# Patient Record
Sex: Female | Born: 1945 | Race: Black or African American | Hispanic: No | Smoking: Never smoker
Health system: Southern US, Community
[De-identification: ages and names within clinical notes are randomized; demographics above are authoritative.]

## PROBLEM LIST (undated history)

## (undated) DIAGNOSIS — N183 Chronic kidney disease, stage 3 unspecified: Secondary | ICD-10-CM

## (undated) DIAGNOSIS — F32A Depression, unspecified: Secondary | ICD-10-CM

## (undated) DIAGNOSIS — M797 Fibromyalgia: Secondary | ICD-10-CM

## (undated) DIAGNOSIS — E559 Vitamin D deficiency, unspecified: Secondary | ICD-10-CM

## (undated) DIAGNOSIS — R0789 Other chest pain: Secondary | ICD-10-CM

## (undated) DIAGNOSIS — S82891A Other fracture of right lower leg, initial encounter for closed fracture: Secondary | ICD-10-CM

## (undated) DIAGNOSIS — E119 Type 2 diabetes mellitus without complications: Secondary | ICD-10-CM

## (undated) DIAGNOSIS — E785 Hyperlipidemia, unspecified: Secondary | ICD-10-CM

## (undated) DIAGNOSIS — F329 Major depressive disorder, single episode, unspecified: Secondary | ICD-10-CM

## (undated) DIAGNOSIS — I10 Essential (primary) hypertension: Secondary | ICD-10-CM

## (undated) DIAGNOSIS — J45909 Unspecified asthma, uncomplicated: Secondary | ICD-10-CM

## (undated) HISTORY — PX: ABDOMINAL HYSTERECTOMY: SHX81

## (undated) HISTORY — DX: Other chest pain: R07.89

## (undated) HISTORY — DX: Vitamin D deficiency, unspecified: E55.9

## (undated) HISTORY — DX: Hyperlipidemia, unspecified: E78.5

## (undated) HISTORY — DX: Fibromyalgia: M79.7

## (undated) HISTORY — DX: Major depressive disorder, single episode, unspecified: F32.9

## (undated) HISTORY — DX: Chronic kidney disease, stage 3 unspecified: N18.30

## (undated) HISTORY — DX: Depression, unspecified: F32.A

## (undated) HISTORY — DX: Chronic kidney disease, stage 3 (moderate): N18.3

---

## 2003-11-10 ENCOUNTER — Other Ambulatory Visit: Payer: Self-pay

## 2005-08-24 ENCOUNTER — Emergency Department: Payer: Self-pay | Admitting: Emergency Medicine

## 2005-08-25 ENCOUNTER — Other Ambulatory Visit: Payer: Self-pay

## 2005-09-12 ENCOUNTER — Other Ambulatory Visit: Payer: Self-pay

## 2005-09-12 ENCOUNTER — Emergency Department: Payer: Self-pay | Admitting: Emergency Medicine

## 2005-09-19 ENCOUNTER — Ambulatory Visit: Payer: Self-pay | Admitting: Family Medicine

## 2006-02-06 ENCOUNTER — Emergency Department: Payer: Self-pay | Admitting: Emergency Medicine

## 2006-06-03 ENCOUNTER — Ambulatory Visit: Payer: Self-pay | Admitting: Gynecology

## 2006-12-31 ENCOUNTER — Emergency Department: Payer: Self-pay | Admitting: Emergency Medicine

## 2007-04-13 ENCOUNTER — Emergency Department: Payer: Self-pay | Admitting: Unknown Physician Specialty

## 2007-06-10 ENCOUNTER — Other Ambulatory Visit: Payer: Self-pay

## 2007-06-10 ENCOUNTER — Emergency Department: Payer: Self-pay | Admitting: Emergency Medicine

## 2007-07-27 ENCOUNTER — Observation Stay: Payer: Self-pay | Admitting: Internal Medicine

## 2007-07-27 ENCOUNTER — Other Ambulatory Visit: Payer: Self-pay

## 2007-12-04 ENCOUNTER — Ambulatory Visit: Payer: Self-pay | Admitting: Family Medicine

## 2007-12-18 ENCOUNTER — Emergency Department: Payer: Self-pay | Admitting: Emergency Medicine

## 2008-01-01 ENCOUNTER — Emergency Department: Payer: Self-pay | Admitting: Emergency Medicine

## 2008-09-17 ENCOUNTER — Ambulatory Visit: Payer: Self-pay | Admitting: Orthopedic Surgery

## 2008-09-21 ENCOUNTER — Ambulatory Visit: Payer: Self-pay | Admitting: Family Medicine

## 2008-10-04 ENCOUNTER — Ambulatory Visit: Payer: Self-pay | Admitting: Family Medicine

## 2008-10-29 ENCOUNTER — Emergency Department: Payer: Self-pay | Admitting: Emergency Medicine

## 2009-01-07 ENCOUNTER — Emergency Department: Payer: Self-pay | Admitting: Emergency Medicine

## 2009-03-22 ENCOUNTER — Ambulatory Visit: Payer: Self-pay | Admitting: Orthopedic Surgery

## 2009-04-11 ENCOUNTER — Encounter: Payer: Self-pay | Admitting: Orthopedic Surgery

## 2009-05-03 ENCOUNTER — Encounter: Payer: Self-pay | Admitting: Orthopedic Surgery

## 2009-06-03 ENCOUNTER — Encounter: Payer: Self-pay | Admitting: Orthopedic Surgery

## 2009-07-07 ENCOUNTER — Ambulatory Visit: Payer: Self-pay | Admitting: Family Medicine

## 2009-08-05 ENCOUNTER — Ambulatory Visit: Payer: Self-pay | Admitting: Orthopedic Surgery

## 2009-09-13 ENCOUNTER — Ambulatory Visit: Payer: Self-pay | Admitting: Family Medicine

## 2009-09-16 ENCOUNTER — Emergency Department: Payer: Self-pay | Admitting: Emergency Medicine

## 2009-12-20 ENCOUNTER — Ambulatory Visit: Payer: Self-pay | Admitting: Internal Medicine

## 2009-12-20 ENCOUNTER — Inpatient Hospital Stay: Payer: Self-pay | Admitting: Internal Medicine

## 2009-12-29 ENCOUNTER — Ambulatory Visit: Payer: Self-pay | Admitting: Orthopedic Surgery

## 2010-02-23 ENCOUNTER — Emergency Department: Payer: Self-pay | Admitting: Emergency Medicine

## 2010-05-22 ENCOUNTER — Ambulatory Visit: Payer: Self-pay | Admitting: Orthopedic Surgery

## 2010-05-31 ENCOUNTER — Ambulatory Visit: Payer: Self-pay | Admitting: Family Medicine

## 2010-09-21 ENCOUNTER — Emergency Department: Payer: Self-pay | Admitting: Emergency Medicine

## 2010-09-28 ENCOUNTER — Emergency Department: Payer: Self-pay | Admitting: Emergency Medicine

## 2010-12-28 ENCOUNTER — Ambulatory Visit: Payer: Self-pay | Admitting: Family Medicine

## 2011-05-23 ENCOUNTER — Ambulatory Visit: Payer: Self-pay | Admitting: Anesthesiology

## 2011-06-19 ENCOUNTER — Ambulatory Visit: Payer: Self-pay | Admitting: Anesthesiology

## 2011-07-24 ENCOUNTER — Ambulatory Visit: Payer: Self-pay | Admitting: Anesthesiology

## 2011-08-08 ENCOUNTER — Ambulatory Visit: Payer: Self-pay | Admitting: Anesthesiology

## 2011-09-03 ENCOUNTER — Ambulatory Visit: Payer: Self-pay | Admitting: Anesthesiology

## 2011-10-29 ENCOUNTER — Ambulatory Visit: Payer: Self-pay | Admitting: Anesthesiology

## 2012-01-01 ENCOUNTER — Ambulatory Visit: Payer: Self-pay | Admitting: Anesthesiology

## 2012-03-05 HISTORY — PX: HAND SURGERY: SHX662

## 2012-03-12 ENCOUNTER — Ambulatory Visit: Payer: Self-pay | Admitting: Anesthesiology

## 2012-05-30 ENCOUNTER — Emergency Department: Payer: Self-pay | Admitting: Unknown Physician Specialty

## 2012-05-30 LAB — COMPREHENSIVE METABOLIC PANEL
Albumin: 3.7 g/dL (ref 3.4–5.0)
Alkaline Phosphatase: 108 U/L (ref 50–136)
BUN: 26 mg/dL — ABNORMAL HIGH (ref 7–18)
Bilirubin,Total: 0.5 mg/dL (ref 0.2–1.0)
Co2: 26 mmol/L (ref 21–32)
Creatinine: 1.58 mg/dL — ABNORMAL HIGH (ref 0.60–1.30)
EGFR (African American): 39 — ABNORMAL LOW
Glucose: 276 mg/dL — ABNORMAL HIGH (ref 65–99)
Osmolality: 285 (ref 275–301)
Potassium: 4.2 mmol/L (ref 3.5–5.1)
Sodium: 135 mmol/L — ABNORMAL LOW (ref 136–145)

## 2012-05-30 LAB — CBC
MCH: 28.9 pg (ref 26.0–34.0)
MCV: 87 fL (ref 80–100)
Platelet: 295 10*3/uL (ref 150–440)
RDW: 14 % (ref 11.5–14.5)

## 2012-05-30 LAB — LIPASE, BLOOD: Lipase: 124 U/L (ref 73–393)

## 2012-07-01 ENCOUNTER — Emergency Department: Payer: Self-pay | Admitting: Emergency Medicine

## 2012-07-25 ENCOUNTER — Emergency Department: Payer: Self-pay | Admitting: Emergency Medicine

## 2012-11-08 ENCOUNTER — Emergency Department: Payer: Self-pay | Admitting: Internal Medicine

## 2013-01-22 ENCOUNTER — Ambulatory Visit: Payer: Self-pay | Admitting: Family Medicine

## 2013-02-11 DIAGNOSIS — E559 Vitamin D deficiency, unspecified: Secondary | ICD-10-CM | POA: Insufficient documentation

## 2013-03-05 HISTORY — PX: OTHER SURGICAL HISTORY: SHX169

## 2013-03-07 ENCOUNTER — Emergency Department: Payer: Self-pay | Admitting: Emergency Medicine

## 2013-03-07 LAB — CBC WITH DIFFERENTIAL/PLATELET
Basophil #: 0.1 10*3/uL (ref 0.0–0.1)
Basophil %: 0.8 %
EOS ABS: 0.3 10*3/uL (ref 0.0–0.7)
Eosinophil %: 3.1 %
HCT: 40.4 % (ref 35.0–47.0)
HGB: 13.6 g/dL (ref 12.0–16.0)
Lymphocyte #: 3.9 10*3/uL — ABNORMAL HIGH (ref 1.0–3.6)
Lymphocyte %: 38.8 %
MCH: 28.8 pg (ref 26.0–34.0)
MCHC: 33.6 g/dL (ref 32.0–36.0)
MCV: 86 fL (ref 80–100)
Monocyte #: 1 x10 3/mm — ABNORMAL HIGH (ref 0.2–0.9)
Monocyte %: 9.7 %
Neutrophil #: 4.7 10*3/uL (ref 1.4–6.5)
Neutrophil %: 47.6 %
Platelet: 334 10*3/uL (ref 150–440)
RBC: 4.72 10*6/uL (ref 3.80–5.20)
RDW: 14.1 % (ref 11.5–14.5)
WBC: 9.9 10*3/uL (ref 3.6–11.0)

## 2013-03-07 LAB — BASIC METABOLIC PANEL
Anion Gap: 6 — ABNORMAL LOW (ref 7–16)
BUN: 26 mg/dL — ABNORMAL HIGH (ref 7–18)
CALCIUM: 9.2 mg/dL (ref 8.5–10.1)
CHLORIDE: 107 mmol/L (ref 98–107)
CO2: 26 mmol/L (ref 21–32)
Creatinine: 1.64 mg/dL — ABNORMAL HIGH (ref 0.60–1.30)
EGFR (African American): 37 — ABNORMAL LOW
GFR CALC NON AF AMER: 32 — AB
GLUCOSE: 168 mg/dL — AB (ref 65–99)
Osmolality: 286 (ref 275–301)
Potassium: 3.9 mmol/L (ref 3.5–5.1)
SODIUM: 139 mmol/L (ref 136–145)

## 2013-03-07 LAB — TROPONIN I: Troponin-I: <0.02 ng/mL

## 2013-03-07 LAB — RAPID INFLUENZA A&B ANTIGENS

## 2013-03-07 LAB — CK: CK, TOTAL: 198 U/L (ref 21–215)

## 2013-03-28 ENCOUNTER — Emergency Department: Payer: Self-pay | Admitting: Emergency Medicine

## 2013-06-16 ENCOUNTER — Ambulatory Visit: Payer: Self-pay | Admitting: Family Medicine

## 2013-06-17 ENCOUNTER — Ambulatory Visit: Payer: Self-pay | Admitting: Family Medicine

## 2013-07-07 ENCOUNTER — Inpatient Hospital Stay: Payer: Self-pay | Admitting: Internal Medicine

## 2013-07-07 LAB — CBC
HCT: 38 % (ref 35.0–47.0)
HGB: 12.8 g/dL (ref 12.0–16.0)
MCH: 29.3 pg (ref 26.0–34.0)
MCHC: 33.8 g/dL (ref 32.0–36.0)
MCV: 87 fL (ref 80–100)
Platelet: 309 10*3/uL (ref 150–440)
RBC: 4.39 10*6/uL (ref 3.80–5.20)
RDW: 14.5 % (ref 11.5–14.5)
WBC: 24.8 10*3/uL — AB (ref 3.6–11.0)

## 2013-07-07 LAB — URINALYSIS, COMPLETE
Bilirubin,UR: NEGATIVE
Glucose,UR: NEGATIVE mg/dL (ref 0–75)
Ketone: NEGATIVE
Nitrite: NEGATIVE
Ph: 5 (ref 4.5–8.0)
Protein: 30
SPECIFIC GRAVITY: 1.018 (ref 1.003–1.030)
WBC UR: 5 /HPF (ref 0–5)

## 2013-07-07 LAB — COMPREHENSIVE METABOLIC PANEL
ALBUMIN: 3.5 g/dL (ref 3.4–5.0)
ANION GAP: 6 — AB (ref 7–16)
AST: 23 U/L (ref 15–37)
Alkaline Phosphatase: 55 U/L
BILIRUBIN TOTAL: 0.6 mg/dL (ref 0.2–1.0)
BUN: 20 mg/dL — AB (ref 7–18)
Calcium, Total: 9.3 mg/dL (ref 8.5–10.1)
Chloride: 102 mmol/L (ref 98–107)
Co2: 28 mmol/L (ref 21–32)
Creatinine: 1.73 mg/dL — ABNORMAL HIGH (ref 0.60–1.30)
EGFR (African American): 34 — ABNORMAL LOW
EGFR (Non-African Amer.): 30 — ABNORMAL LOW
Glucose: 87 mg/dL (ref 65–99)
Osmolality: 274 (ref 275–301)
POTASSIUM: 3.2 mmol/L — AB (ref 3.5–5.1)
SGPT (ALT): 16 U/L (ref 12–78)
SODIUM: 136 mmol/L (ref 136–145)
TOTAL PROTEIN: 7.9 g/dL (ref 6.4–8.2)

## 2013-07-07 LAB — DRUG SCREEN, URINE
Amphetamines, Ur Screen: NEGATIVE (ref ?–1000)
BENZODIAZEPINE, UR SCRN: NEGATIVE (ref ?–200)
Barbiturates, Ur Screen: NEGATIVE (ref ?–200)
Cannabinoid 50 Ng, Ur ~~LOC~~: NEGATIVE (ref ?–50)
Cocaine Metabolite,Ur ~~LOC~~: POSITIVE (ref ?–300)
MDMA (Ecstasy)Ur Screen: NEGATIVE (ref ?–500)
Methadone, Ur Screen: NEGATIVE (ref ?–300)
Opiate, Ur Screen: NEGATIVE (ref ?–300)
Phencyclidine (PCP) Ur S: NEGATIVE (ref ?–25)
Tricyclic, Ur Screen: NEGATIVE (ref ?–1000)

## 2013-07-07 LAB — ETHANOL: Ethanol %: <0.003 % (ref 0.000–0.080)

## 2013-07-08 LAB — CBC WITH DIFFERENTIAL/PLATELET
Basophil #: 0 10*3/uL (ref 0.0–0.1)
Basophil %: 0.2 %
EOS ABS: 0 10*3/uL (ref 0.0–0.7)
EOS PCT: 0.2 %
HCT: 34.7 % — AB (ref 35.0–47.0)
HGB: 11.6 g/dL — ABNORMAL LOW (ref 12.0–16.0)
LYMPHS ABS: 2.7 10*3/uL (ref 1.0–3.6)
Lymphocyte %: 13 %
MCH: 29.4 pg (ref 26.0–34.0)
MCHC: 33.5 g/dL (ref 32.0–36.0)
MCV: 88 fL (ref 80–100)
Monocyte #: 1.6 x10 3/mm — ABNORMAL HIGH (ref 0.2–0.9)
Monocyte %: 7.5 %
Neutrophil #: 16.4 10*3/uL — ABNORMAL HIGH (ref 1.4–6.5)
Neutrophil %: 79.1 %
Platelet: 274 10*3/uL (ref 150–440)
RBC: 3.94 10*6/uL (ref 3.80–5.20)
RDW: 14.5 % (ref 11.5–14.5)
WBC: 20.8 10*3/uL — AB (ref 3.6–11.0)

## 2013-07-08 LAB — BASIC METABOLIC PANEL
Anion Gap: 9 (ref 7–16)
BUN: 25 mg/dL — ABNORMAL HIGH (ref 7–18)
CHLORIDE: 102 mmol/L (ref 98–107)
CO2: 26 mmol/L (ref 21–32)
Calcium, Total: 8.7 mg/dL (ref 8.5–10.1)
Creatinine: 2.89 mg/dL — ABNORMAL HIGH (ref 0.60–1.30)
EGFR (Non-African Amer.): 16 — ABNORMAL LOW
GFR CALC AF AMER: 18 — AB
GLUCOSE: 64 mg/dL — AB (ref 65–99)
Osmolality: 276 (ref 275–301)
Potassium: 2.9 mmol/L — ABNORMAL LOW (ref 3.5–5.1)
SODIUM: 137 mmol/L (ref 136–145)

## 2013-07-08 LAB — CSF CELL CT + PROT + GLU PANEL
CSF TUBE #: 3
Eosinophil: 0 %
Glucose, CSF: 38 mg/dL — ABNORMAL LOW (ref 40–75)
Lymphocytes: 65 %
MONOCYTES/MACROPHAGES: 35 %
Neutrophils: 0 %
Other Cells: 0 %
Protein, CSF: 33 mg/dL (ref 15–45)
RBC (CSF): 14 /mm3
WBC (CSF): 5 /mm3

## 2013-07-09 LAB — CBC WITH DIFFERENTIAL/PLATELET
BASOS PCT: 0.1 %
Basophil #: 0 10*3/uL (ref 0.0–0.1)
Eosinophil #: 0 10*3/uL (ref 0.0–0.7)
Eosinophil %: 0.1 %
HCT: 35.5 % (ref 35.0–47.0)
HGB: 12 g/dL (ref 12.0–16.0)
LYMPHS ABS: 2.2 10*3/uL (ref 1.0–3.6)
LYMPHS PCT: 10.4 %
MCH: 29.4 pg (ref 26.0–34.0)
MCHC: 33.7 g/dL (ref 32.0–36.0)
MCV: 87 fL (ref 80–100)
MONO ABS: 1.6 x10 3/mm — AB (ref 0.2–0.9)
MONOS PCT: 7.8 %
Neutrophil #: 17 10*3/uL — ABNORMAL HIGH (ref 1.4–6.5)
Neutrophil %: 81.6 %
Platelet: 287 10*3/uL (ref 150–440)
RBC: 4.06 10*6/uL (ref 3.80–5.20)
RDW: 14.4 % (ref 11.5–14.5)
WBC: 20.9 10*3/uL — AB (ref 3.6–11.0)

## 2013-07-09 LAB — BASIC METABOLIC PANEL
ANION GAP: 6 — AB (ref 7–16)
BUN: 27 mg/dL — ABNORMAL HIGH (ref 7–18)
CALCIUM: 8.4 mg/dL — AB (ref 8.5–10.1)
CO2: 27 mmol/L (ref 21–32)
Chloride: 106 mmol/L (ref 98–107)
Glucose: 42 mg/dL — ABNORMAL LOW (ref 65–99)
OSMOLALITY: 280 (ref 275–301)
Potassium: 3.3 mmol/L — ABNORMAL LOW (ref 3.5–5.1)
SODIUM: 139 mmol/L (ref 136–145)

## 2013-07-09 LAB — CREATININE, SERUM
Creatinine: 1.91 mg/dL — ABNORMAL HIGH (ref 0.60–1.30)
EGFR (African American): 30 — ABNORMAL LOW
EGFR (Non-African Amer.): 26 — ABNORMAL LOW

## 2013-07-10 LAB — CREATININE, SERUM
Creatinine: 1.6 mg/dL — ABNORMAL HIGH (ref 0.60–1.30)
EGFR (African American): 38 — ABNORMAL LOW
GFR CALC NON AF AMER: 33 — AB

## 2013-07-10 LAB — CBC WITH DIFFERENTIAL/PLATELET
Basophil #: 0.1 10*3/uL (ref 0.0–0.1)
Basophil %: 1.1 %
EOS ABS: 0.1 10*3/uL (ref 0.0–0.7)
Eosinophil %: 0.8 %
HCT: 32.1 % — ABNORMAL LOW (ref 35.0–47.0)
HGB: 10.5 g/dL — AB (ref 12.0–16.0)
LYMPHS ABS: 2.4 10*3/uL (ref 1.0–3.6)
LYMPHS PCT: 17.7 %
MCH: 29 pg (ref 26.0–34.0)
MCHC: 32.8 g/dL (ref 32.0–36.0)
MCV: 88 fL (ref 80–100)
Monocyte #: 1.4 x10 3/mm — ABNORMAL HIGH (ref 0.2–0.9)
Monocyte %: 10 %
Neutrophil #: 9.7 10*3/uL — ABNORMAL HIGH (ref 1.4–6.5)
Neutrophil %: 70.4 %
Platelet: 303 10*3/uL (ref 150–440)
RBC: 3.63 10*6/uL — ABNORMAL LOW (ref 3.80–5.20)
RDW: 14.3 % (ref 11.5–14.5)
WBC: 13.7 10*3/uL — ABNORMAL HIGH (ref 3.6–11.0)

## 2013-07-12 LAB — CULTURE, BLOOD (SINGLE)

## 2013-07-12 LAB — CSF CULTURE

## 2013-07-24 DIAGNOSIS — F1410 Cocaine abuse, uncomplicated: Secondary | ICD-10-CM | POA: Insufficient documentation

## 2013-07-24 HISTORY — DX: Cocaine abuse, uncomplicated: F14.10

## 2013-09-24 DIAGNOSIS — E782 Mixed hyperlipidemia: Secondary | ICD-10-CM | POA: Insufficient documentation

## 2013-09-24 DIAGNOSIS — G8929 Other chronic pain: Secondary | ICD-10-CM | POA: Insufficient documentation

## 2013-09-24 DIAGNOSIS — K219 Gastro-esophageal reflux disease without esophagitis: Secondary | ICD-10-CM | POA: Insufficient documentation

## 2013-10-21 ENCOUNTER — Emergency Department: Payer: Self-pay | Admitting: Emergency Medicine

## 2013-12-10 ENCOUNTER — Emergency Department: Payer: Self-pay | Admitting: Emergency Medicine

## 2014-02-19 ENCOUNTER — Ambulatory Visit: Payer: Self-pay | Admitting: Nurse Practitioner

## 2014-06-21 DIAGNOSIS — G3184 Mild cognitive impairment, so stated: Secondary | ICD-10-CM

## 2014-06-21 HISTORY — DX: Mild cognitive impairment of uncertain or unknown etiology: G31.84

## 2014-06-26 NOTE — H&P (Signed)
PATIENT NAME:  Theresa Booker, LESEMAN MR#:  [id number] DATE OF BIRTH:  [birth year]  DATE OF ADMISSION:  07/07/2013  PRIMARY CARE PHYSICIAN:  Dr. Wynona Meals   REFERRING PHYSICIAN: Dr. Francene Castle  CHIEF COMPLAINT: Seizures.   HISTORY OF PRESENT ILLNESS: The patient is a 69 year old African American female with history of hypertension, hyperlipidemia, diabetes mellitus, cocaine use, seizures, remembers opening the door for her neighbor.  Neighbor found her having generalized tonic-clonic seizures. Concerning this, EMS was called and was brought to the Emergency Department. Work-up in the Emergency Department, the patient was found to have fever of 101.2, tachycardic. The patient is found to have white blood cell count of 25,000. Chest x-ray and urinalysis unremarkable.  CT head without contrast did not show any obvious abnormality. Could not obtain any history from the patient as the patient is quite somnolent.   PAST MEDICAL HISTORY:  1. Diabetes mellitus type 2.  2. Gastroesophageal reflux disease.  3. Hypertension.  4. Hyperlipidemia.  5. Chronic back pain.  6. Asthma.  7. Chronic kidney disease.   PAST SURGICAL HISTORY:  1. Right knee surgery.  2. Rotator cuff surgery.  3. Hysterectomy.   ALLERGIES: No known drug allergies.   HOME MEDICATIONS:  1. ProAir 2 puffs every 4 hours. . 3. Omeprazole 20 mg once a day.  4. Losartan 25 mg a day once a day.  5. Lotensin 10 mg a day.  6. Lantus 43 units once a day.  7. Januvia 50 mg daily.  8. Glipizide 10 mg 2 times a day.  9. Aspirin 81 mg daily.   SOCIAL HISTORY: The patient states lives by herself. could not obtain social history, family history, review of systems as patient has been falling asleep frequently.   PHYSICAL EXAMINATION:  GENERAL: This is a well-built, well-nourished, age-appropriate female lying down in the bed, somnolent.  VITAL SIGNS: Temperature 101.2, pulse 82, blood pressure 122/81, respiratory rate of 18, oxygen  saturation is 96% on room air.   HEENT: Head normocephalic, atraumatic. No scleral icterus.  Conjunctivae normal. Pupils equal, round, and react to light. I could not examine the extraocular movements. Could not examine oropharynx. Was not cooperative to examine the oropharynx.  NECK: Supple. No lymphadenopathy. No JVD. No carotid bruit. No thyromegaly.  CHEST: Has no focal tenderness.  Lungs bilaterally clear to auscultation.    HEART: S1 and S2 regular, tachycardia.  ABDOMEN: Bowel sounds plus soft, nontender, nondistended. Could not appreciate any hepatosplenomegaly.  EXTREMITIES: No pedal edema. Pulses 2+.  NEUROLOGIC: The patient is somnolent,, arousable. Answers simple questions appropriately. No apparent cranial nerve abnormalities. Could not examine the motor and sensory as the patient is somnolent.  SKIN: No rash or lesions.  MUSCULOSKELETAL: Could not examine the patient is somnolent.   LABORATORY AND RADIOLOGICAL DATA:  CT head without contrast: No acute intracranial abnormality.  Chest x-ray, one view portable: No acute process.  UA negative for nitrites and leukocyte esterase. CMP is complete. BUN 20, creatinine of 1.73.  The rest of all the values are within normal limits.  CBC: WBC of 25,000, hemoglobin 12.8, platelet count of 319.000.   ASSESSMENT AND PLAN:  The patient is a 69 year old female with a known history of cocaine abuse, seizure disorder, comes to the Emergency Department after having an episode of seizures.  1. [place] Seizures.  No previous history of seizures. Most likely cocaine might have decreased the threshold for the seizures.  Get EEG.  Keep the patient on Keppra. Keep  the patient on seizure precautions.  2. Diabetes mellitus, insulin-dependent. Continue the home medications, on glipizide, Januvia, and Lantus.   3. Hypertension: Continue with losartan.  4. Cocaine abuse. Will need further counseling with the patient.  5. Patient on deep vein thrombosis  prophylaxis with Lovenox.   TIME SPENT: 50 minutes.    ____________________________ Monica Becton, MD pv:dd D: 07/07/2013 21:39:18 ET T: 07/07/2013 22:49:39 ET JOB#: [id number]  cc: Monica Becton, MD, <Dictator> Monica Becton MD ELECTRONICALLY SIGNED 07/08/2013 4:09

## 2014-06-26 NOTE — Discharge Summary (Signed)
PATIENT NAME:  Theresa Booker, Theresa Booker MR#:  [id number] DATE OF BIRTH:  07/14/1945  DATE OF ADMISSION:  07/07/2013  DATE OF DISCHARGE:  07/10/2013  PRIMARY CARE PHYSICIAN: Dr. Tomasita Morrow  DISCHARGE DIAGNOSES: 1.  Syncope due to fever.  2.  Viral fever.  3.  Hypertension.  4.  Diabetes mellitus, type 2.  5.  Hyperlipidemia.  6.  Acute-on-chronic renal failure with chronic kidney disease, stage3.   7.  Hypokalemia.   DISCHARGE MEDICATIONS:  1.  Januvia 50 mg p.o. daily.  2.  Glipizide 10 mg p.o. b.i.d.  3.  Losartan 25 mg p.o. daily.  4.  lotensin 10 mg p.o. daily.  5.  Loratadine 10 mg p.o. daily.  6.  Aspirin 81 mg p.o. daily. 7.  Lantus 100 units/mL, 43 units at bedtime.  8.  ProAir 2 puffs four times daily.   DIET: Mechanical soft diet with thin liquids.    CONSULTATIONS: None.   HOSPITAL COURSE: Syncope. A 69 year old female patient who lives alone, brought in because of possible seizure.  > generalized seizure as documented in the history and physical, but upon further questioning with the patient's neighbors, they found her in a passed out kind of state. The patient was brought in by EMS. The patient was found to have fever of 101.2, and she was tachycardic, with white count of 25,000. The patient's chest x-ray and CT head were unremarkable. The patient was lethargic on admission. The patient was admitted to hospitalist service for possible new-onset seizure. The patient was given Keppra and seizure precautions were followed, but the patient did have syncope instead of seizures. Based on further questioning with the patient, she said that she could not eat for 3 or 4 days because she was feeling sick and nauseous, then suddenly passed out. Her urine toxicology was positive for cocaine. She told me that she never used cocaine before, so I believe it is a combination of taking the cocaine and not feeling well with fever, she might have passed out. She did not have further seizures here,  and she has no history of seizures. The patient did have a lumbar puncture because of the fever on initial presentation, but the lumbar puncture did not show any growth, and the patient was kept initially n.p.o. because of her mental confusion and lethargy. We gave her fluids and IV antibiotics. The patient also was using cocaine, so we thought maybe her altered mental status is secondary to cocaine as well. Speech saw the patient, initially kept n.p.o. Later on, her mental status improved nicely, was able to talk and communicate. The patient was started on a dysphagia diet. Blood cultures have been negative, and the patient's white count also improved to 13.8. I explained that she should not use drugs any more and follow up with Dr. Tomasita Morrow as an outpatient. The patient's potassium also was replaced, and the patient had insulin pack. The patient went home in stable condition.   LABORATORY DATA DURING HOSPITAL STAY: EKG showed sinus tachycardia, 105 beats per minute on admission. Blood cultures negative. Urine toxicology positive for cocaine. White count on admission 24.8, hemoglobin 12.8, hematocrit 38, platelets 309. Electrolytes: Sodium 136, potassium 3.0, chloride is 102, bicarb 28, BUN 20, creatinine 1.73, glucose 87. The patient's CSF cultures were negative.   DISCHARGE VITAL SIGNS: Temperature 98.8, heart rate 72, blood pressure 130/90. Hr 74, sat 98% on room air.    DISCHARGE PHYSICAL EXAMINATION:  GENERAL: Alert, awake, oriented.  HEAD: Normocephalic, atraumatic.  EYES: Pupils equal, reacting to light. Extraocular movements are intact.  ENT: No tympanic membrane congestion. No turbinate hypertrophy.no oropharyngeal erythema.Marland Kitchen NECK: Supple. No JVD. No carotid bruit.  NEUROLOGIC: Exam was within normal limits.   The patient did receive vancomycin and Zosyn while she was here, but we did not find a reason for elevated white count. X-rays negative, cultures were negative, so I stopped the  antibiotics. She did not go home with any antibiotics.   TIME SPENT: More than 30 minutes.    ____________________________ Epifanio Lesches, MD sk:mr D: 07/12/2013 08:31:00 ET T: 07/12/2013 20:40:32 ET JOB#: [id number]  cc: Epifanio Lesches, MD, <Dictator> Epifanio Lesches MD ELECTRONICALLY SIGNED 07/28/2013 22:20

## 2014-06-27 NOTE — H&P (Signed)
PATIENT NAME:  Theresa Booker MR#:  [id number] DATE OF BIRTH:  05-02-1945  DATE OF ADMISSION:  05/23/2011  CHIEF COMPLAINT: Diffuse body pain.   PROCEDURE: None.   HISTORY OF PRESENT ILLNESS: Theresa Booker is a pleasant 69 year old black female with a long-standing history of diffuse body pain present for over two years referred by [facility] for evaluation and treatment. She has most recently seen Dr. Delight Stare who has evaluated her for rheumatoid arthritis and degenerative arthritis and she had a negative rheumatoid arthritis and autoimmune work-up. She has persistent diffuse pain involving the shoulders, front of the shoulders, back of the shoulders, wrists, elbows, knees, and lower legs. She states that the pain is present throughout the day, has gradually gotten worse and is not influenced by time of day. It is worse with sitting, standing, squatting, twisting, and better with sleeping and pain medication. She has been on Nucynta in the past via [facility] that gave her relief but caused nausea and vomiting. She has also been on Cymbalta and she discontinued this recently. This did not help. She has been on a trial of other medications without significant improvement and failed these conservative therapies. She also has associated nighttime cramps, fatigue, inability to concentrate, and problems with her bladder. She also has occasional and intermittent weakness affecting the lower extremities when she is trying to be up and mobile. She describes the pain as cramping, deep, disabling, and distressing.   PAST MEDICAL HISTORY:  1. History of abnormal heart rhythm, high blood pressure, and chest pain.  2. History of bronchitis and shortness of breath.  3. History of reflux. 4. History of kidney disease. 5. History of diabetes.  6. Renal insufficiency. 7. Atypical chest pain. 8. Depression.   PAST SURGICAL HISTORY:  1. Vaginal hysterectomy. 2. Left shoulder surgery. 3. Right knee scope.    SOCIAL HISTORY: She is divorced with one child, never smoked. She does not use alcohol. She denies any addiction history or medication misuse.   FAMILY HISTORY: Significant for chronic pain, cancer, diabetes, and high blood pressure.   PHYSICAL EXAMINATION: Physical examination reveals a black female who appears in no acute distress. She is alert and oriented x3, cooperative and compliant. Heart has regular rate and rhythm without murmur. Lungs are clear to auscultation without wheezes or rales. Pupils are equally round and reactive to light. Extraocular muscles are intact. Lower extremity strength appears to be stable. She is able to ambulate, but does have a slightly antalgic gait. Her muscle tone and bulk is good. She has tenderness over the anterior clavicular region as well as in the posterior trapezius, front of the thighs, and tenderness around the dorsum of the wrists bilaterally and lower extremities. She has tenderness in the low back region.   ASSESSMENT:  1. Diffuse body pain with intractable pain and fibromyalgia.  2. Diffuse degenerative arthritis.  3. Depression.   PLAN: I have had a long discussion with Ms. Rabine regarding her care and she has failed conservative therapy. She has been through [place] with some side effects and has failed more conservative therapy, in addition to SSRIs. I have discussed this with her [facility] physician and feel that a low dose of Duragesic would be appropriate. We will start her on 25 mcg patch. I have gone over the risks, benefits, and benefits of the medication with her in full detail. We will have her return to the Clinic in one month for reevaluation and we will  see her for approximately 3 to 4 months to try to get her on a stable regimen and refer her back to the [facility]. ____________________________ Alvina Filbert. Andree Elk, MD jga:slb D: 05/23/2011 15:39:17 ET     T: 05/23/2011 16:32:44 ET        JOB#: [id number] cc: Alvina Filbert.  Andree Elk, MD, <Dictator> Marguerita Merles, MD Alvina Filbert Abdulraheem Pineo MD ELECTRONICALLY SIGNED 05/29/2011 10:04

## 2014-08-12 ENCOUNTER — Other Ambulatory Visit: Payer: Self-pay | Admitting: Nurse Practitioner

## 2014-08-12 ENCOUNTER — Ambulatory Visit
Admission: RE | Admit: 2014-08-12 | Discharge: 2014-08-12 | Disposition: A | Discharge status: Home / Self Care | Payer: Medicare Other | Source: Ambulatory Visit | Attending: Nurse Practitioner | Admitting: Nurse Practitioner

## 2014-08-12 DIAGNOSIS — M1711 Unilateral primary osteoarthritis, right knee: Secondary | ICD-10-CM | POA: Insufficient documentation

## 2014-08-12 DIAGNOSIS — M25561 Pain in right knee: Secondary | ICD-10-CM

## 2014-09-12 ENCOUNTER — Encounter: Payer: Self-pay | Admitting: Emergency Medicine

## 2014-09-12 ENCOUNTER — Emergency Department: Payer: Medicare Other

## 2014-09-12 ENCOUNTER — Emergency Department
Admission: EM | Admit: 2014-09-12 | Discharge: 2014-09-12 | Disposition: A | Discharge status: Home / Self Care | Payer: Medicare Other | Attending: Emergency Medicine | Admitting: Emergency Medicine

## 2014-09-12 DIAGNOSIS — I10 Essential (primary) hypertension: Secondary | ICD-10-CM | POA: Diagnosis not present

## 2014-09-12 DIAGNOSIS — M1712 Unilateral primary osteoarthritis, left knee: Secondary | ICD-10-CM | POA: Insufficient documentation

## 2014-09-12 DIAGNOSIS — E119 Type 2 diabetes mellitus without complications: Secondary | ICD-10-CM | POA: Insufficient documentation

## 2014-09-12 DIAGNOSIS — M25561 Pain in right knee: Secondary | ICD-10-CM | POA: Diagnosis present

## 2014-09-12 HISTORY — DX: Essential (primary) hypertension: I10

## 2014-09-12 HISTORY — DX: Type 2 diabetes mellitus without complications: E11.9

## 2014-09-12 MED ORDER — MELOXICAM 7.5 MG PO TABS: 7.5000 mg | ORAL_TABLET | Freq: Every day | ORAL | Status: DC

## 2014-09-12 MED ORDER — TRAMADOL HCL 50 MG PO TABS: 50.0000 mg | ORAL_TABLET | Freq: Four times a day (QID) | ORAL | Status: DC | PRN

## 2014-09-12 NOTE — ED Provider Notes (Signed)
[facility] Emergency Department Provider Note  ____________________________________________  Time seen: Approximately 3:48 PM  I have reviewed the triage vital signs and the nursing notes.   HISTORY  Chief Complaint Knee Pain    HPI Theresa Booker is a 69 y.o. female patient complain of pain and right medial knee swelling for 2 days. Patient stated there is no history of trauma. Patient states she ambulates with the use of a cane secondary to her chronic right knee problems. Patient says that all through is already discussed knee replacement for the right knee. She is rating his new onset of left knee pain is 8/10. Patient denies any dyspnea or chest pain.   Past Medical History  Diagnosis Date  . Diabetes mellitus without complication   . Hypertension     There are no active problems to display for this patient.   History reviewed. No pertinent past surgical history.  Current Outpatient Rx  Name  Route  Sig  Dispense  Refill  . meloxicam (MOBIC) 7.5 MG tablet   Oral   Take 1 tablet (7.5 mg total) by mouth daily.   10 tablet   2   . traMADol (ULTRAM) 50 MG tablet   Oral   Take 1 tablet (50 mg total) by mouth every 6 (six) hours as needed.   20 tablet   0     Allergies Review of patient's allergies indicates no known allergies.  No family history on file.  Social History History  Substance Use Topics  . Smoking status: Never Smoker   . Smokeless tobacco: Not on file  . Alcohol Use: Not on file    Review of Systems Constitutional: No fever/chills Eyes: No visual changes. ENT: No sore throat. Cardiovascular: Denies chest pain. Respiratory: Denies shortness of breath. Gastrointestinal: No abdominal pain.  No nausea, no vomiting.  No diarrhea.  No constipation. Genitourinary: Negative for dysuria. Musculoskeletal: Negative for back pain. Left medial knee pain. Skin: Negative for rash. Neurological: Negative for headaches, focal  weakness or numbness. Endocrine: Hypertension and diabetes. 10-point ROS otherwise negative.  ____________________________________________   PHYSICAL EXAM:  VITAL SIGNS: ED Triage Vitals  Enc Vitals Group     BP 09/12/14 1520 124/87 mmHg     Pulse Rate 09/12/14 1520 74     Resp 09/12/14 1520 20     Temp 09/12/14 1520 97.9 F (36.6 C)     Temp Source 09/12/14 1520 Oral     SpO2 09/12/14 1520 95 %     Weight 09/12/14 1520 160 lb (72.576 kg)     Height 09/12/14 1520 5' (1.524 m)     Head Cir --      Peak Flow --      Pain Score 09/12/14 1520 8     Pain Loc --      Pain Edu? --      Excl. in [place]? --   Constitutional: Alert and oriented. Well appearing and in no acute distress. Eyes: Conjunctivae are normal. PERRL. EOMI. Head: Atraumatic. Nose: No congestion/rhinnorhea. Mouth/Throat: Mucous membranes are moist.  Oropharynx non-erythematous. Neck: No stridor.  No cervical spine tenderness to palpation. Hematological/Lymphatic/Immunilogical: No cervical lymphadenopathy. Cardiovascular: Normal rate, regular rhythm. Grossly normal heart sounds.  Good peripheral circulation. Respiratory: Normal respiratory effort.  No retractions. Lungs CTAB. Gastrointestinal: Soft and nontender. No distention. No abdominal bruits. No CVA tenderness. Musculoskeletal: No obvious deformity of the left knee. Medial swelling. Tender palpation at sensation point of the medial collateral ligament. Patient  also has some guarding at the femoral patella joint. Patient had passive full nuchal range of motion. Moderate crepitus at the anterior patella. Neurologic:  Normal speech and language. No gross focal neurologic deficits are appreciated. Speech is normal. No gait instability. Skin:  Skin is warm, dry and intact. No rash noted. Psychiatric: Mood and affect are normal. Speech and behavior are normal.  ____________________________________________   LABS (all labs ordered are listed, but only abnormal  results are displayed)  Labs Reviewed - No data to display ____________________________________________  EKG   ____________________________________________  RADIOLOGY  No acute findings some moderate joint space narrowing on the medial aspect of the femoral tibial joint space. I, Sable Feil, personally viewed and evaluated these images as part of my medical decision making.   ____________________________________________   PROCEDURES  Procedure(s) performed: None  Critical Care performed: No  ____________________________________________   INITIAL IMPRESSION / ASSESSMENT AND PLAN / ED COURSE  Pertinent labs & imaging results that were available during my care of the patient were reviewed by me and considered in my medical decision making (see chart for details).  Left medial knee pain. Advised patient to follow orthopedic doctor who is Dr.Menz. Advised patient continued to ambulate with support. Patient given prescription 7.5 mg of morbid take as directed. Patient also given a prescription for tramadol to take as needed for acute pain. ____________________________________________   FINAL CLINICAL IMPRESSION(S) / ED DIAGNOSES  Final diagnoses:  Primary osteoarthritis of left knee      Sable Feil, PA-C 09/12/14 [place], PA-C 09/12/14 1644  Hinda Kehr, MD 09/12/14 2347

## 2014-09-12 NOTE — Discharge Instructions (Signed)
Arthritis, Nonspecific °Arthritis is inflammation of a joint. This usually means pain, redness, warmth or swelling are present. One or more joints may be involved. There are a number of types of arthritis. Your caregiver may not be able to tell what type of arthritis you have right away. °CAUSES  °The most common cause of arthritis is the wear and tear on the joint (osteoarthritis). This causes damage to the cartilage, which can break down over time. The knees, hips, back and neck are most often affected by this type of arthritis. °Other types of arthritis and common causes of joint pain include: °· Sprains and other injuries near the joint. Sometimes minor sprains and injuries cause pain and swelling that develop hours later. °· Rheumatoid arthritis. This affects hands, feet and knees. It usually affects both sides of your body at the same time. It is often associated with chronic ailments, fever, weight loss and general weakness. °· Crystal arthritis. Gout and pseudo gout can cause occasional acute severe pain, redness and swelling in the foot, ankle, or knee. °· Infectious arthritis. Bacteria can get into a joint through a break in overlying skin. This can cause infection of the joint. Bacteria and viruses can also spread through the blood and affect your joints. °· Drug, infectious and allergy reactions. Sometimes joints can become mildly painful and slightly swollen with these types of illnesses. °SYMPTOMS  °· Pain is the main symptom. °· Your joint or joints can also be red, swollen and warm or hot to the touch. °· You may have a fever with certain types of arthritis, or even feel overall ill. °· The joint with arthritis will hurt with movement. Stiffness is present with some types of arthritis. °DIAGNOSIS  °Your caregiver will suspect arthritis based on your description of your symptoms and on your exam. Testing may be needed to find the type of arthritis: °· Blood and sometimes urine tests. °· X-ray tests  and sometimes CT or MRI scans. °· Removal of fluid from the joint (arthrocentesis) is done to check for bacteria, crystals or other causes. Your caregiver (or a specialist) will numb the area over the joint with a local anesthetic, and use a needle to remove joint fluid for examination. This procedure is only minimally uncomfortable. °· Even with these tests, your caregiver may not be able to tell what kind of arthritis you have. Consultation with a specialist (rheumatologist) may be helpful. °TREATMENT  °Your caregiver will discuss with you treatment specific to your type of arthritis. If the specific type cannot be determined, then the following general recommendations may apply. °Treatment of severe joint pain includes: °· Rest. °· Elevation. °· Anti-inflammatory medication (for example, ibuprofen) may be prescribed. Avoiding activities that cause increased pain. °· Only take over-the-counter or prescription medicines for pain and discomfort as recommended by your caregiver. °· Cold packs over an inflamed joint may be used for 10 to 15 minutes every hour. Hot packs sometimes feel better, but do not use overnight. Do not use hot packs if you are diabetic without your caregiver's permission. °· A cortisone shot into arthritic joints may help reduce pain and swelling. °· Any acute arthritis that gets worse over the next 1 to 2 days needs to be looked at to be sure there is no joint infection. °Long-term arthritis treatment involves modifying activities and lifestyle to reduce joint stress jarring. This can include weight loss. Also, exercise is needed to nourish the joint cartilage and remove waste. This helps keep the muscles   around the joint strong. °HOME CARE INSTRUCTIONS  °· Do not take aspirin to relieve pain if gout is suspected. This elevates uric acid levels. °· Only take over-the-counter or prescription medicines for pain, discomfort or fever as directed by your caregiver. °· Rest the joint as much as  possible. °· If your joint is swollen, keep it elevated. °· Use crutches if the painful joint is in your leg. °· Drinking plenty of fluids may help for certain types of arthritis. °· Follow your caregiver's dietary instructions. °· Try low-impact exercise such as: °¨ Swimming. °¨ Water aerobics. °¨ Biking. °¨ Walking. °· Morning stiffness is often relieved by a warm shower. °· Put your joints through regular range-of-motion. °SEEK MEDICAL CARE IF:  °· You do not feel better in 24 hours or are getting worse. °· You have side effects to medications, or are not getting better with treatment. °SEEK IMMEDIATE MEDICAL CARE IF:  °· You have a fever. °· You develop severe joint pain, swelling or redness. °· Many joints are involved and become painful and swollen. °· There is severe back pain and/or leg weakness. °· You have loss of bowel or bladder control. °Document Released: 03/29/2004 Document Revised: 05/14/2011 Document Reviewed: 04/14/2008 °Exit[employer]® Patient Information ©2015 [employer]. This information is not intended to replace advice given to you by your health care provider. Make sure you discuss any questions you have with your health care provider. ° °

## 2014-09-12 NOTE — ED Notes (Signed)
AAOx3.  Skin warm and dry.  NAD.  Ambulates with steady gait, uses cane.  D/C home

## 2014-09-12 NOTE — ED Notes (Signed)
C/o right knee pain and swelling.  Denies injury. Slight Swelling noted to right knee medially.

## 2014-09-12 NOTE — ED Notes (Signed)
Patient presents to the ED with right knee swelling, tenderness and pain x 2 days.  Patient denies any known injury to her knee.  Patient is in no obvious distress at this time.  Walking with a cane.  Patient denies any history of blood clots.

## 2014-10-18 DIAGNOSIS — R413 Other amnesia: Secondary | ICD-10-CM | POA: Insufficient documentation

## 2014-11-30 ENCOUNTER — Emergency Department
Admission: EM | Admit: 2014-11-30 | Discharge: 2014-11-30 | Disposition: A | Discharge status: Home / Self Care | Payer: Medicare Other | Attending: Emergency Medicine | Admitting: Emergency Medicine

## 2014-11-30 ENCOUNTER — Emergency Department: Payer: Medicare Other

## 2014-11-30 ENCOUNTER — Encounter: Payer: Self-pay | Admitting: Emergency Medicine

## 2014-11-30 DIAGNOSIS — S299XXA Unspecified injury of thorax, initial encounter: Secondary | ICD-10-CM | POA: Diagnosis not present

## 2014-11-30 DIAGNOSIS — Y998 Other external cause status: Secondary | ICD-10-CM | POA: Insufficient documentation

## 2014-11-30 DIAGNOSIS — S76011A Strain of muscle, fascia and tendon of right hip, initial encounter: Secondary | ICD-10-CM

## 2014-11-30 DIAGNOSIS — Y92410 Unspecified street and highway as the place of occurrence of the external cause: Secondary | ICD-10-CM | POA: Diagnosis not present

## 2014-11-30 DIAGNOSIS — S161XXA Strain of muscle, fascia and tendon at neck level, initial encounter: Secondary | ICD-10-CM | POA: Insufficient documentation

## 2014-11-30 DIAGNOSIS — S46911A Strain of unspecified muscle, fascia and tendon at shoulder and upper arm level, right arm, initial encounter: Secondary | ICD-10-CM | POA: Insufficient documentation

## 2014-11-30 DIAGNOSIS — S8001XA Contusion of right knee, initial encounter: Secondary | ICD-10-CM | POA: Diagnosis not present

## 2014-11-30 DIAGNOSIS — Z791 Long term (current) use of non-steroidal anti-inflammatories (NSAID): Secondary | ICD-10-CM | POA: Insufficient documentation

## 2014-11-30 DIAGNOSIS — Y9389 Activity, other specified: Secondary | ICD-10-CM | POA: Diagnosis not present

## 2014-11-30 DIAGNOSIS — I10 Essential (primary) hypertension: Secondary | ICD-10-CM | POA: Insufficient documentation

## 2014-11-30 DIAGNOSIS — S199XXA Unspecified injury of neck, initial encounter: Secondary | ICD-10-CM | POA: Diagnosis present

## 2014-11-30 DIAGNOSIS — E119 Type 2 diabetes mellitus without complications: Secondary | ICD-10-CM | POA: Diagnosis not present

## 2014-11-30 MED ORDER — ONDANSETRON 4 MG PO TBDP
4.0000 mg | ORAL_TABLET | Freq: Once | ORAL | Status: AC
  Administered 2014-11-30: 4 mg via ORAL
  Filled 2014-11-30: qty 1

## 2014-11-30 MED ORDER — CYCLOBENZAPRINE HCL 10 MG PO TABS: 10.0000 mg | ORAL_TABLET | Freq: Every day | ORAL | Status: AC

## 2014-11-30 MED ORDER — MORPHINE SULFATE (PF) 4 MG/ML IV SOLN
4.0000 mg | Freq: Once | INTRAVENOUS | Status: AC
  Administered 2014-11-30: 4 mg via INTRAMUSCULAR
  Filled 2014-11-30: qty 1

## 2014-11-30 MED ORDER — IBUPROFEN 800 MG PO TABS
800.0000 mg | ORAL_TABLET | Freq: Once | ORAL | Status: AC
  Administered 2014-11-30: 800 mg via ORAL
  Filled 2014-11-30: qty 1

## 2014-11-30 NOTE — ED Notes (Signed)
Pt taken to CT at this time.
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
## 2014-11-30 NOTE — Discharge Instructions (Signed)
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
## 2014-12-01 ENCOUNTER — Encounter: Payer: Self-pay | Admitting: Emergency Medicine

## 2014-12-01 DIAGNOSIS — E119 Type 2 diabetes mellitus without complications: Secondary | ICD-10-CM | POA: Diagnosis not present

## 2014-12-01 DIAGNOSIS — S79911A Unspecified injury of right hip, initial encounter: Secondary | ICD-10-CM | POA: Diagnosis not present

## 2014-12-01 DIAGNOSIS — Y9389 Activity, other specified: Secondary | ICD-10-CM | POA: Diagnosis not present

## 2014-12-01 DIAGNOSIS — Y92410 Unspecified street and highway as the place of occurrence of the external cause: Secondary | ICD-10-CM | POA: Diagnosis not present

## 2014-12-01 DIAGNOSIS — Y998 Other external cause status: Secondary | ICD-10-CM | POA: Insufficient documentation

## 2014-12-01 DIAGNOSIS — I10 Essential (primary) hypertension: Secondary | ICD-10-CM | POA: Diagnosis not present

## 2014-12-01 DIAGNOSIS — S3992XA Unspecified injury of lower back, initial encounter: Secondary | ICD-10-CM | POA: Diagnosis not present

## 2014-12-01 DIAGNOSIS — S4991XA Unspecified injury of right shoulder and upper arm, initial encounter: Secondary | ICD-10-CM | POA: Diagnosis not present

## 2014-12-01 DIAGNOSIS — S199XXA Unspecified injury of neck, initial encounter: Secondary | ICD-10-CM | POA: Diagnosis present

## 2014-12-01 DIAGNOSIS — S3991XA Unspecified injury of abdomen, initial encounter: Secondary | ICD-10-CM | POA: Diagnosis not present
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
## 2014-12-02 ENCOUNTER — Emergency Department: Payer: No Typology Code available for payment source

## 2014-12-02 ENCOUNTER — Emergency Department
Admission: EM | Admit: 2014-12-02 | Discharge: 2014-12-02 | Disposition: A | Discharge status: Home / Self Care | Payer: No Typology Code available for payment source | Attending: Emergency Medicine | Admitting: Emergency Medicine

## 2014-12-02 DIAGNOSIS — M7918 Myalgia, other site: Secondary | ICD-10-CM

## 2014-12-02 HISTORY — DX: Unspecified asthma, uncomplicated: J45.909

## 2014-12-02 LAB — COMPREHENSIVE METABOLIC PANEL
ALK PHOS: 53 U/L (ref 38–126)
ALT: 17 U/L (ref 14–54)
ANION GAP: 6 (ref 5–15)
AST: 27 U/L (ref 15–41)
Albumin: 3.7 g/dL (ref 3.5–5.0)
BUN: 32 mg/dL — ABNORMAL HIGH (ref 6–20)
CO2: 26 mmol/L (ref 22–32)
CREATININE: 1.63 mg/dL — AB (ref 0.44–1.00)
Calcium: 9.6 mg/dL (ref 8.9–10.3)
Chloride: 109 mmol/L (ref 101–111)
GFR calc Af Amer: 36 mL/min — ABNORMAL LOW (ref >60)
GFR, EST NON AFRICAN AMERICAN: 31 mL/min — AB (ref >60)
Glucose, Bld: 113 mg/dL — ABNORMAL HIGH (ref 65–99)
Potassium: 3.6 mmol/L (ref 3.5–5.1)
Sodium: 141 mmol/L (ref 135–145)
TOTAL PROTEIN: 7.6 g/dL (ref 6.5–8.1)
Total Bilirubin: 0.5 mg/dL (ref 0.3–1.2)

## 2014-12-02 LAB — CBC
HCT: 36.8 % (ref 35.0–47.0)
HEMOGLOBIN: 12.3 g/dL (ref 12.0–16.0)
MCH: 28.8 pg (ref 26.0–34.0)
MCHC: 33.3 g/dL (ref 32.0–36.0)
MCV: 86.6 fL (ref 80.0–100.0)
Platelets: 299 10*3/uL (ref 150–440)
RBC: 4.26 MIL/uL (ref 3.80–5.20)
RDW: 14.2 % (ref 11.5–14.5)
WBC: 9.4 10*3/uL (ref 3.6–11.0)

## 2014-12-02 MED ORDER — OXYCODONE-ACETAMINOPHEN 5-325 MG PO TABS: 1.0000 | ORAL_TABLET | ORAL | Status: DC | PRN

## 2014-12-02 MED ORDER — IOHEXOL 300 MG/ML  SOLN
75.0000 mL | Freq: Once | INTRAMUSCULAR | Status: AC | PRN
  Administered 2014-12-02: 75 mL via INTRAVENOUS

## 2014-12-02 MED ORDER — ONDANSETRON HCL 4 MG/2ML IJ SOLN
4.0000 mg | Freq: Once | INTRAMUSCULAR | Status: AC
  Administered 2014-12-02: 4 mg via INTRAVENOUS
  Filled 2014-12-02: qty 2

## 2014-12-02 MED ORDER — MORPHINE SULFATE (PF) 4 MG/ML IV SOLN
4.0000 mg | Freq: Once | INTRAVENOUS | Status: AC
Start: 2014-12-02 — End: 2014-12-02
  Administered 2014-12-02: 4 mg via INTRAVENOUS
  Filled 2014-12-02: qty 1

## 2014-12-02 NOTE — Discharge Instructions (Signed)

## 2014-12-02 NOTE — ED Notes (Signed)
MD at bedside for reeval
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
## 2015-01-04 ENCOUNTER — Ambulatory Visit: Payer: Medicare Other | Attending: Preventative Medicine

## 2015-01-04 VITALS — BP 137/73 | HR 57

## 2015-01-04 DIAGNOSIS — M1990 Unspecified osteoarthritis, unspecified site: Secondary | ICD-10-CM | POA: Insufficient documentation

## 2015-01-04 DIAGNOSIS — M25561 Pain in right knee: Secondary | ICD-10-CM | POA: Insufficient documentation

## 2015-01-04 DIAGNOSIS — M25551 Pain in right hip: Secondary | ICD-10-CM | POA: Insufficient documentation

## 2015-01-04 DIAGNOSIS — M545 Low back pain: Secondary | ICD-10-CM | POA: Diagnosis present

## 2015-01-04 DIAGNOSIS — M25511 Pain in right shoulder: Secondary | ICD-10-CM | POA: Diagnosis present

## 2015-01-04 DIAGNOSIS — G894 Chronic pain syndrome: Secondary | ICD-10-CM

## 2015-01-04 DIAGNOSIS — M5030 Other cervical disc degeneration, unspecified cervical region: Secondary | ICD-10-CM | POA: Insufficient documentation

## 2015-01-04 DIAGNOSIS — M7062 Trochanteric bursitis, left hip: Secondary | ICD-10-CM | POA: Diagnosis present

## 2015-01-04 DIAGNOSIS — M542 Cervicalgia: Secondary | ICD-10-CM | POA: Diagnosis present

## 2015-01-04 NOTE — Patient Instructions (Signed)
Please see plan of care
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
## 2015-01-05 ENCOUNTER — Emergency Department: Payer: Medicare Other

## 2015-01-05 ENCOUNTER — Encounter: Payer: Self-pay | Admitting: Emergency Medicine

## 2015-01-05 ENCOUNTER — Emergency Department
Admission: EM | Admit: 2015-01-05 | Discharge: 2015-01-05 | Disposition: A | Discharge status: Home / Self Care | Payer: Medicare Other | Attending: Emergency Medicine | Admitting: Emergency Medicine

## 2015-01-05 DIAGNOSIS — N183 Chronic kidney disease, stage 3 (moderate): Secondary | ICD-10-CM | POA: Diagnosis not present

## 2015-01-05 DIAGNOSIS — Z79899 Other long term (current) drug therapy: Secondary | ICD-10-CM | POA: Diagnosis not present

## 2015-01-05 DIAGNOSIS — Z794 Long term (current) use of insulin: Secondary | ICD-10-CM | POA: Diagnosis not present

## 2015-01-05 DIAGNOSIS — E119 Type 2 diabetes mellitus without complications: Secondary | ICD-10-CM | POA: Insufficient documentation

## 2015-01-05 DIAGNOSIS — Z7982 Long term (current) use of aspirin: Secondary | ICD-10-CM | POA: Insufficient documentation

## 2015-01-05 DIAGNOSIS — I129 Hypertensive chronic kidney disease with stage 1 through stage 4 chronic kidney disease, or unspecified chronic kidney disease: Secondary | ICD-10-CM | POA: Diagnosis not present

## 2015-01-05 DIAGNOSIS — F0781 Postconcussional syndrome: Secondary | ICD-10-CM | POA: Insufficient documentation

## 2015-01-05 DIAGNOSIS — R42 Dizziness and giddiness: Secondary | ICD-10-CM | POA: Diagnosis present

## 2015-01-05 DIAGNOSIS — G44309 Post-traumatic headache, unspecified, not intractable: Secondary | ICD-10-CM | POA: Diagnosis not present

## 2015-01-05 LAB — CBC
HEMATOCRIT: 37.3 % (ref 35.0–47.0)
Hemoglobin: 12.7 g/dL (ref 12.0–16.0)
MCH: 29.4 pg (ref 26.0–34.0)
MCHC: 34 g/dL (ref 32.0–36.0)
MCV: 86.5 fL (ref 80.0–100.0)
PLATELETS: 294 10*3/uL (ref 150–440)
RBC: 4.31 MIL/uL (ref 3.80–5.20)
RDW: 13.8 % (ref 11.5–14.5)
WBC: 7.6 10*3/uL (ref 3.6–11.0)

## 2015-01-05 LAB — BASIC METABOLIC PANEL
Anion gap: 9 (ref 5–15)
BUN: 22 mg/dL — ABNORMAL HIGH (ref 6–20)
CHLORIDE: 105 mmol/L (ref 101–111)
CO2: 27 mmol/L (ref 22–32)
CREATININE: 1.52 mg/dL — AB (ref 0.44–1.00)
Calcium: 9.4 mg/dL (ref 8.9–10.3)
GFR, EST AFRICAN AMERICAN: 39 mL/min — AB (ref >60)
GFR, EST NON AFRICAN AMERICAN: 33 mL/min — AB (ref >60)
Glucose, Bld: 169 mg/dL — ABNORMAL HIGH (ref 65–99)
POTASSIUM: 3.8 mmol/L (ref 3.5–5.1)
SODIUM: 141 mmol/L (ref 135–145)

## 2015-01-05 MED ORDER — MECLIZINE HCL 25 MG PO TABS: 25.0000 mg | ORAL_TABLET | Freq: Three times a day (TID) | ORAL | Status: DC | PRN

## 2015-01-05 MED ORDER — MECLIZINE HCL 25 MG PO TABS
25.0000 mg | ORAL_TABLET | Freq: Once | ORAL | Status: AC
Start: 2015-01-05 — End: 2015-01-05
  Administered 2015-01-05: 25 mg via ORAL
  Filled 2015-01-05: qty 1

## 2015-01-05 NOTE — Discharge Instructions (Signed)
Post-Concussion Syndrome  Post-concussion syndrome describes the symptoms that can occur after a head injury. These symptoms can last from weeks to months.  CAUSES   It is not clear why some head injuries cause post-concussion syndrome. It can occur whether your head injury was mild or severe and whether you were wearing head protection or not.   SIGNS AND SYMPTOMS  · Memory difficulties.  · Dizziness.  · Headaches.  · Double vision or blurry vision.  · Sensitivity to light.  · Hearing difficulties.  · Depression.  · Tiredness.  · Weakness.  · Difficulty with concentration.  · Difficulty sleeping or staying asleep.  · Vomiting.  · Poor balance or instability on your feet.  · Slow reaction time.  · Difficulty learning and remembering things you have heard.  DIAGNOSIS   There is no test to determine whether you have post-concussion syndrome. Your health care provider may order an imaging scan of your brain, such as a CT scan, to check for other problems that may be causing your symptoms (such as a severe injury inside your skull).  TREATMENT   Usually, these problems disappear over time without medical care. Your health care provider may prescribe medicine to help ease your symptoms. It is important to follow up with a neurologist to evaluate your recovery and address any lingering symptoms or issues.  HOME CARE INSTRUCTIONS   · Take medicines only as directed by your health care provider. Do not take aspirin. Aspirin can slow blood clotting.  · Sleep with your head slightly elevated to help with headaches.  · Avoid any situation where there is potential for another head injury. This includes football, hockey, soccer, basketball, martial arts, downhill snow sports, and horseback riding. Your condition will get worse every time you experience a concussion. You should avoid these activities until you are evaluated by the appropriate follow-up health care providers.  · Keep all follow-up visits as directed by your health  care provider. This is important.  SEEK MEDICAL CARE IF:  · You have increased problems paying attention or concentrating.  · You have increased difficulty remembering or learning new information.  · You need more time to complete tasks or assignments than before.  · You have increased irritability or decreased ability to cope with stress.  · You have more symptoms than before.  Seek medical care if you have any of the following symptoms for more than two weeks after your injury:  · Lasting (chronic) headaches.  · Dizziness or balance problems.  · Nausea.  · Vision problems.  · Increased sensitivity to noise or light.  · Depression or mood swings.  · Anxiety or irritability.  · Memory problems.  · Difficulty concentrating or paying attention.  · Sleep problems.  · Feeling tired all the time.  SEEK IMMEDIATE MEDICAL CARE IF:  · You have confusion or unusual drowsiness.  · Others find it difficult to wake you up.  · You have nausea or persistent, forceful vomiting.  · You feel like you are moving when you are not (vertigo). Your eyes may move rapidly back and forth.  · You have convulsions or faint.  · You have severe, persistent headaches that are not relieved by medicine.  · You cannot use your arms or legs normally.  · One of your pupils is larger than the other.  · You have clear or bloody discharge from your nose or ears.  · Your problems are getting worse, not better.  MAKE   SURE YOU:  · Understand these instructions.  · Will watch your condition.  · Will get help right away if you are not doing well or get worse.     This information is not intended to replace advice given to you by your health care provider. Make sure you discuss any questions you have with your health care provider.     Document Released: 08/11/2001 Document Revised: 03/12/2014 Document Reviewed: 05/27/2013  Elsevier Interactive Patient Education ©2016 [employer]
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
## 2015-01-10 DIAGNOSIS — G44219 Episodic tension-type headache, not intractable: Secondary | ICD-10-CM | POA: Insufficient documentation

## 2015-02-01 DIAGNOSIS — R51 Headache: Secondary | ICD-10-CM

## 2015-02-01 DIAGNOSIS — R42 Dizziness and giddiness: Secondary | ICD-10-CM | POA: Insufficient documentation

## 2015-02-01 DIAGNOSIS — R519 Headache, unspecified: Secondary | ICD-10-CM | POA: Insufficient documentation

## 2015-02-01 DIAGNOSIS — E119 Type 2 diabetes mellitus without complications: Secondary | ICD-10-CM | POA: Insufficient documentation

## 2015-02-01 DIAGNOSIS — R293 Abnormal posture: Secondary | ICD-10-CM | POA: Insufficient documentation

## 2015-03-18 ENCOUNTER — Other Ambulatory Visit: Payer: Self-pay | Admitting: Orthopedic Surgery

## 2015-03-18 DIAGNOSIS — M1711 Unilateral primary osteoarthritis, right knee: Secondary | ICD-10-CM

## 2015-03-18 DIAGNOSIS — M25561 Pain in right knee: Secondary | ICD-10-CM

## 2015-04-06 ENCOUNTER — Ambulatory Visit
Admission: RE | Admit: 2015-04-06 | Discharge: 2015-04-06 | Disposition: A | Discharge status: Home / Self Care | Payer: Medicare Other | Source: Ambulatory Visit | Attending: Orthopedic Surgery | Admitting: Orthopedic Surgery

## 2015-04-06 DIAGNOSIS — M1711 Unilateral primary osteoarthritis, right knee: Secondary | ICD-10-CM | POA: Diagnosis present

## 2015-04-06 DIAGNOSIS — M23303 Other meniscus derangements, unspecified medial meniscus, right knee: Secondary | ICD-10-CM | POA: Insufficient documentation

## 2015-04-06 DIAGNOSIS — M25561 Pain in right knee: Secondary | ICD-10-CM | POA: Diagnosis not present

## 2015-04-06 DIAGNOSIS — S83241A Other tear of medial meniscus, current injury, right knee, initial encounter: Secondary | ICD-10-CM | POA: Insufficient documentation

## 2015-04-13 ENCOUNTER — Encounter: Payer: Self-pay | Admitting: Pain Medicine

## 2015-04-13 ENCOUNTER — Other Ambulatory Visit: Payer: Self-pay | Admitting: Pain Medicine

## 2015-04-13 ENCOUNTER — Ambulatory Visit: Payer: Medicare Other | Attending: Pain Medicine | Admitting: Pain Medicine

## 2015-04-13 VITALS — BP 108/74 | HR 71 | Temp 98.2°F | Resp 16 | Ht 60.0 in | Wt 160.0 lb

## 2015-04-13 DIAGNOSIS — E1122 Type 2 diabetes mellitus with diabetic chronic kidney disease: Secondary | ICD-10-CM | POA: Diagnosis not present

## 2015-04-13 DIAGNOSIS — I10 Essential (primary) hypertension: Secondary | ICD-10-CM | POA: Insufficient documentation

## 2015-04-13 DIAGNOSIS — E559 Vitamin D deficiency, unspecified: Secondary | ICD-10-CM | POA: Insufficient documentation

## 2015-04-13 DIAGNOSIS — J45909 Unspecified asthma, uncomplicated: Secondary | ICD-10-CM | POA: Diagnosis not present

## 2015-04-13 DIAGNOSIS — I129 Hypertensive chronic kidney disease with stage 1 through stage 4 chronic kidney disease, or unspecified chronic kidney disease: Secondary | ICD-10-CM | POA: Insufficient documentation

## 2015-04-13 DIAGNOSIS — G8929 Other chronic pain: Secondary | ICD-10-CM | POA: Insufficient documentation

## 2015-04-13 DIAGNOSIS — M797 Fibromyalgia: Secondary | ICD-10-CM | POA: Insufficient documentation

## 2015-04-13 DIAGNOSIS — E7801 Familial hypercholesterolemia: Secondary | ICD-10-CM | POA: Diagnosis not present

## 2015-04-13 DIAGNOSIS — M533 Sacrococcygeal disorders, not elsewhere classified: Secondary | ICD-10-CM | POA: Insufficient documentation

## 2015-04-13 DIAGNOSIS — M545 Low back pain, unspecified: Secondary | ICD-10-CM

## 2015-04-13 DIAGNOSIS — K219 Gastro-esophageal reflux disease without esophagitis: Secondary | ICD-10-CM | POA: Insufficient documentation

## 2015-04-13 DIAGNOSIS — R296 Repeated falls: Secondary | ICD-10-CM

## 2015-04-13 DIAGNOSIS — M25551 Pain in right hip: Secondary | ICD-10-CM | POA: Insufficient documentation

## 2015-04-13 DIAGNOSIS — E785 Hyperlipidemia, unspecified: Secondary | ICD-10-CM | POA: Diagnosis not present

## 2015-04-13 DIAGNOSIS — N183 Chronic kidney disease, stage 3 unspecified: Secondary | ICD-10-CM | POA: Insufficient documentation

## 2015-04-13 DIAGNOSIS — F329 Major depressive disorder, single episode, unspecified: Secondary | ICD-10-CM | POA: Insufficient documentation

## 2015-04-13 DIAGNOSIS — M25569 Pain in unspecified knee: Secondary | ICD-10-CM | POA: Diagnosis present

## 2015-04-13 DIAGNOSIS — M79604 Pain in right leg: Secondary | ICD-10-CM

## 2015-04-13 DIAGNOSIS — Z9181 History of falling: Secondary | ICD-10-CM | POA: Insufficient documentation

## 2015-04-13 DIAGNOSIS — M25561 Pain in right knee: Secondary | ICD-10-CM

## 2015-04-13 DIAGNOSIS — F32A Depression, unspecified: Secondary | ICD-10-CM | POA: Insufficient documentation

## 2015-04-13 DIAGNOSIS — M5410 Radiculopathy, site unspecified: Secondary | ICD-10-CM

## 2015-04-13 LAB — COMPREHENSIVE METABOLIC PANEL
ALK PHOS: 58 U/L (ref 38–126)
ALT: 21 U/L (ref 14–54)
AST: 25 U/L (ref 15–41)
Albumin: 4.2 g/dL (ref 3.5–5.0)
Anion gap: 5 (ref 5–15)
BILIRUBIN TOTAL: 0.5 mg/dL (ref 0.3–1.2)
BUN: 28 mg/dL — AB (ref 6–20)
CALCIUM: 9.7 mg/dL (ref 8.9–10.3)
CO2: 30 mmol/L (ref 22–32)
Chloride: 104 mmol/L (ref 101–111)
Creatinine, Ser: 1.46 mg/dL — ABNORMAL HIGH (ref 0.44–1.00)
GFR calc Af Amer: 41 mL/min — ABNORMAL LOW (ref >60)
GFR, EST NON AFRICAN AMERICAN: 35 mL/min — AB (ref >60)
GLUCOSE: 65 mg/dL (ref 65–99)
POTASSIUM: 4.1 mmol/L (ref 3.5–5.1)
Sodium: 139 mmol/L (ref 135–145)
TOTAL PROTEIN: 8.4 g/dL — AB (ref 6.5–8.1)

## 2015-04-13 LAB — SEDIMENTATION RATE: Sed Rate: 1 mm/hr (ref 0–30)

## 2015-04-13 LAB — MAGNESIUM: Magnesium: 1.9 mg/dL (ref 1.7–2.4)

## 2015-04-13 LAB — C-REACTIVE PROTEIN
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
## 2015-04-13 NOTE — Progress Notes (Signed)
New patient here to discuss treatment of R knee pain.  Patient is currently using a cane to assist with ambulation.  Had an injection into R knee that has helped with the pain.  Patient does not currently taking any pain medicine other than Tylenol.

## 2015-04-14 NOTE — Progress Notes (Signed)
Quick Note:  BUN levels between 7 to 20 mg/dL (2.5 to 7.1 mmol/L) are considered normal. Elevated blood urea nitrogen can also be due to: urinary tract obstruction; congestive heart failure or recent heart attack; gastrointestinal bleeding; dehydration; shock; severe burns; certain medications, such as corticosteroids and some antibiotics; and/or a high protein diet.  Normal Creatinine levels are between 0.5 and 0.9 mg/dl for our lab. Any condition that impairs the function of the kidneys is likely to raise the creatinine level in the blood. The most common causes of longstanding (chronic) kidney disease in adults are high blood pressure and diabetes. Other causes of elevated blood creatinine levels include drugs, ingestion of a large amount of dietary meat, kidney infections, rhabdomyolysis (abnormal muscle breakdown), and urinary tract obstruction.  BUN-to-creatinine ratio >20:1 (BUN dispropertionally higher than the creatinine levels) suggests prerenal azotemia (dehydration or renal hypoperfusion), while <10:1 levels suggest renal damage.  GFR stands for glomerular filtration rate. This is a calculation used to determine how well the kidneys are functioning. Low GFR are associated with chronic kidney disease. Whenever present, this should be evaluated and treated by the patient's primary care physician.  ______

## 2015-04-14 NOTE — Progress Notes (Signed)
Quick Note:  Lab results reviewed and found to be within normal limits. ______ 

## 2015-04-21 LAB — TOXASSURE SELECT 13 (MW), URINE: PDF: 0

## 2015-04-29 DIAGNOSIS — R2681 Unsteadiness on feet: Secondary | ICD-10-CM | POA: Insufficient documentation

## 2015-04-29 DIAGNOSIS — R200 Anesthesia of skin: Secondary | ICD-10-CM | POA: Insufficient documentation

## 2015-05-01 ENCOUNTER — Encounter: Payer: Self-pay | Admitting: Pain Medicine

## 2015-05-01 NOTE — Progress Notes (Signed)
Quick Note:  NOTE: This forensic urine drug screen (UDS) test was conducted using a state-of-the-art ultra high performance liquid chromatography and mass spectrometry system (UPLC/MS-MS), the most sophisticated and accurate method available. UPLC/MS-MS is 1,000 times more precise and accurate than standard gas chromatography and mass spectrometry (GC/MS). This system can analyze 26 drug categories and 180 drug compounds.  This test detected the presence of Benzoylecgonine, a metabolite of cocaine; its presence indicates use of this illegal substance.  The findings of this UDT are unacceptable and incompatible with appropriate, responsible, and safe use of controlled substances.  Because of these results, we no longer will offer pharmacological therapies involving the use of controlled substances.   This represents non-compliance with the safe use of controlled substances. We will no longer offer this therapy as a treatment option for this patient. ______

## 2015-05-03 ENCOUNTER — Ambulatory Visit
Admission: RE | Admit: 2015-05-03 | Discharge: 2015-05-03 | Disposition: A | Discharge status: Home / Self Care | Payer: Medicare Other | Source: Ambulatory Visit | Attending: Pain Medicine | Admitting: Pain Medicine

## 2015-05-03 DIAGNOSIS — M47816 Spondylosis without myelopathy or radiculopathy, lumbar region: Secondary | ICD-10-CM | POA: Insufficient documentation

## 2015-05-03 DIAGNOSIS — F1490 Cocaine use, unspecified, uncomplicated: Secondary | ICD-10-CM | POA: Insufficient documentation

## 2015-05-03 DIAGNOSIS — R937 Abnormal findings on diagnostic imaging of other parts of musculoskeletal system: Secondary | ICD-10-CM | POA: Insufficient documentation

## 2015-05-03 DIAGNOSIS — N289 Disorder of kidney and ureter, unspecified: Secondary | ICD-10-CM | POA: Insufficient documentation

## 2015-05-03 DIAGNOSIS — M5416 Radiculopathy, lumbar region: Secondary | ICD-10-CM | POA: Diagnosis not present

## 2015-05-03 DIAGNOSIS — M5410 Radiculopathy, site unspecified: Secondary | ICD-10-CM

## 2015-05-03 DIAGNOSIS — M5136 Other intervertebral disc degeneration, lumbar region: Secondary | ICD-10-CM | POA: Insufficient documentation

## 2015-05-03 DIAGNOSIS — G8929 Other chronic pain: Secondary | ICD-10-CM | POA: Diagnosis present

## 2015-07-13 ENCOUNTER — Other Ambulatory Visit: Payer: Self-pay | Admitting: Preventative Medicine

## 2015-07-13 DIAGNOSIS — Z1231 Encounter for screening mammogram for malignant neoplasm of breast: Secondary | ICD-10-CM

## 2015-09-28 DIAGNOSIS — R1310 Dysphagia, unspecified: Secondary | ICD-10-CM | POA: Insufficient documentation

## 2015-09-29 ENCOUNTER — Other Ambulatory Visit: Payer: Self-pay | Admitting: Neurology

## 2015-09-29 DIAGNOSIS — R1310 Dysphagia, unspecified: Secondary | ICD-10-CM

## 2016-01-17 ENCOUNTER — Other Ambulatory Visit: Payer: Self-pay | Admitting: Family Medicine

## 2016-01-17 DIAGNOSIS — Z13820 Encounter for screening for osteoporosis: Secondary | ICD-10-CM

## 2016-02-09 ENCOUNTER — Ambulatory Visit
Admission: RE | Admit: 2016-02-09 | Discharge: 2016-02-09 | Disposition: A | Discharge status: Home / Self Care | Payer: Commercial Managed Care - HMO | Source: Ambulatory Visit | Attending: Preventative Medicine | Admitting: Preventative Medicine

## 2016-02-09 ENCOUNTER — Ambulatory Visit
Admission: RE | Admit: 2016-02-09 | Discharge: 2016-02-09 | Disposition: A | Discharge status: Home / Self Care | Payer: Commercial Managed Care - HMO | Source: Ambulatory Visit | Attending: Family Medicine | Admitting: Family Medicine

## 2016-02-09 DIAGNOSIS — Z13820 Encounter for screening for osteoporosis: Secondary | ICD-10-CM | POA: Insufficient documentation

## 2016-02-09 DIAGNOSIS — Z1231 Encounter for screening mammogram for malignant neoplasm of breast: Secondary | ICD-10-CM | POA: Insufficient documentation

## 2016-02-09 DIAGNOSIS — Z780 Asymptomatic menopausal state: Secondary | ICD-10-CM | POA: Insufficient documentation

## 2016-04-27 ENCOUNTER — Other Ambulatory Visit: Payer: Self-pay | Admitting: Gastroenterology

## 2016-04-27 DIAGNOSIS — R1310 Dysphagia, unspecified: Secondary | ICD-10-CM

## 2016-04-27 DIAGNOSIS — K219 Gastro-esophageal reflux disease without esophagitis: Secondary | ICD-10-CM

## 2016-05-04 ENCOUNTER — Ambulatory Visit
Admission: RE | Admit: 2016-05-04 | Discharge: 2016-05-04 | Disposition: A | Discharge status: Home / Self Care | Payer: Medicare Other | Source: Ambulatory Visit | Attending: Gastroenterology | Admitting: Gastroenterology

## 2016-05-04 DIAGNOSIS — K219 Gastro-esophageal reflux disease without esophagitis: Secondary | ICD-10-CM | POA: Diagnosis present

## 2016-05-04 DIAGNOSIS — R1310 Dysphagia, unspecified: Secondary | ICD-10-CM

## 2016-05-21 ENCOUNTER — Encounter: Payer: Self-pay | Admitting: *Deleted

## 2016-05-22 ENCOUNTER — Ambulatory Visit: Payer: Medicare Other | Admitting: Anesthesiology

## 2016-05-22 ENCOUNTER — Ambulatory Visit
Admission: RE | Admit: 2016-05-22 | Discharge: 2016-05-22 | Disposition: A | Discharge status: Home / Self Care | Payer: Medicare Other | Source: Ambulatory Visit | Attending: Gastroenterology | Admitting: Gastroenterology

## 2016-05-22 ENCOUNTER — Encounter
Admission: RE | Disposition: A | Discharge status: Home / Self Care | Payer: Self-pay | Source: Ambulatory Visit | Attending: Gastroenterology

## 2016-05-22 DIAGNOSIS — Z79899 Other long term (current) drug therapy: Secondary | ICD-10-CM | POA: Diagnosis not present

## 2016-05-22 DIAGNOSIS — F329 Major depressive disorder, single episode, unspecified: Secondary | ICD-10-CM | POA: Diagnosis not present

## 2016-05-22 DIAGNOSIS — K449 Diaphragmatic hernia without obstruction or gangrene: Secondary | ICD-10-CM | POA: Diagnosis not present

## 2016-05-22 DIAGNOSIS — R1310 Dysphagia, unspecified: Secondary | ICD-10-CM | POA: Diagnosis present

## 2016-05-22 DIAGNOSIS — I129 Hypertensive chronic kidney disease with stage 1 through stage 4 chronic kidney disease, or unspecified chronic kidney disease: Secondary | ICD-10-CM | POA: Diagnosis not present

## 2016-05-22 DIAGNOSIS — N183 Chronic kidney disease, stage 3 (moderate): Secondary | ICD-10-CM | POA: Insufficient documentation

## 2016-05-22 DIAGNOSIS — Z794 Long term (current) use of insulin: Secondary | ICD-10-CM | POA: Insufficient documentation

## 2016-05-22 DIAGNOSIS — E559 Vitamin D deficiency, unspecified: Secondary | ICD-10-CM | POA: Diagnosis not present

## 2016-05-22 DIAGNOSIS — E1122 Type 2 diabetes mellitus with diabetic chronic kidney disease: Secondary | ICD-10-CM | POA: Diagnosis not present

## 2016-05-22 DIAGNOSIS — R197 Diarrhea, unspecified: Secondary | ICD-10-CM | POA: Diagnosis not present

## 2016-05-22 DIAGNOSIS — E785 Hyperlipidemia, unspecified: Secondary | ICD-10-CM | POA: Insufficient documentation

## 2016-05-22 DIAGNOSIS — Z7982 Long term (current) use of aspirin: Secondary | ICD-10-CM | POA: Diagnosis not present

## 2016-05-22 DIAGNOSIS — Z538 Procedure and treatment not carried out for other reasons: Secondary | ICD-10-CM | POA: Diagnosis not present

## 2016-05-22 DIAGNOSIS — K921 Melena: Secondary | ICD-10-CM | POA: Insufficient documentation

## 2016-05-22 DIAGNOSIS — R1030 Lower abdominal pain, unspecified: Secondary | ICD-10-CM | POA: Diagnosis not present

## 2016-05-22 DIAGNOSIS — K219 Gastro-esophageal reflux disease without esophagitis: Secondary | ICD-10-CM | POA: Insufficient documentation

## 2016-05-22 DIAGNOSIS — J45909 Unspecified asthma, uncomplicated: Secondary | ICD-10-CM | POA: Diagnosis not present

## 2016-05-22 DIAGNOSIS — K222 Esophageal obstruction: Secondary | ICD-10-CM | POA: Diagnosis not present

## 2016-05-22 HISTORY — PX: ESOPHAGOGASTRODUODENOSCOPY (EGD) WITH PROPOFOL: SHX5813

## 2016-05-22 HISTORY — PX: COLONOSCOPY WITH PROPOFOL: SHX5780

## 2016-05-22 LAB — GLUCOSE, CAPILLARY: Glucose-Capillary: 109 mg/dL — ABNORMAL HIGH (ref 65–99)

## 2016-05-22 SURGERY — ESOPHAGOGASTRODUODENOSCOPY (EGD) WITH PROPOFOL
Anesthesia: General

## 2016-05-22 MED ORDER — PROPOFOL 500 MG/50ML IV EMUL
INTRAVENOUS | Status: AC
  Filled 2016-05-22: qty 50

## 2016-05-22 MED ORDER — PROPOFOL 500 MG/50ML IV EMUL
INTRAVENOUS | Status: DC | PRN
  Administered 2016-05-22: 150 ug/kg/min via INTRAVENOUS

## 2016-05-22 MED ORDER — SODIUM CHLORIDE 0.9 % IV SOLN
INTRAVENOUS | Status: DC
  Administered 2016-05-22: 08:00:00 via INTRAVENOUS
  Administered 2016-05-22: 1000 mL via INTRAVENOUS

## 2016-05-22 MED ORDER — SODIUM CHLORIDE 0.9 % IV SOLN: INTRAVENOUS | Status: DC

## 2016-05-22 MED ORDER — PHENYLEPHRINE HCL 10 MG/ML IJ SOLN
INTRAMUSCULAR | Status: AC
  Filled 2016-05-22: qty 1

## 2016-05-22 MED ORDER — PROPOFOL 10 MG/ML IV BOLUS
INTRAVENOUS | Status: DC | PRN
  Administered 2016-05-22: 20 mg via INTRAVENOUS
  Administered 2016-05-22: 70 mg via INTRAVENOUS

## 2016-05-22 MED ORDER — LIDOCAINE HCL (PF) 2 % IJ SOLN
INTRAMUSCULAR | Status: AC
  Filled 2016-05-22: qty 2

## 2016-05-22 NOTE — Anesthesia Preprocedure Evaluation (Signed)
Anesthesia Evaluation  Patient identified by MRN, date of birth, ID band Patient awake    Reviewed: Allergy & Precautions, NPO status , Patient's Chart, lab work & pertinent test results  History of Anesthesia Complications Negative for: history of anesthetic complications  Airway Mallampati: III       Dental  (+) Upper Dentures, Lower Dentures   Pulmonary asthma ,           Cardiovascular hypertension, Pt. on medications      Neuro/Psych Depression negative neurological ROS     GI/Hepatic Neg liver ROS, GERD  Medicated,  Endo/Other  diabetes, Type 2, Oral Hypoglycemic Agents, Insulin Dependent  Renal/GU Renal InsufficiencyRenal disease     Musculoskeletal  (+) Fibromyalgia -  Abdominal   Peds  Hematology  (+) anemia ,   Anesthesia Other Findings   Reproductive/Obstetrics                            Anesthesia Physical Anesthesia Plan  ASA: III  Anesthesia Plan: General   Post-op Pain Management:    Induction: Intravenous  Airway Management Planned: Nasal Cannula  Additional Equipment:   Intra-op Plan:   Post-operative Plan:   Informed Consent: I have reviewed the patients History and Physical, chart, labs and discussed the procedure including the risks, benefits and alternatives for the proposed anesthesia with the patient or authorized representative who has indicated his/her understanding and acceptance.     Plan Discussed with:   Anesthesia Plan Comments:         Anesthesia Quick Evaluation

## 2016-05-22 NOTE — Anesthesia Post-op Follow-up Note (Cosign Needed)
Anesthesia QCDR form completed.        

## 2016-05-22 NOTE — H&P (Signed)
Outpatient short stay form Pre-procedure 05/22/2016 7:32 AM Theresa Booker  Primary Physician: Theresa Booker  Reason for visit:  EGD and colonoscopy  History of present illness:  Patient is a 71 year old female presenting today as above. She has been having difficulty with swallowing on occasion as well as heartburn. She did develop some black stools several months ago however she also takes iron. There is also been issues of diarrhea and reflux. She has been eating 20 mg omeprazole daily. She had a barium swallow that indicated either a narrowing at the bottom of the esophagus or perhaps muscle spasm. 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 He was actually Hemoccult negative on testing. Patient does take a daily 81 mg aspirin however denies use of other aspirin products or blood thinning agents.   Current Facility-Administered Medications:  .  0.9 %  sodium chloride infusion, , Intravenous, Continuous, Theresa Sails, Booker, Last Rate: 20 mL/hr at 05/22/16 0714, 1,000 mL at 05/22/16 0714 .  0.9 %  sodium chloride infusion, , Intravenous, Continuous, Theresa Sails, Booker  Prescriptions Prior to Admission  Medication Sig Dispense Refill Last Dose  . acetaminophen (TYLENOL) 500 MG tablet Take 500 mg by mouth every 6 (six) hours as needed.   05/21/2016 at Unknown time  . albuterol (PROAIR HFA) 108 (90 BASE) MCG/ACT inhaler Inhale into the lungs every 6 (six) hours as needed for wheezing or shortness of breath.   05/21/2016 at Unknown time  . ASPIRIN ADULT LOW STRENGTH PO Take by mouth 1 day or 1 dose.    Past Week at Unknown time  . ATORVASTATIN CALCIUM PO Take 80 mg by mouth 1 day or 1 dose.    Past Week at Unknown time  . Cholecalciferol (VITAMIN D) 2000 UNITS tablet Take 2,000 Units by mouth daily.   Past Week at Unknown time  . Choline Fenofibrate 45 MG capsule Take 45 mg by mouth daily.   Past Week at Unknown time  . donepezil (ARICEPT) 5 MG tablet Take 5 mg by mouth at bedtime.    05/21/2016 at Unknown time  . FERROUS SULFATE PO Take by mouth 2 (two) times daily.    05/21/2016 at Unknown time  . HYDROCHLOROTHIAZIDE PO Take 25 mg by mouth 1 day or 1 dose.    05/21/2016 at Unknown time  . insulin glargine (LANTUS) 100 UNIT/ML injection Inject 43 Units into the skin at bedtime.    05/21/2016 at Unknown time  . Insulin Pen Needle (RELION PEN NEEDLE 31G/8MM) 31G X 8 MM MISC by Does not apply route.   05/21/2016 at Unknown time  . ISOSORBIDE MONONITRATE PO Take 30 mg by mouth 1 day or 1 dose.    05/21/2016 at Unknown time  . loratadine (CLARITIN) 10 MG tablet Take 10 mg by mouth daily.   05/21/2016 at Unknown time  . losartan (COZAAR) 25 MG tablet Take 25 mg by mouth daily.   05/21/2016 at Unknown time  . meclizine (ANTIVERT) 25 MG tablet Take 1 tablet (25 mg total) by mouth 3 (three) times daily as needed for dizziness. 30 tablet 0 05/21/2016 at Unknown time  . Multiple Vitamins-Minerals (CENTRUM SILVER ADULT 50+ PO) Take by mouth.   Past Week at Unknown time  . nortriptyline (PAMELOR) 10 MG capsule Take 10 mg by mouth at bedtime.   05/21/2016 at Unknown time  . omeprazole (PRILOSEC) 20 MG capsule Take 20 mg by mouth daily.   05/21/2016 at Unknown time  .  sitaGLIPtin (JANUVIA) 100 MG tablet Take 100 mg by mouth daily.   05/21/2016 at Unknown time  . SUMAtriptan (IMITREX) 100 MG tablet Take 100 mg by mouth every 2 (two) hours as needed for migraine. May repeat in 2 hours if headache persists or recurs.   Past Month at Unknown time  . Venlafaxine HCl (EFFEXOR XR PO) Take 37.5 mg by mouth 1 day or 1 dose.    Past Week at Unknown time     Allergies  Allergen Reactions  . Ace Inhibitors   . Pollen Extracts [Pollen Extract]   . Vicodin [Hydrocodone-Acetaminophen]      Past Medical History:  Diagnosis Date  . Asthma   . Atypical chest pain    neg ETT at [facility]  . CKD (chronic kidney disease), stage III   . Depression   . Diabetes mellitus without complication ([place])   . Fibromyalgia    . HLD (hyperlipidemia)   . Hypertension   . Vitamin D deficiency     Review of systems:      Physical Exam    Heart and lungs: Regular rate and rhythm without rub or gallop, lungs are bilaterally clear.    HEENT: Normocephalic atraumatic eyes are anicteric    Other:     Pertinant exam for procedure: Soft nontender nondistended bowel sounds positive normoactive.    Planned proceedures: EGD, colonoscopy and indicated procedures. I have discussed the risks benefits and complications of procedures to include not limited to bleeding, infection, perforation and the risk of sedation and the patient wishes to proceed.    Theresa Sails, [facility] 05/22/2016  7:32 AM

## 2016-05-22 NOTE — Op Note (Signed)
[facility] Patient Name: Theresa Booker Procedure Date: 05/22/2016 7:15 AM MRN: [id number] Account #: [id number] Date of Birth: Sep 29, 1945 Admit Type: Outpatient Age: 71 Room: [facility] ENDO ROOM 3 Gender: Female Note Status: Finalized Procedure:            Colonoscopy Indications:          Lower abdominal pain, Clinically significant diarrhea                        of unexplained origin Providers:            Lollie Sails, MD Complications:        No immediate complications. Procedure:            Pre-Anesthesia Assessment:                       - ASA Grade Assessment: III - A patient with severe                        systemic disease.                       After obtaining informed consent, the colonoscope was                        passed under direct vision. Throughout the procedure,                        the patient's blood pressure, pulse, and oxygen                        saturations were monitored continuously. The procedure                        was aborted. The colonscope was not inserted.                        Medications were given. Findings:      Patient with semi-formed thick effluent coming out of the rectum and       noted on DRE. Procedure cancelled.      aborted Impression:           - No specimens collected. Recommendation:       - Discharge patient to home.                       - Patient will need reprep and rescheduled. Diagnosis Code(s):    --- Professional ---                       R10.30, Lower abdominal pain, unspecified                       R19.7, Diarrhea, unspecified Lollie Sails, MD 05/22/2016 8:10:30 AM This report has been signed electronically. Number of Addenda: 0 Note Initiated On: 05/22/2016 7:15 AM      [facility]

## 2016-05-22 NOTE — Op Note (Signed)
[facility] Gastroenterology Patient Name: Theresa Booker Procedure Date: 05/22/2016 7:16 AM MRN: [id number] Account #: [id number] Date of Birth: 11-Nov-1945 Admit Type: Outpatient Age: 71 Room: [facility] ENDO ROOM 3 Gender: Female Note Status: Finalized Procedure:            Upper GI endoscopy Indications:          Dysphagia, Melena Providers:            Lollie Sails, MD Referring MD:         [name] (Referring MD) Medicines:            Monitored Anesthesia Care Complications:        No immediate complications. Procedure:            Pre-Anesthesia Assessment:                       - ASA Grade Assessment: III - A patient with severe                        systemic disease.                       After obtaining informed consent, the endoscope was                        passed under direct vision. Throughout the procedure,                        the patient's blood pressure, pulse, and oxygen                        saturations were monitored continuously. The Endoscope                        was introduced through the mouth, and advanced to the                        antrum of the stomach. The upper GI endoscopy was                        extremely difficult due to presence of food. The                        patient tolerated the procedure well. Findings:      A widely patent Schatzki ring (acquired) was found at the       gastroesophageal junction.      A small hiatal hernia was present.      A large amount of food (residue) was found in the entire examined       stomach. An area was rinses with a small amount of water to reveal       apparent normal gastric lining underneath, however the vault was       obscured with food material.      Intervention was not done due to concern for aspiration risk. Impression:           - Widely patent Schatzki ring.                       - Small hiatal hernia.                       -  A large amount of food (residue) in the  stomach.                       - No specimens collected. Recommendation:       - Reprep and reschedule. Procedure Code(s):    --- Professional ---                       [phone], 90, Esophagogastroduodenoscopy, flexible,                        transoral; diagnostic, including collection of                        specimen(s) by brushing or washing, when performed                        (separate procedure) Diagnosis Code(s):    --- Professional ---                       K22.2, Esophageal obstruction                       K44.9, Diaphragmatic hernia without obstruction or                        gangrene                       R13.10, Dysphagia, unspecified                       K92.1, Melena (includes Hematochezia) CPT copyright 2016 American Medical Association. All rights reserved. The codes documented in this report are preliminary and upon coder review may  be revised to meet current compliance requirements. Lollie Sails, MD 05/22/2016 7:57:35 AM This report has been signed electronically. Number of Addenda: 0 Note Initiated On: 05/22/2016 7:16 AM      [facility]

## 2016-05-22 NOTE — Transfer of Care (Signed)
Immediate Anesthesia Transfer of Care Note  Patient: Theresa Booker  Procedure(s) Performed: Procedure(s): ESOPHAGOGASTRODUODENOSCOPY (EGD) WITH PROPOFOL (N/A) COLONOSCOPY WITH PROPOFOL (N/A)  Patient Location: PACU  Anesthesia Type:General  Level of Consciousness: sedated  Airway & Oxygen Therapy: Patient Spontanous Breathing and Patient connected to nasal cannula oxygen  Post-op Assessment: Report given to RN and Post -op Vital signs reviewed and stable  Post vital signs: Reviewed and stable  Last Vitals:  Vitals:   05/22/16 0701 05/22/16 0806  BP: 127/82 90/68  Pulse: 87 72  Resp: 16 15  Temp: 36.3 C     Last Pain:  Vitals:   05/22/16 0701  TempSrc: Tympanic         Complications: No apparent anesthesia complications

## 2016-05-22 NOTE — Anesthesia Procedure Notes (Signed)
Date/Time: 05/22/2016 7:58 AM Performed by: Nelda Marseille Pre-anesthesia Checklist: Patient identified, Emergency Drugs available, Suction available, Patient being monitored and Timeout performed Oxygen Delivery Method: Simple face mask

## 2016-05-22 NOTE — Anesthesia Postprocedure Evaluation (Signed)
Anesthesia Post Note  Patient: Theresa Booker  Procedure(s) Performed: Procedure(s) (LRB): ESOPHAGOGASTRODUODENOSCOPY (EGD) WITH PROPOFOL (N/A) COLONOSCOPY WITH PROPOFOL (N/A)  Patient location during evaluation: Endoscopy Anesthesia Type: General Level of consciousness: awake and alert Pain management: pain level controlled Vital Signs Assessment: post-procedure vital signs reviewed and stable Respiratory status: spontaneous breathing and respiratory function stable Cardiovascular status: stable Anesthetic complications: no     Last Vitals:  Vitals:   05/22/16 0806 05/22/16 0812  BP: 90/68   Pulse: 72 71  Resp: 15 (!) 28  Temp: (!) (P) 35.4 C     Last Pain:  Vitals:   05/22/16 0806  TempSrc: (P) Tympanic                 Delfina Schreurs K

## 2016-05-23 ENCOUNTER — Encounter: Payer: Self-pay | Admitting: Gastroenterology

## 2016-07-29 ENCOUNTER — Encounter: Payer: Self-pay | Admitting: Emergency Medicine

## 2016-07-29 ENCOUNTER — Emergency Department: Payer: Medicare Other

## 2016-07-29 ENCOUNTER — Emergency Department
Admission: EM | Admit: 2016-07-29 | Discharge: 2016-07-29 | Disposition: A | Discharge status: Home / Self Care | Payer: Medicare Other | Attending: Emergency Medicine | Admitting: Emergency Medicine

## 2016-07-29 DIAGNOSIS — Z7984 Long term (current) use of oral hypoglycemic drugs: Secondary | ICD-10-CM | POA: Insufficient documentation

## 2016-07-29 DIAGNOSIS — Y998 Other external cause status: Secondary | ICD-10-CM | POA: Insufficient documentation

## 2016-07-29 DIAGNOSIS — W06XXXA Fall from bed, initial encounter: Secondary | ICD-10-CM | POA: Insufficient documentation

## 2016-07-29 DIAGNOSIS — J45909 Unspecified asthma, uncomplicated: Secondary | ICD-10-CM | POA: Insufficient documentation

## 2016-07-29 DIAGNOSIS — Z794 Long term (current) use of insulin: Secondary | ICD-10-CM | POA: Insufficient documentation

## 2016-07-29 DIAGNOSIS — Y92003 Bedroom of unspecified non-institutional (private) residence as the place of occurrence of the external cause: Secondary | ICD-10-CM | POA: Diagnosis not present

## 2016-07-29 DIAGNOSIS — S40011A Contusion of right shoulder, initial encounter: Secondary | ICD-10-CM | POA: Diagnosis not present

## 2016-07-29 DIAGNOSIS — I129 Hypertensive chronic kidney disease with stage 1 through stage 4 chronic kidney disease, or unspecified chronic kidney disease: Secondary | ICD-10-CM | POA: Insufficient documentation

## 2016-07-29 DIAGNOSIS — Y9389 Activity, other specified: Secondary | ICD-10-CM | POA: Diagnosis not present

## 2016-07-29 DIAGNOSIS — N183 Chronic kidney disease, stage 3 (moderate): Secondary | ICD-10-CM | POA: Diagnosis not present

## 2016-07-29 DIAGNOSIS — G8929 Other chronic pain: Secondary | ICD-10-CM

## 2016-07-29 DIAGNOSIS — M545 Low back pain: Secondary | ICD-10-CM | POA: Insufficient documentation

## 2016-07-29 DIAGNOSIS — S4991XA Unspecified injury of right shoulder and upper arm, initial encounter: Secondary | ICD-10-CM | POA: Diagnosis present

## 2016-07-29 DIAGNOSIS — Z7982 Long term (current) use of aspirin: Secondary | ICD-10-CM | POA: Insufficient documentation

## 2016-07-29 DIAGNOSIS — E1169 Type 2 diabetes mellitus with other specified complication: Secondary | ICD-10-CM | POA: Diagnosis not present

## 2016-07-29 MED ORDER — OXYCODONE HCL 5 MG PO TABS: 5.0000 mg | ORAL_TABLET | Freq: Four times a day (QID) | ORAL | 0 refills | Status: DC | PRN

## 2016-07-29 MED ORDER — IBUPROFEN 400 MG PO TABS
400.0000 mg | ORAL_TABLET | Freq: Once | ORAL | Status: AC
  Administered 2016-07-29: 400 mg via ORAL
  Filled 2016-07-29: qty 1

## 2016-07-29 MED ORDER — ACETAMINOPHEN 500 MG PO TABS
1000.0000 mg | ORAL_TABLET | ORAL | Status: AC
  Administered 2016-07-29: 1000 mg via ORAL
  Filled 2016-07-29: qty 2

## 2016-07-29 NOTE — ED Notes (Signed)
Patient reports mechanical fall. Now complaining of back and right shoulder pain.

## 2016-07-29 NOTE — Discharge Instructions (Signed)

## 2016-07-29 NOTE — ED Triage Notes (Signed)
C/O back pain.  Patient states "back gave out on her" while trying to get out of bed.  Patient fell out of bed and inured right shoulder.  Patient has problems with right shoulder, rotator cuff.
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
## 2016-08-28 ENCOUNTER — Emergency Department: Payer: Medicare Other

## 2016-08-28 ENCOUNTER — Emergency Department
Admission: EM | Admit: 2016-08-28 | Discharge: 2016-08-28 | Disposition: A | Discharge status: Home / Self Care | Payer: Medicare Other | Attending: Student in an Organized Health Care Education/Training Program | Admitting: Student in an Organized Health Care Education/Training Program

## 2016-08-28 ENCOUNTER — Encounter: Payer: Self-pay | Admitting: Emergency Medicine

## 2016-08-28 DIAGNOSIS — J45909 Unspecified asthma, uncomplicated: Secondary | ICD-10-CM | POA: Insufficient documentation

## 2016-08-28 DIAGNOSIS — N183 Chronic kidney disease, stage 3 (moderate): Secondary | ICD-10-CM | POA: Diagnosis not present

## 2016-08-28 DIAGNOSIS — Z79899 Other long term (current) drug therapy: Secondary | ICD-10-CM | POA: Diagnosis not present

## 2016-08-28 DIAGNOSIS — M79606 Pain in leg, unspecified: Secondary | ICD-10-CM | POA: Diagnosis present

## 2016-08-28 DIAGNOSIS — R252 Cramp and spasm: Secondary | ICD-10-CM | POA: Insufficient documentation

## 2016-08-28 DIAGNOSIS — Z7982 Long term (current) use of aspirin: Secondary | ICD-10-CM | POA: Diagnosis not present

## 2016-08-28 DIAGNOSIS — I129 Hypertensive chronic kidney disease with stage 1 through stage 4 chronic kidney disease, or unspecified chronic kidney disease: Secondary | ICD-10-CM | POA: Insufficient documentation

## 2016-08-28 DIAGNOSIS — E119 Type 2 diabetes mellitus without complications: Secondary | ICD-10-CM | POA: Insufficient documentation

## 2016-08-28 LAB — CBC WITH DIFFERENTIAL/PLATELET
Basophils Absolute: 0.1 10*3/uL (ref 0–0.1)
Basophils Relative: 1 %
Eosinophils Absolute: 0.2 10*3/uL (ref 0–0.7)
Eosinophils Relative: 2 %
HEMATOCRIT: 37 % (ref 35.0–47.0)
HEMOGLOBIN: 12.4 g/dL (ref 12.0–16.0)
LYMPHS PCT: 41 %
Lymphs Abs: 4.7 10*3/uL — ABNORMAL HIGH (ref 1.0–3.6)
MCH: 28.8 pg (ref 26.0–34.0)
MCHC: 33.5 g/dL (ref 32.0–36.0)
MCV: 85.9 fL (ref 80.0–100.0)
MONO ABS: 1 10*3/uL — AB (ref 0.2–0.9)
MONOS PCT: 9 %
NEUTROS ABS: 5.4 10*3/uL (ref 1.4–6.5)
NEUTROS PCT: 47 %
Platelets: 327 10*3/uL (ref 150–440)
RBC: 4.31 MIL/uL (ref 3.80–5.20)
RDW: 14.1 % (ref 11.5–14.5)
WBC: 11.3 10*3/uL — ABNORMAL HIGH (ref 3.6–11.0)

## 2016-08-28 LAB — BASIC METABOLIC PANEL
Anion gap: 8 (ref 5–15)
Anion gap: 5 (ref 5–15)
BUN: 34 mg/dL — ABNORMAL HIGH (ref 6–20)
BUN: 32 mg/dL — AB (ref 6–20)
CALCIUM: 9.9 mg/dL (ref 8.9–10.3)
CHLORIDE: 105 mmol/L (ref 101–111)
CHLORIDE: 109 mmol/L (ref 101–111)
CO2: 27 mmol/L (ref 22–32)
CO2: 25 mmol/L (ref 22–32)
CREATININE: 1.99 mg/dL — AB (ref 0.44–1.00)
CREATININE: 1.71 mg/dL — AB (ref 0.44–1.00)
Calcium: 9.1 mg/dL (ref 8.9–10.3)
GFR calc non Af Amer: 24 mL/min — ABNORMAL LOW (ref >60)
GFR calc non Af Amer: 29 mL/min — ABNORMAL LOW (ref >60)
GFR, EST AFRICAN AMERICAN: 28 mL/min — AB (ref >60)
GFR, EST AFRICAN AMERICAN: 33 mL/min — AB (ref >60)
GLUCOSE: 139 mg/dL — AB (ref 65–99)
Glucose, Bld: 165 mg/dL — ABNORMAL HIGH (ref 65–99)
POTASSIUM: 3.7 mmol/L (ref 3.5–5.1)
Potassium: 3.6 mmol/L (ref 3.5–5.1)
SODIUM: 139 mmol/L (ref 135–145)
Sodium: 140 mmol/L (ref 135–145)

## 2016-08-28 LAB — CK
CK TOTAL: 323 U/L — AB (ref 38–234)
Total CK: 391 U/L — ABNORMAL HIGH (ref 38–234)

## 2016-08-28 MED ORDER — SODIUM CHLORIDE 0.9 % IV BOLUS (SEPSIS)
500.0000 mL | Freq: Once | INTRAVENOUS | Status: AC
  Administered 2016-08-28: 500 mL via INTRAVENOUS

## 2016-08-28 MED ORDER — ACETAMINOPHEN 500 MG PO TABS
1000.0000 mg | ORAL_TABLET | Freq: Once | ORAL | Status: AC
  Administered 2016-08-28: 1000 mg via ORAL
  Filled 2016-08-28: qty 2

## 2016-08-28 NOTE — ED Provider Notes (Signed)
Patient received in sign-out from Dr. Alfred Levins.  Workup and evaluation pending repeat labs and [place].  Ultrasound shows no evidence of DVT. On reassessment the patient states her symptoms have resolved after IV fluids. Her repeat CK and BMP are improving. Patient is tolerating oral hydration as she is asymptomatic at this time I do feel the patient is appropriate for discharge home to follow up with PCP regarding possible component of statin-induced myalgias.  Have discussed with the patient and available family all diagnostics and treatments performed thus far and all questions were answered to the best of my ability. The patient demonstrates understanding and agreement with plan.        Merlyn Lot, MD 08/28/16 [phone]

## 2016-08-28 NOTE — ED Notes (Signed)
Pt returned from US

## 2016-08-28 NOTE — ED Notes (Signed)
NAD noted at time of D/C. Pt c/o getting dizzy and nauseated while walking to wheelchair. This RN offered for MD to come see her pt, pt refused. PT given ginger ale and crackers and peanut butter. Pt visualized in NAD. Pt taken to lobby via wheelchair by her grandson.

## 2016-08-28 NOTE — ED Provider Notes (Signed)
[facility] Emergency Department Provider Note  ____________________________________________  Time seen: Approximately 5:40 AM  I have reviewed the triage vital signs and the nursing notes.   HISTORY  Chief Complaint Leg Pain (cramps )   HPI Theresa Booker is a 71 y.o. female with a history of chronic pain, fibromyalgia, chronic kidney disease, hypertensionwho presents for evaluation of bilateral lower extremity cramps. Patient reports that she woke up in the middle of the night and having cramps in her bilateral legs and feet. She denies history of cramps. No new medications. She does take a statin. She said the cramps have been intermittent and severe pain when they happen lasting several seconds at a time. She has been eating and drinking normal. No nausea, vomiting, fever, diarrhea, dysuria, abdominal pain, chest pain or shortness of breath. Patient hasn't tried anything at home for the pain.  Past Medical History:  Diagnosis Date  . Asthma   . Atypical chest pain    neg ETT at [facility]  . CKD (chronic kidney disease), stage III   . Depression   . Diabetes mellitus without complication ([place])   . Fibromyalgia   . HLD (hyperlipidemia)   . Hypertension   . Vitamin D deficiency     Patient Active Problem List   Diagnosis Date Noted  . Cocaine use 05/03/2015  . Abnormal MRI, lumbar spine 05/03/2015  . Numbness in feet 04/29/2015  . Gait instability 04/29/2015  . BP (high blood pressure) 04/13/2015  . Fibromyalgia 04/13/2015  . Essential (primary) hypertension 04/13/2015  . Clinical depression 04/13/2015  . Chronic kidney disease (CKD), stage III (moderate) 04/13/2015  . Airway hyperreactivity 04/13/2015  . Chronic knee pain (Right) 04/13/2015  . Chronic low back pain (Location of Secondary source of pain) (Bilateral) (R>L) 04/13/2015  . Chronic lower straight pain (Location of Primary Source of Pain) (Right) 04/13/2015  . Chronic lumbar radicular  pain (Right) (L5 Dermatome) 04/13/2015  . At high risk for falls 04/13/2015  . Chronic hip pain (Right) 04/13/2015  . Chronic sacroiliac joint pain (Bilateral) (R>L) 04/13/2015  . Controlled type 2 diabetes mellitus without complication ([place]) 61/44/3154  . Headache disorder 02/01/2015  . Dizziness 02/01/2015  . Bad posture 02/01/2015  . Episodic tension type headache 01/10/2015  . Amnesia 10/18/2014  . 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 Acid reflux 09/24/2013  . Chronic pain 09/24/2013  . Avitaminosis D 02/11/2013    Past Surgical History:  Procedure Laterality Date  . COLONOSCOPY WITH PROPOFOL N/A 05/22/2016   Procedure: COLONOSCOPY WITH PROPOFOL;  Surgeon: Lollie Sails, MD;  Location: [facility] ENDOSCOPY;  Service: Endoscopy;  Laterality: N/A;  . ESOPHAGOGASTRODUODENOSCOPY (EGD) WITH PROPOFOL N/A 05/22/2016   Procedure: ESOPHAGOGASTRODUODENOSCOPY (EGD) WITH PROPOFOL;  Surgeon: Lollie Sails, MD;  Location: [facility] ENDOSCOPY;  Service: Endoscopy;  Laterality: N/A;  . HAND SURGERY Right 2014   Dr Rudene Christians  . rotator cuff surgery Left 03/2013   DR Rudene Christians    Prior to Admission medications   Medication Sig Start Date End Date Taking? Authorizing Provider  acetaminophen (TYLENOL) 500 MG tablet Take 500 mg by mouth every 6 (six) hours as needed.    [provider]  albuterol (PROAIR HFA) 108 (90 BASE) MCG/ACT inhaler Inhale into the lungs every 6 (six) hours as needed for wheezing or shortness of breath.    [provider]  ASPIRIN ADULT LOW STRENGTH PO Take by mouth 1 day or 1 dose.     [provider]  Cholecalciferol (VITAMIN D) 2000 UNITS tablet Take 2,000 Units by mouth daily.    [provider]  Choline Fenofibrate 45 MG capsule Take 45 mg by mouth daily.    [provider]  donepezil (ARICEPT) 5 MG tablet Take 5 mg by mouth at bedtime.    [provider]  FERROUS SULFATE PO Take by mouth 2 (two) times  daily.     [provider]  HYDROCHLOROTHIAZIDE PO Take 25 mg by mouth 1 day or 1 dose.     [provider]  insulin glargine (LANTUS) 100 UNIT/ML injection Inject 43 Units into the skin at bedtime.     [provider]  Insulin Pen Needle (RELION PEN NEEDLE 31G/8MM) 31G X 8 MM MISC by Does not apply route.    [provider]  ISOSORBIDE MONONITRATE PO Take 30 mg by mouth 1 day or 1 dose.     [provider]  loratadine (CLARITIN) 10 MG tablet Take 10 mg by mouth daily.    [provider]  losartan (COZAAR) 25 MG tablet Take 25 mg by mouth daily.    [provider]  meclizine (ANTIVERT) 25 MG tablet Take 1 tablet (25 mg total) by mouth 3 (three) times daily as needed for dizziness. 01/05/15   Orbie Pyo, MD  Multiple Vitamins-Minerals (CENTRUM SILVER ADULT 50+ PO) Take by mouth.    [provider]  nortriptyline (PAMELOR) 10 MG capsule Take 10 mg by mouth at bedtime.    [provider]  omeprazole (PRILOSEC) 20 MG capsule Take 20 mg by mouth daily.    [provider]  oxyCODONE (OXY IR/ROXICODONE) 5 MG immediate release tablet Take 1 tablet (5 mg total) by mouth every 6 (six) hours as needed for severe pain. 07/29/16   Delman Kitten, MD  sitaGLIPtin (JANUVIA) 100 MG tablet Take 100 mg by mouth daily.    [provider]  SUMAtriptan (IMITREX) 100 MG tablet Take 100 mg by mouth every 2 (two) hours as needed for migraine. May repeat in 2 hours if headache persists or recurs.    [provider]  Venlafaxine HCl (EFFEXOR XR PO) Take 37.5 mg by mouth 1 day or 1 dose.     [provider]    Allergies Ace inhibitors; Pollen extracts [pollen extract]; and Vicodin [hydrocodone-acetaminophen]  Family History  Problem Relation Age of Onset  . Diabetes Mother   . Breast cancer Mother 85  . Cancer Sister   . Cancer Father   . Liver disease Brother   . Breast cancer Maternal Aunt    . Breast cancer Cousin        maternal    Social History Social History  Substance Use Topics  . Smoking status: Never Smoker  . Smokeless tobacco: Never Used  . Alcohol use No    Review of Systems  Constitutional: Negative for fever. Eyes: Negative for visual changes. ENT: Negative for sore throat. Neck: No neck pain  Cardiovascular: Negative for chest pain. Respiratory: Negative for shortness of breath. Gastrointestinal: Negative for abdominal pain, vomiting or diarrhea. Genitourinary: Negative for dysuria. Musculoskeletal: Negative for back pain. + b/l LE cramps Skin: Negative for rash. Neurological: Negative for headaches, weakness or numbness. Psych: No SI or HI  ____________________________________________   PHYSICAL EXAM:  VITAL SIGNS: ED Triage Vitals  Enc Vitals Group     BP 08/28/16 0531 126/82     Pulse Rate 08/28/16 0531 60  Resp 08/28/16 0531 17     Temp 08/28/16 0531 97.7 F (36.5 C)     Temp Source 08/28/16 0531 Oral     SpO2 08/28/16 0531 98 %     Weight 08/28/16 0534 160 lb (72.6 kg)     Height 08/28/16 0534 5' (1.524 m)     Head Circumference --      Peak Flow --      Pain Score 08/28/16 0531 8     Pain Loc --      Pain Edu? --      Excl. in [place]? --     Constitutional: Alert and oriented. Well appearing and in no apparent distress. HEENT:      Head: Normocephalic and atraumatic.         Eyes: Conjunctivae are normal. Sclera is non-icteric.       Mouth/Throat: Mucous membranes are moist.       Neck: Supple with no signs of meningismus. Cardiovascular: Regular rate and rhythm. No murmurs, gallops, or rubs. 2+ symmetrical distal pulses are present in all extremities. No JVD. Respiratory: Normal respiratory effort. Lungs are clear to auscultation bilaterally. No wheezes, crackles, or rhonchi.  Gastrointestinal: Soft, non tender, and non distended with positive bowel sounds. No rebound or guarding. Musculoskeletal:RLE > LLE with trace  edema on the R  Neurologic: Normal speech and language. Face is symmetric. Moving all extremities. No gross focal neurologic deficits are appreciated. Skin: Skin is warm, dry and intact. No rash noted. Psychiatric: Mood and affect are normal. Speech and behavior are normal.  ____________________________________________   LABS (all labs ordered are listed, but only abnormal results are displayed)  Labs Reviewed  CBC WITH DIFFERENTIAL/PLATELET - Abnormal; Notable for the following:       Result Value   WBC 11.3 (*)    Lymphs Abs 4.7 (*)    Monocytes Absolute 1.0 (*)    All other components within normal limits  BASIC METABOLIC PANEL - Abnormal; Notable for the following:    Glucose, Bld 139 (*)    BUN 34 (*)    Creatinine, Ser 1.99 (*)    GFR calc non Af Amer 24 (*)    GFR calc Af Amer 28 (*)    All other components within normal limits  CK - Abnormal; Notable for the following:    Total CK 391 (*)    All other components within normal limits  BASIC METABOLIC PANEL - Abnormal; Notable for the following:    Glucose, Bld 165 (*)    BUN 32 (*)    Creatinine, Ser 1.71 (*)    GFR calc non Af Amer 29 (*)    GFR calc Af Amer 33 (*)    All other components within normal limits  CK - Abnormal; Notable for the following:    Total CK 323 (*)    All other components within normal limits   ____________________________________________  EKG  none ____________________________________________  RADIOLOGY  Doppler: No evidence of DVT within either lower extremity ____________________________________________   PROCEDURES  Procedure(s) performed: None Procedures Critical Care performed:  None ____________________________________________   INITIAL IMPRESSION / ASSESSMENT AND PLAN / ED COURSE  71 y.o. female with a history of chronic pain, fibromyalgia, chronic kidney disease, hypertensionwho presents for evaluation of bilateral lower extremity cramps since this evening. Patient  is well-appearing, in no distress, is normal vital signs, normal neurological exam, bilateral lower extremities are warm and well perfused with strong distal pulses. Patient does have trace  pitting edema on the right lower extremity which is bigger than the left lower extremity. Patient does not know if this is acute or chronic, therefore will send patient for Doppler studies. Will check for dehydration, electrolyte abnormalities. We'll give IV fluids. We'll give Tylenol for the pain. Will check total CK since patient is on a statin.  Clinical Course as of Aug 30 833  Tue Aug 28, 2016  0646 Labs showing slightly worsening creatinine with borderline CK. UA is pending. Patient is receiving IV fluids. Plan to repeat CK and creatinine after IV fluids. We'll recommend the patient stop her statin as I have no other source for her elevated CK and f/u with her PCP.  [CV]  3785 Plan to repeat BMP and CK after IVF. UA and Utox pending. Patient receiving IVF. Recommended that patient stop taking statin until f/u with PCP. Care transferred to Dr. Quentin Cornwall.  [CV]    Clinical Course User Index [CV] Rudene Re, MD    Pertinent labs & imaging results that were available during my care of the patient were reviewed by me and considered in my medical decision making (see chart for details).    ____________________________________________   FINAL CLINICAL IMPRESSION(S) / ED DIAGNOSES  Final diagnoses:  Muscle cramps      NEW MEDICATIONS STARTED DURING THIS VISIT:  Discharge Medication List as of 08/28/2016  9:50 AM       Note:  This document was prepared using Dragon voice recognition software and may include unintentional dictation errors.    Alfred Levins, [place], MD 08/30/16 [phone]

## 2016-08-28 NOTE — ED Notes (Signed)
Pt visualized in NAD, resting in bed, lights dimmed for patient comfort. VSS and WNL. Pt noted to have eyes closed. Respirations even and unlabored.

## 2016-08-28 NOTE — ED Triage Notes (Signed)
Pt coming in via EMS from home for bilateral leg cramping. Patient states they woke her up in the middle of the night.

## 2016-08-28 NOTE — Discharge Instructions (Signed)
Stop taking your statin until you follow up with your doctor. Follow up in 2 days. Drink plenty of fluids.

## 2016-08-28 NOTE — ED Notes (Signed)
This RN to bedside at this time. Pt resting in bed comfortably, awakens with mild stimuli. This RN introduced self to patient. Pt is alert and oriented at this time. This RN informed patient that [place] would be coming to get her. Pt states understanding. This RN reviewed proper call bell use with patient if further needs arose. Pt states understanding at this time.

## 2016-08-28 NOTE — ED Notes (Signed)
Pt taken to US at this time

## 2016-10-05 ENCOUNTER — Encounter: Payer: Self-pay | Admitting: *Deleted

## 2016-10-08 ENCOUNTER — Encounter: Payer: Self-pay | Admitting: Anesthesiology

## 2016-10-08 ENCOUNTER — Encounter: Admission: RE | Payer: Self-pay | Source: Ambulatory Visit

## 2016-10-08 ENCOUNTER — Ambulatory Visit: Admission: RE | Admit: 2016-10-08 | Payer: Medicare Other | Source: Ambulatory Visit | Admitting: Gastroenterology

## 2016-10-08 SURGERY — COLONOSCOPY WITH PROPOFOL
Anesthesia: General

## 2016-10-16 ENCOUNTER — Encounter
Admission: RE | Disposition: A | Discharge status: Home / Self Care | Payer: Self-pay | Source: Ambulatory Visit | Attending: Gastroenterology

## 2016-10-16 ENCOUNTER — Ambulatory Visit
Admission: RE | Admit: 2016-10-16 | Discharge: 2016-10-16 | Disposition: A | Discharge status: Home / Self Care | Payer: Medicare Other | Source: Ambulatory Visit | Attending: Gastroenterology | Admitting: Gastroenterology

## 2016-10-16 DIAGNOSIS — R109 Unspecified abdominal pain: Secondary | ICD-10-CM | POA: Insufficient documentation

## 2016-10-16 DIAGNOSIS — Z5309 Procedure and treatment not carried out because of other contraindication: Secondary | ICD-10-CM | POA: Diagnosis not present

## 2016-10-16 DIAGNOSIS — R197 Diarrhea, unspecified: Secondary | ICD-10-CM | POA: Diagnosis present

## 2016-10-16 SURGERY — COLONOSCOPY WITH PROPOFOL
Anesthesia: General

## 2016-10-16 NOTE — H&P (Signed)
Patient presented with c/o "not feeling well" , low grade fever and upper respiratory symptoms.  I discussed with patient and we will hold procedures today, plan to reschedule, make arrangements  For her to see her primary MD>

## 2016-10-16 NOTE — Progress Notes (Signed)
Low grade fever noted in pre op. Patient states "I didn't feel well all week." cough noted. Dr. Gustavo Lah and Dr. Kayleen Memos notified. Procedure cancelled and discussed with patient. Family member noted and patient referred to her primary MD.

## 2016-11-28 ENCOUNTER — Other Ambulatory Visit: Payer: Self-pay | Admitting: Orthopedic Surgery

## 2016-11-28 DIAGNOSIS — M75101 Unspecified rotator cuff tear or rupture of right shoulder, not specified as traumatic: Secondary | ICD-10-CM

## 2016-12-05 ENCOUNTER — Ambulatory Visit: Payer: Medicare Other

## 2016-12-17 ENCOUNTER — Ambulatory Visit
Admission: RE | Admit: 2016-12-17 | Discharge: 2016-12-17 | Disposition: A | Discharge status: Home / Self Care | Payer: Medicare Other | Source: Ambulatory Visit | Attending: Orthopedic Surgery | Admitting: Orthopedic Surgery

## 2016-12-17 DIAGNOSIS — M75101 Unspecified rotator cuff tear or rupture of right shoulder, not specified as traumatic: Secondary | ICD-10-CM | POA: Diagnosis present

## 2016-12-26 ENCOUNTER — Emergency Department
Admission: EM | Admit: 2016-12-26 | Discharge: 2016-12-26 | Disposition: A | Discharge status: Home / Self Care | Payer: Medicare Other | Attending: Emergency Medicine | Admitting: Emergency Medicine

## 2016-12-26 ENCOUNTER — Encounter: Payer: Self-pay | Admitting: Intensive Care

## 2016-12-26 DIAGNOSIS — Y939 Activity, unspecified: Secondary | ICD-10-CM | POA: Diagnosis not present

## 2016-12-26 DIAGNOSIS — E1122 Type 2 diabetes mellitus with diabetic chronic kidney disease: Secondary | ICD-10-CM | POA: Diagnosis not present

## 2016-12-26 DIAGNOSIS — J45909 Unspecified asthma, uncomplicated: Secondary | ICD-10-CM | POA: Insufficient documentation

## 2016-12-26 DIAGNOSIS — Z794 Long term (current) use of insulin: Secondary | ICD-10-CM | POA: Diagnosis not present

## 2016-12-26 DIAGNOSIS — G8929 Other chronic pain: Secondary | ICD-10-CM | POA: Diagnosis not present

## 2016-12-26 DIAGNOSIS — N183 Chronic kidney disease, stage 3 (moderate): Secondary | ICD-10-CM | POA: Diagnosis not present

## 2016-12-26 DIAGNOSIS — Z79899 Other long term (current) drug therapy: Secondary | ICD-10-CM | POA: Diagnosis not present

## 2016-12-26 DIAGNOSIS — S3992XA Unspecified injury of lower back, initial encounter: Secondary | ICD-10-CM | POA: Diagnosis present

## 2016-12-26 DIAGNOSIS — S39012A Strain of muscle, fascia and tendon of lower back, initial encounter: Secondary | ICD-10-CM | POA: Insufficient documentation

## 2016-12-26 DIAGNOSIS — Z7982 Long term (current) use of aspirin: Secondary | ICD-10-CM | POA: Insufficient documentation

## 2016-12-26 DIAGNOSIS — X501XXA Overexertion from prolonged static or awkward postures, initial encounter: Secondary | ICD-10-CM | POA: Diagnosis not present

## 2016-12-26 DIAGNOSIS — I129 Hypertensive chronic kidney disease with stage 1 through stage 4 chronic kidney disease, or unspecified chronic kidney disease: Secondary | ICD-10-CM | POA: Insufficient documentation

## 2016-12-26 DIAGNOSIS — Y999 Unspecified external cause status: Secondary | ICD-10-CM | POA: Insufficient documentation

## 2016-12-26 DIAGNOSIS — Y929 Unspecified place or not applicable: Secondary | ICD-10-CM | POA: Insufficient documentation

## 2016-12-26 MED ORDER — TRAMADOL-ACETAMINOPHEN 37.5-325 MG PO TABS: 1.0000 | ORAL_TABLET | Freq: Four times a day (QID) | ORAL | 0 refills | Status: DC | PRN

## 2016-12-26 MED ORDER — TRAMADOL HCL 50 MG PO TABS
50.0000 mg | ORAL_TABLET | Freq: Once | ORAL | Status: AC
  Administered 2016-12-26: 50 mg via ORAL
  Filled 2016-12-26: qty 1

## 2016-12-26 MED ORDER — CYCLOBENZAPRINE HCL 10 MG PO TABS
5.0000 mg | ORAL_TABLET | Freq: Once | ORAL | Status: AC
  Administered 2016-12-26: 5 mg via ORAL
  Filled 2016-12-26: qty 1

## 2016-12-26 NOTE — ED Triage Notes (Signed)
Patient reports waking up yesterday with lower back pain with no known injury. Reports HX of back issues.

## 2016-12-26 NOTE — ED Provider Notes (Signed)
[facility] Emergency Department Provider Note   ____________________________________________   None    (approximate)  I have reviewed the triage vital signs and the nursing notes.   HISTORY  Chief Complaint Back Pain (lower)    HPI Theresa Booker is a 71 y.o. female patient complaining of left low back pain with a.m. awakening yesterday. Patient stated no provocative incident for her complaint. Patient does has a history of chronic back pain secondary to degenerative changes of the lumbar spine. Patient denies radicular component to her back pain. Patient rates the pain as a 10 over 10. Patient straddled pain is aching/stabbing sensation". No palliative measures for complaint.   Past Medical History:  Diagnosis Date  . Asthma   . Atypical chest pain    neg ETT at [facility]  . CKD (chronic kidney disease), stage III ([place])   . Depression   . Diabetes mellitus without complication ([place])   . Fibromyalgia   . HLD (hyperlipidemia)   . Hypertension   . Vitamin D deficiency     Patient Active Problem List   Diagnosis Date Noted  . Cocaine use 05/03/2015  . Abnormal MRI, lumbar spine 05/03/2015  . Numbness in feet 04/29/2015  . Gait instability 04/29/2015  . BP (high blood pressure) 04/13/2015  . Fibromyalgia 04/13/2015  . Essential (primary) hypertension 04/13/2015  . Clinical depression 04/13/2015  . Chronic kidney disease (CKD), stage III (moderate) ([place]) 04/13/2015  . Airway hyperreactivity 04/13/2015  . Chronic knee pain (Right) 04/13/2015  . Chronic low back pain (Location of Secondary source of pain) (Bilateral) (R>L) 04/13/2015  . Chronic lower straight pain (Location of Primary Source of Pain) (Right) 04/13/2015  . Chronic lumbar radicular pain (Right) (L5 Dermatome) 04/13/2015  . At high risk for falls 04/13/2015  . Chronic hip pain (Right) 04/13/2015  . Chronic sacroiliac joint pain (Bilateral) (R>L) 04/13/2015  . Controlled type 2  diabetes mellitus without complication ([place]) 65/99/3570  . Headache disorder 02/01/2015  . Dizziness 02/01/2015  . Bad posture 02/01/2015  . Episodic tension type headache 01/10/2015  . Amnesia 10/18/2014  . 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 Acid reflux 09/24/2013  . Chronic pain 09/24/2013  . Avitaminosis D 02/11/2013    Past Surgical History:  Procedure Laterality Date  . ABDOMINAL HYSTERECTOMY    . COLONOSCOPY WITH PROPOFOL N/A 05/22/2016   Procedure: COLONOSCOPY WITH PROPOFOL;  Surgeon: Lollie Sails, MD;  [facility] ENDOSCOPY;  Service: Endoscopy;  Laterality: N/A;  . ESOPHAGOGASTRODUODENOSCOPY (EGD) WITH PROPOFOL N/A 05/22/2016   Procedure: ESOPHAGOGASTRODUODENOSCOPY (EGD) WITH PROPOFOL;  Surgeon: Lollie Sails, MD;  Location: [facility] ENDOSCOPY;  Service: Endoscopy;  Laterality: N/A;  . HAND SURGERY Right 2014   Dr Rudene Christians  . rotator cuff surgery Left 03/2013   DR Rudene Christians    Prior to Admission medications   Medication Sig Start Date End Date Taking? Authorizing Provider  acetaminophen (TYLENOL) 500 MG tablet Take 500 mg by mouth every 6 (six) hours as needed.    [provider]  albuterol (PROAIR HFA) 108 (90 BASE) MCG/ACT inhaler Inhale into the lungs every 6 (six) hours as needed for wheezing or shortness of breath.    [provider]  ASPIRIN ADULT LOW STRENGTH PO Take by mouth 1 day or 1 dose.     [provider]  Cholecalciferol (VITAMIN D) 2000 UNITS tablet Take 2,000 Units by mouth daily.    [provider]  Choline Fenofibrate 45 MG capsule  Take 45 mg by mouth daily.    [provider]  donepezil (ARICEPT) 5 MG tablet Take 5 mg by mouth at bedtime.    [provider]  FERROUS SULFATE PO Take by mouth 2 (two) times daily.     [provider]  HYDROCHLOROTHIAZIDE PO Take 25 mg by mouth 1 day or 1 dose.     [provider]  insulin glargine (LANTUS) 100 UNIT/ML  injection Inject 43 Units into the skin at bedtime.     [provider]  Insulin Pen Needle (RELION PEN NEEDLE 31G/8MM) 31G X 8 MM MISC by Does not apply route.    [provider]  ISOSORBIDE MONONITRATE PO Take 30 mg by mouth 1 day or 1 dose.     [provider]  loratadine (CLARITIN) 10 MG tablet Take 10 mg by mouth daily.    [provider]  losartan (COZAAR) 25 MG tablet Take 25 mg by mouth daily.    [provider]  meclizine (ANTIVERT) 25 MG tablet Take 1 tablet (25 mg total) by mouth 3 (three) times daily as needed for dizziness. 01/05/15   Orbie Pyo, MD  Multiple Vitamins-Minerals (CENTRUM SILVER ADULT 50+ PO) Take by mouth.    [provider]  nortriptyline (PAMELOR) 10 MG capsule Take 10 mg by mouth at bedtime.    [provider]  omeprazole (PRILOSEC) 20 MG capsule Take 20 mg by mouth daily.    [provider]  oxyCODONE (OXY IR/ROXICODONE) 5 MG immediate release tablet Take 1 tablet (5 mg total) by mouth every 6 (six) hours as needed for severe pain. 07/29/16   Delman Kitten, MD  sitaGLIPtin (JANUVIA) 100 MG tablet Take 100 mg by mouth daily.    [provider]  SUMAtriptan (IMITREX) 100 MG tablet Take 100 mg by mouth every 2 (two) hours as needed for migraine. May repeat in 2 hours if headache persists or recurs.    [provider]  traMADol-acetaminophen (ULTRACET) 37.5-325 MG tablet Take 1 tablet by mouth every 6 (six) hours as needed. 12/26/16   Sable Feil, PA-C  traMADol-acetaminophen (ULTRACET) 37.5-325 MG tablet Take 1 tablet by mouth every 6 (six) hours as needed. 12/26/16   Sable Feil, PA-C  Venlafaxine HCl (EFFEXOR XR PO) Take 37.5 mg by mouth 1 day or 1 dose.     [provider]    Allergies Ace inhibitors; Pollen extracts [pollen extract]; and Vicodin [hydrocodone-acetaminophen]  Family History  Problem Relation Age of Onset  . Diabetes Mother   .  Breast cancer Mother 37  . Cancer Sister   . Cancer Father   . Liver disease Brother   . Breast cancer Maternal Aunt   . Breast cancer Cousin        maternal    Social History Social History  Substance Use Topics  . Smoking status: Never Smoker  . Smokeless tobacco: Never Used  . Alcohol use No    Review of Systems Constitutional: No fever/chills Eyes: No visual changes. ENT: No sore throat. Cardiovascular: Denies chest pain. Respiratory: Denies shortness of breath. Gastrointestinal: No abdominal pain.  No nausea, no vomiting.  No diarrhea.  No constipation. Genitourinary: Negative for dysuria. Musculoskeletal: Positive for chronic back pain Skin: Negative for rash. Neurological: Negative for headaches, focal weakness or numbness. Psychiatric: Depression and fibromyalgia. Endocrine:Diabetes, hyperlipidemia, and hypertension. Hematological/Lymphatic: Allergic/Immunilogical: See medication list ____________________________________________   PHYSICAL EXAM:  VITAL SIGNS: ED Triage Vitals  Enc Vitals  Group     BP 12/26/16 1309 117/78     Pulse Rate 12/26/16 1309 81     Resp 12/26/16 1309 16     Temp 12/26/16 1309 97.8 F (36.6 C)     Temp Source 12/26/16 1309 Oral     SpO2 12/26/16 1309 97 %     Weight 12/26/16 1310 155 lb (70.3 kg)     Height 12/26/16 1310 5' (1.524 m)     Head Circumference --      Peak Flow --      Pain Score 12/26/16 1317 10     Pain Loc --      Pain Edu? --      Excl. in [place]? --     Constitutional: Alert and oriented. Well appearing and in no acute distress. Cardiovascular: Normal rate, regular rhythm. Grossly normal heart sounds.  Good peripheral circulation. Respiratory: Normal respiratory effort.  No retractions. Lungs CTAB. Musculoskeletal: No obvious spinal deformity. Patient is moderate guarding palpation left lateral paraspinal muscle area.  Neurologic:  Normal speech and language. No gross focal neurologic deficits are  appreciated. No gait instability. Skin:  Skin is warm, dry and intact. No rash noted. Psychiatric: Mood and affect are normal. Speech and behavior are normal.  ____________________________________________   LABS (all labs ordered are listed, but only abnormal results are displayed)  Labs Reviewed - No data to display ____________________________________________  EKG   ____________________________________________  RADIOLOGY  No results found.  _Reviewed the MRI taken the lumbar spine 05/03/2015. ___________________________________________   PROCEDURES  Procedure(s) performed: None  Procedures  Critical Care performed: No  ____________________________________________   INITIAL IMPRESSION / ASSESSMENT AND PLAN / ED COURSE  As part of my medical decision making, I reviewed the following data within the electronic MEDICAL RECORD NUMBER Notes from prior ED visits and [place] Controlled Substance Database   Acute low back pain secondary to strain. Patient given discharge Instructions. Patient advised to take medication as directed and follow with PCP.      ____________________________________________   FINAL CLINICAL IMPRESSION(S) / ED DIAGNOSES  Final diagnoses:  Strain of lumbar region, initial encounter      NEW MEDICATIONS STARTED DURING THIS VISIT:  New Prescriptions   TRAMADOL-ACETAMINOPHEN (ULTRACET) 37.5-325 MG TABLET    Take 1 tablet by mouth every 6 (six) hours as needed.   TRAMADOL-ACETAMINOPHEN (ULTRACET) 37.5-325 MG TABLET    Take 1 tablet by mouth every 6 (six) hours as needed.     Note:  This document was prepared using Dragon voice recognition software and may include unintentional dictation errors.    Sable Feil, PA-C 12/26/16 1411    Earleen [place], MD 12/26/16 [phone]

## 2017-01-07 ENCOUNTER — Ambulatory Visit: Payer: Medicare Other | Attending: Family Medicine | Admitting: Physical Therapy

## 2017-01-09 ENCOUNTER — Encounter: Payer: Medicare Other | Admitting: Physical Therapy

## 2017-01-14 ENCOUNTER — Ambulatory Visit: Payer: Medicare Other | Admitting: Physical Therapy

## 2017-01-16 ENCOUNTER — Encounter: Payer: Medicare Other | Admitting: Physical Therapy

## 2017-04-01 DIAGNOSIS — M7581 Other shoulder lesions, right shoulder: Secondary | ICD-10-CM | POA: Insufficient documentation

## 2017-04-01 DIAGNOSIS — S46011A Strain of muscle(s) and tendon(s) of the rotator cuff of right shoulder, initial encounter: Secondary | ICD-10-CM | POA: Insufficient documentation

## 2017-04-05 DIAGNOSIS — Z6831 Body mass index (BMI) 31.0-31.9, adult: Secondary | ICD-10-CM

## 2017-04-05 HISTORY — DX: Body mass index (BMI) 31.0-31.9, adult: Z68.31

## 2017-04-10 ENCOUNTER — Other Ambulatory Visit: Payer: Self-pay | Admitting: Nurse Practitioner

## 2017-04-10 DIAGNOSIS — Z1231 Encounter for screening mammogram for malignant neoplasm of breast: Secondary | ICD-10-CM

## 2017-04-16 ENCOUNTER — Inpatient Hospital Stay: Admission: RE | Admit: 2017-04-16 | Payer: Medicare Other | Source: Ambulatory Visit

## 2017-04-18 ENCOUNTER — Encounter
Admission: RE | Admit: 2017-04-18 | Discharge: 2017-04-18 | Disposition: A | Discharge status: Home / Self Care | Payer: Medicare Other | Source: Ambulatory Visit | Attending: Surgery | Admitting: Surgery

## 2017-04-18 ENCOUNTER — Other Ambulatory Visit: Payer: Self-pay

## 2017-04-18 NOTE — Patient Instructions (Signed)
Your procedure is scheduled on: Thurs. 04/25/17 Report to Day Surgery. To find out your arrival time please call [phone] between [facility] on Wed. 04/24/17  Remember: Instructions that are not followed completely may result in serious medical risk, up to and including death, or upon the discretion of your surgeon and anesthesiologist your surgery may need to be rescheduled.     _X__ 1. Do not eat food after midnight the night before your procedure.                 No gum chewing or hard candies. You may drink clear liquids up to 2 hours                 before you are scheduled to arrive for your surgery- DO not drink clear                 liquids within 2 hours of the start of your surgery.                 Clear Liquids include:  water, apple juice without pulp, clear carbohydrate                 drink such as Clearfast of Gartorade, Black Coffee or Tea (Do not add                 anything to coffee or tea).  __X__2.  On the morning of surgery brush your teeth with toothpaste and water, you  may rinse your mouth with mouthwash if you wish.  Do not swallow any              toothpaste of mouthwash.     __ 3.  No Alcohol for 24 hours before or after surgery.   __ 4.  Do Not Smoke or use e-cigarettes For 24 Hours Prior to Your Surgery.                 Do not use any chewable tobacco products for at least 6 hours prior to                 surgery.  ____  5.  Bring all medications with you on the day of surgery if instructed.   __x__  6.  Notify your doctor if there is any change in your medical condition      (cold, fever, infections).     Do not wear jewelry, make-up, hairpins, clips or nail polish. Do not wear lotions, powders, or perfumes. You may wear deodorant. Do not shave 48 hours prior to surgery. Men may shave face and neck. Do not bring valuables to the hospital.    [facility] is not responsible for any belongings or valuables.  Contacts, dentures or  bridgework may not be worn into surgery. Leave your suitcase in the car. After surgery it may be brought to your room. For patients admitted to the hospital, discharge time is determined by your treatment team.   Patients discharged the day of surgery will not be allowed to drive home.   Please read over the following fact sheets that you were given:    __x__ Take these medicines the morning of surgery with A SIP OF WATER:    1. acetaminophen (TYLENOL) 500 MG tablet  2. loratadine (CLARITIN) 10 MG tablet  3. omeprazole (PRILOSEC) 20 MG capsule take extra dose the night before surgery and the morning of surgery  4.venlafaxine XR (EFFEXOR-XR) 75 MG 24  hr capsule  5.  6.  ____ Fleet Enema (as directed)   ____ Use CHG Soap as directed  _x___ Use inhalers on the day of surgeryalbuterol (PROAIR HFA) 108 (90 BASE) MCG/ACT inhaler  ____ Stop metformin 2 days prior to surgery    __x__ Take 1/2 of usual insulin dose the night before surgery. No insulin the morning          of surgery.   ____ Stop Coumadin/Plavix/aspirin on   ____ Stop Anti-inflammatories on    ____ Stop supplements until after surgery.    ____ Bring C-Pap to the hospital.

## 2017-04-18 NOTE — Pre-Procedure Instructions (Signed)
Clearance request sent to [facility] clinic.  Will do EKG, Met B on Monday and will send results of those and recent CBC results with clearance form after that appointment.

## 2017-04-24 MED ORDER — CEFAZOLIN SODIUM-DEXTROSE 2-4 GM/100ML-% IV SOLN
2.0000 g | Freq: Once | INTRAVENOUS | Status: AC
  Administered 2017-04-25: 2 g via INTRAVENOUS

## 2017-04-24 NOTE — Pre-Procedure Instructions (Addendum)
[facility] 04/23/17 AND NOTE ON CHART. CALLED FOR CLEARANCE FROM [place]

## 2017-04-25 ENCOUNTER — Encounter
Admission: RE | Disposition: A | Discharge status: Home / Self Care | Payer: Self-pay | Source: Ambulatory Visit | Attending: Surgery

## 2017-04-25 ENCOUNTER — Ambulatory Visit
Admission: RE | Admit: 2017-04-25 | Discharge: 2017-04-25 | Disposition: A | Discharge status: Home / Self Care | Payer: Medicare Other | Source: Ambulatory Visit | Attending: Surgery | Admitting: Surgery

## 2017-04-25 ENCOUNTER — Ambulatory Visit: Payer: Medicare Other | Admitting: Anesthesiology

## 2017-04-25 ENCOUNTER — Other Ambulatory Visit: Payer: Self-pay

## 2017-04-25 DIAGNOSIS — J45909 Unspecified asthma, uncomplicated: Secondary | ICD-10-CM | POA: Insufficient documentation

## 2017-04-25 DIAGNOSIS — M75121 Complete rotator cuff tear or rupture of right shoulder, not specified as traumatic: Secondary | ICD-10-CM | POA: Insufficient documentation

## 2017-04-25 DIAGNOSIS — M797 Fibromyalgia: Secondary | ICD-10-CM | POA: Insufficient documentation

## 2017-04-25 DIAGNOSIS — E785 Hyperlipidemia, unspecified: Secondary | ICD-10-CM | POA: Insufficient documentation

## 2017-04-25 DIAGNOSIS — F329 Major depressive disorder, single episode, unspecified: Secondary | ICD-10-CM | POA: Diagnosis not present

## 2017-04-25 DIAGNOSIS — Z7982 Long term (current) use of aspirin: Secondary | ICD-10-CM | POA: Insufficient documentation

## 2017-04-25 DIAGNOSIS — M7541 Impingement syndrome of right shoulder: Secondary | ICD-10-CM | POA: Insufficient documentation

## 2017-04-25 DIAGNOSIS — N183 Chronic kidney disease, stage 3 (moderate): Secondary | ICD-10-CM | POA: Diagnosis not present

## 2017-04-25 DIAGNOSIS — Z794 Long term (current) use of insulin: Secondary | ICD-10-CM | POA: Diagnosis not present

## 2017-04-25 DIAGNOSIS — K219 Gastro-esophageal reflux disease without esophagitis: Secondary | ICD-10-CM | POA: Diagnosis not present

## 2017-04-25 DIAGNOSIS — Z79891 Long term (current) use of opiate analgesic: Secondary | ICD-10-CM | POA: Diagnosis not present

## 2017-04-25 DIAGNOSIS — Z7951 Long term (current) use of inhaled steroids: Secondary | ICD-10-CM | POA: Diagnosis not present

## 2017-04-25 DIAGNOSIS — Z7984 Long term (current) use of oral hypoglycemic drugs: Secondary | ICD-10-CM | POA: Diagnosis not present

## 2017-04-25 DIAGNOSIS — I129 Hypertensive chronic kidney disease with stage 1 through stage 4 chronic kidney disease, or unspecified chronic kidney disease: Secondary | ICD-10-CM | POA: Diagnosis not present

## 2017-04-25 DIAGNOSIS — E1122 Type 2 diabetes mellitus with diabetic chronic kidney disease: Secondary | ICD-10-CM | POA: Insufficient documentation

## 2017-04-25 DIAGNOSIS — M24111 Other articular cartilage disorders, right shoulder: Secondary | ICD-10-CM | POA: Insufficient documentation

## 2017-04-25 DIAGNOSIS — M67813 Other specified disorders of tendon, right shoulder: Secondary | ICD-10-CM | POA: Insufficient documentation

## 2017-04-25 DIAGNOSIS — Z79899 Other long term (current) drug therapy: Secondary | ICD-10-CM | POA: Diagnosis not present

## 2017-04-25 HISTORY — PX: SHOULDER ARTHROSCOPY WITH OPEN ROTATOR CUFF REPAIR: SHX6092

## 2017-04-25 LAB — POCT I-STAT 4, (NA,K, GLUC, HGB,HCT)
Glucose, Bld: 67 mg/dL (ref 65–99)
HCT: 36 % (ref 36.0–46.0)
Hemoglobin: 12.2 g/dL (ref 12.0–15.0)
POTASSIUM: 3.6 mmol/L (ref 3.5–5.1)
Sodium: 143 mmol/L (ref 135–145)

## 2017-04-25 LAB — GLUCOSE, CAPILLARY
Glucose-Capillary: 53 mg/dL — ABNORMAL LOW (ref 65–99)
Glucose-Capillary: 43 mg/dL — CL (ref 65–99)
Glucose-Capillary: 164 mg/dL — ABNORMAL HIGH (ref 65–99)
Glucose-Capillary: 139 mg/dL — ABNORMAL HIGH (ref 65–99)
Glucose-Capillary: 133 mg/dL — ABNORMAL HIGH (ref 65–99)

## 2017-04-25 SURGERY — ARTHROSCOPY, SHOULDER WITH REPAIR, ROTATOR CUFF, OPEN
Anesthesia: Regional | Site: Shoulder | Laterality: Right | Wound class: Clean

## 2017-04-25 MED ORDER — BUPIVACAINE-EPINEPHRINE 0.5% -1:200000 IJ SOLN
INTRAMUSCULAR | Status: DC | PRN
  Administered 2017-04-25: 30 mL

## 2017-04-25 MED ORDER — ONDANSETRON HCL 4 MG PO TABS: 4.0000 mg | ORAL_TABLET | Freq: Four times a day (QID) | ORAL | Status: DC | PRN

## 2017-04-25 MED ORDER — CEFAZOLIN SODIUM-DEXTROSE 2-4 GM/100ML-% IV SOLN
INTRAVENOUS | Status: AC
  Filled 2017-04-25: qty 100

## 2017-04-25 MED ORDER — SUGAMMADEX SODIUM 200 MG/2ML IV SOLN
INTRAVENOUS | Status: DC | PRN
  Administered 2017-04-25: 200 mg via INTRAVENOUS

## 2017-04-25 MED ORDER — LIDOCAINE HCL (PF) 1 % IJ SOLN
INTRAMUSCULAR | Status: AC
  Filled 2017-04-25: qty 5

## 2017-04-25 MED ORDER — PHENYLEPHRINE HCL 10 MG/ML IJ SOLN
INTRAMUSCULAR | Status: DC | PRN
  Administered 2017-04-25 (×2): 100 ug via INTRAVENOUS

## 2017-04-25 MED ORDER — SUGAMMADEX SODIUM 200 MG/2ML IV SOLN
INTRAVENOUS | Status: AC
  Filled 2017-04-25: qty 2

## 2017-04-25 MED ORDER — DEXTROSE 5 % IV SOLN
Freq: Once | INTRAVENOUS | Status: AC
  Administered 2017-04-25: 12:00:00 via INTRAVENOUS

## 2017-04-25 MED ORDER — MIDAZOLAM HCL 2 MG/2ML IJ SOLN: 0.5000 mg | Freq: Once | INTRAMUSCULAR | Status: DC | PRN

## 2017-04-25 MED ORDER — FENTANYL CITRATE (PF) 100 MCG/2ML IJ SOLN
INTRAMUSCULAR | Status: AC
  Filled 2017-04-25: qty 2

## 2017-04-25 MED ORDER — PROMETHAZINE HCL 25 MG/ML IJ SOLN: 6.2500 mg | INTRAMUSCULAR | Status: DC | PRN

## 2017-04-25 MED ORDER — FENTANYL CITRATE (PF) 100 MCG/2ML IJ SOLN: 25.0000 ug | INTRAMUSCULAR | Status: DC | PRN

## 2017-04-25 MED ORDER — ONDANSETRON HCL 4 MG/2ML IJ SOLN
INTRAMUSCULAR | Status: AC
  Filled 2017-04-25: qty 2

## 2017-04-25 MED ORDER — EPHEDRINE SULFATE 50 MG/ML IJ SOLN
15.0000 mg | Freq: Once | INTRAMUSCULAR | Status: AC
  Administered 2017-04-25: 15 mg via INTRAVENOUS

## 2017-04-25 MED ORDER — PROPOFOL 10 MG/ML IV BOLUS
INTRAVENOUS | Status: DC | PRN
  Administered 2017-04-25: 150 mg via INTRAVENOUS

## 2017-04-25 MED ORDER — BUPIVACAINE LIPOSOME 1.3 % IJ SUSP
INTRAMUSCULAR | Status: AC
  Filled 2017-04-25: qty 20

## 2017-04-25 MED ORDER — METOCLOPRAMIDE HCL 5 MG/ML IJ SOLN: 5.0000 mg | Freq: Three times a day (TID) | INTRAMUSCULAR | Status: DC | PRN

## 2017-04-25 MED ORDER — MIDAZOLAM HCL 2 MG/2ML IJ SOLN
1.0000 mg | Freq: Once | INTRAMUSCULAR | Status: AC
  Administered 2017-04-25: 1 mg via INTRAVENOUS

## 2017-04-25 MED ORDER — DEXAMETHASONE SODIUM PHOSPHATE 10 MG/ML IJ SOLN
INTRAMUSCULAR | Status: AC
  Filled 2017-04-25: qty 1

## 2017-04-25 MED ORDER — LIDOCAINE 2% (20 MG/ML) 5 ML SYRINGE
INTRAMUSCULAR | Status: DC | PRN
  Administered 2017-04-25: 100 mg via INTRAVENOUS

## 2017-04-25 MED ORDER — SODIUM CHLORIDE 0.9 % IV SOLN
INTRAVENOUS | Status: DC
  Administered 2017-04-25: 12:00:00 via INTRAVENOUS

## 2017-04-25 MED ORDER — BUPIVACAINE HCL (PF) 0.5 % IJ SOLN
INTRAMUSCULAR | Status: AC
  Filled 2017-04-25: qty 10

## 2017-04-25 MED ORDER — FENTANYL CITRATE (PF) 100 MCG/2ML IJ SOLN
INTRAMUSCULAR | Status: AC
  Administered 2017-04-25: 50 ug via INTRAVENOUS
  Filled 2017-04-25: qty 2

## 2017-04-25 MED ORDER — LACTATED RINGERS IV SOLN
INTRAVENOUS | Status: DC | PRN
  Administered 2017-04-25: 2 mL

## 2017-04-25 MED ORDER — SODIUM CHLORIDE FLUSH 0.9 % IV SOLN
INTRAVENOUS | Status: AC
  Filled 2017-04-25: qty 10

## 2017-04-25 MED ORDER — FENTANYL CITRATE (PF) 100 MCG/2ML IJ SOLN
INTRAMUSCULAR | Status: DC | PRN
  Administered 2017-04-25: 100 ug via INTRAVENOUS

## 2017-04-25 MED ORDER — FENTANYL CITRATE (PF) 100 MCG/2ML IJ SOLN
50.0000 ug | Freq: Once | INTRAMUSCULAR | Status: AC
  Administered 2017-04-25: 50 ug via INTRAVENOUS

## 2017-04-25 MED ORDER — PHENYLEPHRINE HCL 10 MG/ML IJ SOLN
INTRAVENOUS | Status: DC | PRN
  Administered 2017-04-25: 50 ug/min via INTRAVENOUS

## 2017-04-25 MED ORDER — OXYCODONE HCL 5 MG PO TABS
5.0000 mg | ORAL_TABLET | ORAL | Status: DC | PRN
  Filled 2017-04-25: qty 2

## 2017-04-25 MED ORDER — BUPIVACAINE-EPINEPHRINE (PF) 0.5% -1:200000 IJ SOLN
INTRAMUSCULAR | Status: AC
  Filled 2017-04-25: qty 30

## 2017-04-25 MED ORDER — OXYCODONE HCL 5 MG PO TABS: 5.0000 mg | ORAL_TABLET | ORAL | 0 refills | Status: DC | PRN

## 2017-04-25 MED ORDER — EPHEDRINE SULFATE 50 MG/ML IJ SOLN
INTRAMUSCULAR | Status: DC | PRN
  Administered 2017-04-25: 10 mg via INTRAVENOUS

## 2017-04-25 MED ORDER — DEXAMETHASONE SODIUM PHOSPHATE 10 MG/ML IJ SOLN
INTRAMUSCULAR | Status: DC | PRN
  Administered 2017-04-25: 10 mg via INTRAVENOUS

## 2017-04-25 MED ORDER — PROPOFOL 10 MG/ML IV BOLUS
INTRAVENOUS | Status: AC
  Filled 2017-04-25: qty 20

## 2017-04-25 MED ORDER — ONDANSETRON HCL 4 MG/2ML IJ SOLN: 4.0000 mg | Freq: Four times a day (QID) | INTRAMUSCULAR | Status: DC | PRN

## 2017-04-25 MED ORDER — EPINEPHRINE PF 1 MG/ML IJ SOLN
INTRAMUSCULAR | Status: AC
  Filled 2017-04-25: qty 2

## 2017-04-25 MED ORDER — ONDANSETRON HCL 4 MG/2ML IJ SOLN
INTRAMUSCULAR | Status: DC | PRN
  Administered 2017-04-25: 4 mg via INTRAVENOUS

## 2017-04-25 MED ORDER — MIDAZOLAM HCL 2 MG/2ML IJ SOLN
INTRAMUSCULAR | Status: AC
  Administered 2017-04-25: 1 mg via INTRAVENOUS
  Filled 2017-04-25: qty 2

## 2017-04-25 MED ORDER — SUCCINYLCHOLINE CHLORIDE 20 MG/ML IJ SOLN
INTRAMUSCULAR | Status: DC | PRN
  Administered 2017-04-25: 100 mg via INTRAVENOUS

## 2017-04-25 MED ORDER — ROCURONIUM BROMIDE 100 MG/10ML IV SOLN
INTRAVENOUS | Status: DC | PRN
  Administered 2017-04-25: 10 mg via INTRAVENOUS
  Administered 2017-04-25: 20 mg via INTRAVENOUS

## 2017-04-25 MED ORDER — SUCCINYLCHOLINE CHLORIDE 20 MG/ML IJ SOLN
INTRAMUSCULAR | Status: AC
  Filled 2017-04-25: qty 1

## 2017-04-25 MED ORDER — MEPERIDINE HCL 50 MG/ML IJ SOLN: 6.2500 mg | INTRAMUSCULAR | Status: DC | PRN

## 2017-04-25 MED ORDER — ROCURONIUM BROMIDE 50 MG/5ML IV SOLN
INTRAVENOUS | Status: AC
  Filled 2017-04-25: qty 1

## 2017-04-25 MED ORDER — LIDOCAINE HCL (PF) 2 % IJ SOLN
INTRAMUSCULAR | Status: AC
  Filled 2017-04-25: qty 10

## 2017-04-25 MED ORDER — METOCLOPRAMIDE HCL 10 MG PO TABS: 5.0000 mg | ORAL_TABLET | Freq: Three times a day (TID) | ORAL | Status: DC | PRN

## 2017-04-25 MED ORDER — POTASSIUM CHLORIDE IN NACL 20-0.9 MEQ/L-% IV SOLN
INTRAVENOUS | Status: DC
  Filled 2017-04-25 (×5): qty 1000

## 2017-04-25 MED ORDER — DEXTROSE-NACL 5-0.9 % IV SOLN
INTRAVENOUS | Status: DC
  Administered 2017-04-25 (×2): via INTRAVENOUS

## 2017-04-25 MED ORDER — EPHEDRINE SULFATE 50 MG/ML IJ SOLN
INTRAMUSCULAR | Status: AC
  Administered 2017-04-25: 15 mg via INTRAVENOUS
  Filled 2017-04-25: qty 1

## 2017-04-25 SURGICAL SUPPLY — 44 items
ANCH SUT 2 2.9 2 LD TPR NDL (Anchor) ×3 IMPLANT
ANCH SUT KNTLS STRL SHLDR SYS (Anchor) ×2 IMPLANT
ANCHOR JUGGERKNOT WTAP NDL 2.9 (Anchor) ×6 IMPLANT
ANCHOR SUT QUATTRO KNTLS 4.5 (Anchor) ×4 IMPLANT
BIT DRILL JUGRKNT W/NDL BIT2.9 (DRILL) IMPLANT
BLADE FULL RADIUS 3.5 (BLADE) ×3 IMPLANT
BUR ACROMIONIZER 4.0 (BURR) ×3 IMPLANT
CANNULA SHAVER 8MMX76MM (CANNULA) ×3 IMPLANT
CHLORAPREP W/TINT 26ML (MISCELLANEOUS) ×3 IMPLANT
COVER MAYO STAND STRL (DRAPES) ×3 IMPLANT
DRAPE IMP U-DRAPE 54X76 (DRAPES) ×6 IMPLANT
DRILL JUGGERKNOT W/NDL BIT 2.9 (DRILL) ×3
ELECT REM PT RETURN 9FT ADLT (ELECTROSURGICAL) ×3
ELECTRODE REM PT RTRN 9FT ADLT (ELECTROSURGICAL) ×1 IMPLANT
GAUZE PETRO XEROFOAM 1X8 (MISCELLANEOUS) ×3 IMPLANT
GAUZE SPONGE 4X4 12PLY STRL (GAUZE/BANDAGES/DRESSINGS) ×3 IMPLANT
GLOVE BIO SURGEON STRL SZ7.5 (GLOVE) ×6 IMPLANT
GLOVE BIO SURGEON STRL SZ8 (GLOVE) ×6 IMPLANT
GLOVE BIOGEL PI IND STRL 8 (GLOVE) ×1 IMPLANT
GLOVE BIOGEL PI INDICATOR 8 (GLOVE) ×2
GLOVE INDICATOR 8.0 STRL GRN (GLOVE) ×3 IMPLANT
GOWN STRL REUS W/ TWL LRG LVL3 (GOWN DISPOSABLE) ×1 IMPLANT
GOWN STRL REUS W/ TWL XL LVL3 (GOWN DISPOSABLE) ×1 IMPLANT
GOWN STRL REUS W/TWL LRG LVL3 (GOWN DISPOSABLE) ×3
GOWN STRL REUS W/TWL XL LVL3 (GOWN DISPOSABLE) ×3
GRASPER SUT 15 45D LOW PRO (SUTURE) IMPLANT
IV LACTATED RINGER IRRG 3000ML (IV SOLUTION) ×6
IV LR IRRIG 3000ML ARTHROMATIC (IV SOLUTION) ×2 IMPLANT
MANIFOLD NEPTUNE II (INSTRUMENTS) ×3 IMPLANT
MASK FACE SPIDER DISP (MASK) ×3 IMPLANT
MAT BLUE FLOOR 46X72 FLO (MISCELLANEOUS) ×3 IMPLANT
PACK ARTHROSCOPY SHOULDER (MISCELLANEOUS) ×3 IMPLANT
SLING ARM LRG DEEP (SOFTGOODS) ×3 IMPLANT
SLING ULTRA II LG (MISCELLANEOUS) ×3 IMPLANT
STAPLER SKIN PROX 35W (STAPLE) ×3 IMPLANT
STRAP SAFETY 5IN WIDE (MISCELLANEOUS) ×3 IMPLANT
SUT ETHIBOND 0 MO6 C/R (SUTURE) ×3 IMPLANT
SUT VIC AB 2-0 CT1 27 (SUTURE) ×6
SUT VIC AB 2-0 CT1 TAPERPNT 27 (SUTURE) ×2 IMPLANT
TAPE MICROFOAM 4IN (TAPE) ×3 IMPLANT
TUBING ARTHRO INFLOW-ONLY STRL (TUBING) ×3 IMPLANT
TUBING CONNECTING 10 (TUBING) ×2 IMPLANT
TUBING CONNECTING 10' (TUBING) ×1
WAND HAND CNTRL MULTIVAC 90 (MISCELLANEOUS) ×3 IMPLANT

## 2017-04-25 NOTE — Anesthesia Post-op Follow-up Note (Signed)
Anesthesia QCDR form completed.        

## 2017-04-25 NOTE — Discharge Instructions (Addendum)
Keep dressing dry and intact.  May shower after dressing changed on post-op day #4 (Monday).  Cover staples with Band-Aids after drying off. Apply ice frequently to shoulder. Take ibuprofen 600 mg TID with meals for 7-10 days, then as necessary. Take oxycodone as prescribed when needed.  May supplement with ES Tylenol if necessary. Keep shoulder immobilizer on at all times except may remove for bathing purposes. Follow-up in 10-14 days or as scheduled.     Interscalene Nerve Block with Exparel  1.  For your surgery you have received an Interscalene Nerve Block with Exparel. 2. Nerve Blocks affect many types of nerves, including nerves that control movement, pain and normal sensation.  You may experience feelings such as numbness, tingling, heaviness, weakness or the inability to move your arm or the feeling or sensation that your arm has "fallen asleep". 3. A nerve block with Exparel can last up to 5 days.  Usually the weakness wears off first.  The tingling and heaviness usually wear off next.  Finally you may start to notice pain.  Keep in mind that this may occur in any order.  Once a nerve block starts to wear off it is usually completely gone within 60 minutes. 4. ISNB may cause mild shortness of breath, a hoarse voice, blurry vision, unequal pupils, or drooping of the face on the same side as the nerve block.  These symptoms will usually resolve with the numbness.  Very rarely the procedure itself can cause mild seizures. 5. If needed, your surgeon will give you a prescription for pain medication.  It will take about 60 minutes for the oral pain medication to become fully effective.  So, it is recommended that you start taking this medication before the nerve block first begins to wear off, or when you first begin to feel discomfort. 6. Take your pain medication only as prescribed.  Pain medication can cause sedation and decrease your breathing if you take more than you need for the level of  pain that you have. 7. Nausea is a common side effect of many pain medications.  You may want to eat something before taking your pain medicine to prevent nausea. 8. After an Interscalene nerve block, you cannot feel pain, pressure or extremes in temperature in the effected arm.  Because your arm is numb it is at an increased risk for injury.  To decrease the possibility of injury, please practice the following:  a. While you are awake change the position of your arm frequently to prevent too much pressure on any one area for prolonged periods of time. b.  If you have a cast or tight dressing, check the color or your fingers every couple of hours.  Call your surgeon with the appearance of any discoloration (white or blue). c. If you are given a sling to wear before you go home, please wear it  at all times until the block has completely worn off.  Do not get up at night without your sling. d. Please contact [facility] or your surgeon if you do not begin to regain sensation after 7 days from the surgery.  Anesthesia may be contacted by calling the Same Day Surgery Department, Mon. through Fri., 6 am to 4 pm at [phone].   e. If you experience any other problems or concerns, please contact your surgeon's office. f. If you experience severe or prolonged shortness of breath go to the nearest emergency department.    AMBULATORY SURGERY  DISCHARGE INSTRUCTIONS  1) The drugs that you were given will stay in your system until tomorrow so for the next 24 hours you should not:  A) Drive an automobile B) Make any legal decisions C) Drink any alcoholic beverage   2) You may resume regular meals tomorrow.  Today it is better to start with liquids and gradually work up to solid foods.  You may eat anything you prefer, but it is better to start with liquids, then soup and crackers, and gradually work up to solid foods.   3) Please notify your doctor immediately if you have any unusual  bleeding, trouble breathing, redness and pain at the surgery site, drainage, fever, or pain not relieved by medication.    4) Additional Instructions:        Please contact your physician with any problems or Same Day Surgery at [phone], Monday through Friday 6 am to 4 pm, or [place] at [facility] number at [phone].

## 2017-04-25 NOTE — H&P (Signed)
Paper H&P to be scanned into permanent record. H&P reviewed and patient re-examined. No changes. 

## 2017-04-25 NOTE — Transfer of Care (Signed)
Immediate Anesthesia Transfer of Care Note  Patient: Theresa Booker  Procedure(s) Performed: SHOULDER ARTHROSCOPY WITH OPEN ROTATOR CUFF REPAIR (Right Shoulder)  Patient Location: PACU  Anesthesia Type:GA combined with regional for post-op pain  Level of Consciousness: sedated  Airway & Oxygen Therapy: Patient Spontanous Breathing and Patient connected to face mask oxygen  Post-op Assessment: Report given to RN and Post -op Vital signs reviewed and stable  Post vital signs: Reviewed  Last Vitals:  Vitals:   04/25/17 1333 04/25/17 1542  BP: 113/73 (!) 88/60  Pulse: 74 64  Resp: 15 12  Temp:  (!) 36.2 C  SpO2: 96% 99%    Last Pain:  Vitals:   04/25/17 1249  TempSrc:   PainSc: 4          Complications: No apparent anesthesia complications

## 2017-04-25 NOTE — Anesthesia Procedure Notes (Signed)
Procedure Name: Intubation Date/Time: 04/25/2017 2:23 PM Performed by: Marsh Dolly, CRNA Pre-anesthesia Checklist: Patient identified, Patient being monitored, Timeout performed, Emergency Drugs available and Suction available Patient Re-evaluated:Patient Re-evaluated prior to induction Oxygen Delivery Method: Circle system utilized Preoxygenation: Pre-oxygenation with 100% oxygen Induction Type: IV induction Ventilation: Mask ventilation without difficulty Laryngoscope Size: 3 and Miller Grade View: Grade I Tube type: Oral Tube size: 7.5 mm Number of attempts: 1 Placement Confirmation: ETT inserted through vocal cords under direct vision,  positive ETCO2 and breath sounds checked- equal and bilateral Secured at: 21 cm Tube secured with: Tape Dental Injury: Teeth and Oropharynx as per pre-operative assessment

## 2017-04-25 NOTE — OR Nursing (Signed)
Patient reports taking 22 units of Lantus this am.  FSBS 53 upon arrival patient not symptomatic.  Dr Lavone Neri notified and orders received.  Dr Lavone Neri in to see patient.

## 2017-04-25 NOTE — Anesthesia Preprocedure Evaluation (Addendum)
Anesthesia Evaluation  Patient identified by MRN, date of birth, ID band Patient awake    Reviewed: Allergy & Precautions, H&P , NPO status , reviewed documented beta blocker date and time   Airway Mallampati: II  TM Distance: >3 FB     Dental  (+) Upper Dentures, Lower Dentures   Pulmonary asthma ,    Pulmonary exam normal        Cardiovascular hypertension, Normal cardiovascular exam     Neuro/Psych  Headaches, PSYCHIATRIC DISORDERS Depression  Neuromuscular disease    GI/Hepatic GERD  ,  Endo/Other  diabetes  Renal/GU CRFRenal disease     Musculoskeletal  (+) Fibromyalgia -  Abdominal   Peds  Hematology   Anesthesia Other Findings   Reproductive/Obstetrics                             Anesthesia Physical Anesthesia Plan  ASA: III  Anesthesia Plan: Regional and General   Post-op Pain Management:    Induction:   PONV Risk Score and Plan: 3 and Ondansetron and Midazolam  Airway Management Planned:   Additional Equipment:   Intra-op Plan:   Post-operative Plan:   Informed Consent: I have reviewed the patients History and Physical, chart, labs and discussed the procedure including the risks, benefits and alternatives for the proposed anesthesia with the patient or authorized representative who has indicated his/her understanding and acceptance.   Dental Advisory Given  Plan Discussed with: CRNA  Anesthesia Plan Comments:         Anesthesia Quick Evaluation

## 2017-04-25 NOTE — Op Note (Signed)
04/25/2017  3:36 PM  Patient:   Theresa Booker  Pre-Op Diagnosis:   Impingement/tendinopathy with rotator cuff tear, right shoulder.  Post-Op Diagnosis: Impingement/tendinopathy with rotator cuff tear, labral fraying, and biceps tendinopathy, right shoulder.  Procedure: Limited arthroscopic debridement, arthroscopic subacromial decompression, mini-open rotator cuff repair, and mini-open biceps tenodesis, right shoulder.  Anesthesia: General endotracheal with interscalene block placed preoperatively by the anesthesiologist.  Surgeon:   Pascal Lux, MD  Assistant:   None  Findings: As above. There was mild labral fraying anteriorly and superiorly without frank detachment from the glenoid. There was a full-thickness tear involving the entire insertion of the supraspinatus tendon with approximate 1 cm of retraction. The remainder of the rotator cuff was in satisfactory condition. There were significant tendinopathic changes of the biceps tendon with partial tearing noted. The articular surfaces of the glenoid and humerus both were in excellent condition.  Complications: None  Fluids:   500 cc  Estimated blood loss: 5 cc  Tourniquet time: None  Drains: None  Closure: Staples   Brief clinical note: The patient is a 72 year old female with a history of right shoulder pain. The patient's symptoms have progressed despite medications, activity modification, etc. The patient's history and examination are consistent with impingement/tendinopathy with a rotator cuff tear. These findings were confirmed by MRI scan. The patient presents at this time for definitive management of these shoulder symptoms.  Procedure: The patient underwent placement of an interscalene block by the anesthesiologist in the preoperative holding area before being brought into the operating room and lain in the supine position. The patient then underwent general endotracheal intubation and  anesthesia before being repositioned in the beach chair position using the beach chair positioner. The right shoulder and upper extremity were prepped with ChloraPrep solution before being draped sterilely. Preoperative antibiotics were administered. A timeout was performed to confirm the proper surgical site before the expected portal sites and incision site were injected with 0.5% Sensorcaine with epinephrine. A posterior portal was created and the glenohumeral joint thoroughly inspected with the findings as described above. An anterior portal was created using an outside-in technique. The labrum and rotator cuff were further probed, again confirming the above-noted findings. The areas of labral fraying and synovitis were debrided back to stable margins using the full-radius resector. The ArthroCare wand was inserted and used to release the biceps tendon from its labral attachment. It also was used to obtain hemostasis as well as to "anneal" the labrum superiorly and anteriorly. The instruments were removed from the joint after suctioning the excess fluid.  The camera was repositioned through the posterior portal into the subacromial space. A separate lateral portal was created using an outside-in technique. The 3.5 mm full-radius resector was introduced and used to perform a subtotal bursectomy. The ArthroCare wand was then inserted and used to remove the periosteal tissue off the undersurface of the anterior third of the acromion as well as to recess the coracoacromial ligament from its attachment along the anterior and lateral margins of the acromion. The 4.0 mm acromionizing bur was introduced and used to complete the decompression by removing the undersurface of the anterior third of the acromion. The full radius resector was reintroduced to remove any residual bony debris before the ArthroCare wand was reintroduced to obtain hemostasis. The instruments were then removed from the subacromial space after  suctioning the excess fluid.  An approximately 4-5 cm incision was made over the anterolateral aspect of the shoulder beginning at the anterolateral corner  of the acromion and extending distally in line with the bicipital groove. This incision was carried down through the subcutaneous tissues to expose the deltoid fascia. The raphae between the anterior and middle thirds was identified and this plane developed to provide access into the subacromial space. Additional bursal tissues were debrided sharply using Metzenbaum scissors. The rotator cuff tear was readily identified. The margins were debrided sharply with a #15 blade and the exposed greater tuberosity roughened with a rongeur. The tear was repaired using two Biomet 2.9 mm JuggerKnot anchors. These sutures were then brought back laterally and secured using two [place] QuatroLink anchors to create a two-layer closure. An apparent watertight closure was obtained.  The bicipital groove was identified by palpation and opened for 1-1.5 cm. The biceps tendon stump was retrieved through this defect. The floor of the bicipital groove was roughened with a curet before another Biomet 2.9 mm JuggerKnot anchor was inserted. Both sets of sutures were passed through the biceps tendon and tied securely to effect the tenodesis. The bicipital sheath was reapproximated using two #0 Ethibond interrupted sutures, incorporating the biceps tendon to further reinforce the tenodesis.  The wound was copiously irrigated with sterile saline solution before the deltoid raphae was reapproximated using 2-0 Vicryl interrupted sutures. The subcutaneous tissues were closed in two layers using 2-0 Vicryl interrupted sutures before the skin was closed using staples. The portal sites also were closed using staples. A sterile bulky dressing was applied to the shoulder before the arm was placed into a shoulder immobilizer. The patient was then awakened, extubated, and returned to the  recovery room in satisfactory condition after tolerating the procedure well.

## 2017-04-25 NOTE — Anesthesia Procedure Notes (Signed)
Anesthesia Regional Block: Interscalene brachial plexus block   Pre-Anesthetic Checklist: ,, timeout performed, Correct Patient, Correct Site, Correct Laterality, Correct Procedure, Correct Position, site marked, Risks and benefits discussed,  Surgical consent,  Pre-op evaluation,  At surgeon's request and post-op pain management  Laterality: Right  Prep: chloraprep       Needles:  Injection technique: Single-shot  Needle Type: Echogenic Stimulator Needle     Needle Length: 10cm  Needle Gauge: 20     Additional Needles:   Procedures: Doppler guided,,,, ultrasound used (permanent image in chart),,,,  Motor weakness within 4 minutes.  Narrative:  Start time: 04/25/2017 1:00 PM End time: 04/25/2017 1:06 PM Injection made incrementally with aspirations every 5 mL.  Performed by: Personally  Anesthesiologist: Alphonsus Sias, MD  Additional Notes: O2/Mons/PACU w Versed 1mg  & Fentanyl 50 mcg/ Tol well

## 2017-04-25 NOTE — Anesthesia Postprocedure Evaluation (Signed)
Anesthesia Post Note  Patient: Theresa Booker  Procedure(s) Performed: SHOULDER ARTHROSCOPY WITH OPEN ROTATOR CUFF REPAIR (Right Shoulder)  Patient location during evaluation: PACU Anesthesia Type: Regional and General Level of consciousness: awake and alert and oriented Pain management: pain level controlled Vital Signs Assessment: post-procedure vital signs reviewed and stable Respiratory status: spontaneous breathing Cardiovascular status: blood pressure returned to baseline Anesthetic complications: no     Last Vitals:  Vitals:   04/25/17 1733 04/25/17 1755  BP: 138/72 120/73  Pulse: 71 73  Resp: 18 18  Temp:  (!) 36.3 C  SpO2: 95% 95%    Last Pain:  Vitals:   04/25/17 1755  TempSrc: Temporal  PainSc:                  Tavarius Grewe

## 2017-04-26 ENCOUNTER — Encounter: Payer: Self-pay | Admitting: Surgery
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
## 2017-09-30 ENCOUNTER — Emergency Department: Payer: Medicare Other

## 2017-09-30 ENCOUNTER — Encounter: Payer: Self-pay | Admitting: Intensive Care

## 2017-09-30 ENCOUNTER — Emergency Department
Admission: EM | Admit: 2017-09-30 | Discharge: 2017-09-30 | Disposition: A | Discharge status: Home / Self Care | Payer: Medicare Other | Attending: Student in an Organized Health Care Education/Training Program | Admitting: Student in an Organized Health Care Education/Training Program

## 2017-09-30 ENCOUNTER — Other Ambulatory Visit: Payer: Self-pay

## 2017-09-30 DIAGNOSIS — N183 Chronic kidney disease, stage 3 (moderate): Secondary | ICD-10-CM | POA: Diagnosis not present

## 2017-09-30 DIAGNOSIS — J45909 Unspecified asthma, uncomplicated: Secondary | ICD-10-CM | POA: Diagnosis not present

## 2017-09-30 DIAGNOSIS — E119 Type 2 diabetes mellitus without complications: Secondary | ICD-10-CM | POA: Diagnosis not present

## 2017-09-30 DIAGNOSIS — R252 Cramp and spasm: Secondary | ICD-10-CM | POA: Diagnosis present

## 2017-09-30 DIAGNOSIS — Z79899 Other long term (current) drug therapy: Secondary | ICD-10-CM | POA: Insufficient documentation

## 2017-09-30 DIAGNOSIS — R2243 Localized swelling, mass and lump, lower limb, bilateral: Secondary | ICD-10-CM | POA: Insufficient documentation

## 2017-09-30 DIAGNOSIS — I129 Hypertensive chronic kidney disease with stage 1 through stage 4 chronic kidney disease, or unspecified chronic kidney disease: Secondary | ICD-10-CM | POA: Diagnosis not present

## 2017-09-30 LAB — URINALYSIS, COMPLETE (UACMP) WITH MICROSCOPIC
Bacteria, UA: NONE SEEN
Bilirubin Urine: NEGATIVE
Glucose, UA: NEGATIVE mg/dL
Hgb urine dipstick: NEGATIVE
KETONES UR: NEGATIVE mg/dL
Leukocytes, UA: NEGATIVE
Nitrite: NEGATIVE
PH: 5 (ref 5.0–8.0)
PROTEIN: NEGATIVE mg/dL
Specific Gravity, Urine: 1.015 (ref 1.005–1.030)

## 2017-09-30 LAB — CBC WITH DIFFERENTIAL/PLATELET
Basophils Absolute: 0.1 10*3/uL (ref 0–0.1)
Basophils Relative: 1 %
Eosinophils Absolute: 0.2 10*3/uL (ref 0–0.7)
Eosinophils Relative: 2 %
HEMATOCRIT: 36 % (ref 35.0–47.0)
HEMOGLOBIN: 12.3 g/dL (ref 12.0–16.0)
LYMPHS ABS: 3.2 10*3/uL (ref 1.0–3.6)
LYMPHS PCT: 36 %
MCH: 29.1 pg (ref 26.0–34.0)
MCHC: 34 g/dL (ref 32.0–36.0)
MCV: 85.5 fL (ref 80.0–100.0)
MONOS PCT: 10 %
Monocytes Absolute: 0.9 10*3/uL (ref 0.2–0.9)
NEUTROS PCT: 51 %
Neutro Abs: 4.5 10*3/uL (ref 1.4–6.5)
Platelets: 332 10*3/uL (ref 150–440)
RBC: 4.21 MIL/uL (ref 3.80–5.20)
RDW: 14.2 % (ref 11.5–14.5)
WBC: 8.8 10*3/uL (ref 3.6–11.0)

## 2017-09-30 LAB — COMPREHENSIVE METABOLIC PANEL
ALK PHOS: 52 U/L (ref 38–126)
ALT: 16 U/L (ref 0–44)
AST: 21 U/L (ref 15–41)
Albumin: 3.8 g/dL (ref 3.5–5.0)
Anion gap: 8 (ref 5–15)
BILIRUBIN TOTAL: 0.4 mg/dL (ref 0.3–1.2)
BUN: 34 mg/dL — ABNORMAL HIGH (ref 8–23)
CALCIUM: 9.5 mg/dL (ref 8.9–10.3)
CO2: 28 mmol/L (ref 22–32)
CREATININE: 2 mg/dL — AB (ref 0.44–1.00)
Chloride: 102 mmol/L (ref 98–111)
GFR, EST AFRICAN AMERICAN: 27 mL/min — AB (ref >60)
GFR, EST NON AFRICAN AMERICAN: 23 mL/min — AB (ref >60)
Glucose, Bld: 119 mg/dL — ABNORMAL HIGH (ref 70–99)
Potassium: 4.1 mmol/L (ref 3.5–5.1)
SODIUM: 138 mmol/L (ref 135–145)
TOTAL PROTEIN: 7.7 g/dL (ref 6.5–8.1)

## 2017-09-30 LAB — SEDIMENTATION RATE: Sed Rate: 21 mm/hr (ref 0–30)

## 2017-09-30 LAB — CK: Total CK: 245 U/L — ABNORMAL HIGH (ref 38–234)

## 2017-09-30 MED ORDER — ACETAMINOPHEN 500 MG PO TABS
1000.0000 mg | ORAL_TABLET | Freq: Once | ORAL | Status: AC
  Administered 2017-09-30: 1000 mg via ORAL
  Filled 2017-09-30: qty 2

## 2017-09-30 MED ORDER — CYCLOBENZAPRINE HCL 5 MG PO TABS: 5.0000 mg | ORAL_TABLET | Freq: Two times a day (BID) | ORAL | 0 refills | Status: DC | PRN

## 2017-09-30 MED ORDER — DICLOFENAC SODIUM 1 % TD GEL: 2.0000 g | Freq: Two times a day (BID) | TRANSDERMAL | 0 refills | Status: DC | PRN

## 2017-09-30 MED ORDER — SODIUM CHLORIDE 0.9 % IV BOLUS
500.0000 mL | Freq: Once | INTRAVENOUS | Status: AC
  Administered 2017-09-30: 500 mL via INTRAVENOUS

## 2017-09-30 MED ORDER — DIPHENHYDRAMINE HCL 50 MG/ML IJ SOLN
12.5000 mg | Freq: Once | INTRAMUSCULAR | Status: AC
  Administered 2017-09-30: 12.5 mg via INTRAVENOUS
  Filled 2017-09-30: qty 1

## 2017-09-30 NOTE — Discharge Instructions (Addendum)
I recommend you discuss with your primary care provider possible changes to your medications that can be causing these muscle cramps and muscle aches.  Particularly your fenofibric acid has been associated with this as well as Aricept.  Return for any worsening symptoms or for any other questions or concerns.

## 2017-09-30 NOTE — ED Triage Notes (Signed)
PAtient arrived by EMS from home for extreme leg cramps. Able to ambulate with EMS with pain in legs. NKDA. HX diabetes, hyperlipidemia, and renal disorders. EMS vitals 125/67, p-73, 98% RA. Pt A&O x4 upon arrival

## 2017-09-30 NOTE — ED Notes (Signed)
Pt given graham crackers and peanut butter with gingerale

## 2017-09-30 NOTE — ED Provider Notes (Signed)
[facility] Emergency Department Provider Note    First MD Initiated Contact with Patient 09/30/17 0745     (approximate)  I have reviewed the triage vital signs and the nursing notes.   HISTORY  Chief Complaint No chief complaint on file.    HPI Theresa Booker is a 72 y.o. female with a history of CKD stage III as well as fibromyalgia hypertension hyperlipidemia presents the ER with chief complaint of bilateral leg cramps that started this morning at 6 AM.  States she is also feeling cramping in her back.  Has had episodes of this in the past previously related to being on atorvastatin.  Denies any fevers.  No chest pain or shortness of breath.  Denies any nausea or vomiting.  No numbness or tingling.  She does not smoke.    Past Medical History:  Diagnosis Date  . Asthma   . Atypical chest pain    neg ETT at [facility]  . CKD (chronic kidney disease), stage III ([place])   . Depression   . Diabetes mellitus without complication ([place])   . Fibromyalgia   . HLD (hyperlipidemia)   . Hypertension   . Vitamin D deficiency    Family History  Problem Relation Age of Onset  . Diabetes Mother   . Breast cancer Mother 30  . Cancer Sister   . Cancer Father   . Liver disease Brother   . Breast cancer Maternal Aunt   . Breast cancer Cousin        maternal   Past Surgical History:  Procedure Laterality Date  . ABDOMINAL HYSTERECTOMY    . COLONOSCOPY WITH PROPOFOL N/A 05/22/2016   Procedure: COLONOSCOPY WITH PROPOFOL;  Surgeon: Lollie Sails, MD;  Location: [facility] ENDOSCOPY;  Service: Endoscopy;  Laterality: N/A;  . ESOPHAGOGASTRODUODENOSCOPY (EGD) WITH PROPOFOL N/A 05/22/2016   Procedure: ESOPHAGOGASTRODUODENOSCOPY (EGD) WITH PROPOFOL;  Surgeon: Lollie Sails, MD;  Location: [facility] ENDOSCOPY;  Service: Endoscopy;  Laterality: N/A;  . HAND SURGERY Right 2014   Dr Rudene Christians  . rotator cuff surgery Left 03/2013   DR Rudene Christians  . SHOULDER ARTHROSCOPY WITH OPEN ROTATOR  CUFF REPAIR Right 04/25/2017   Procedure: SHOULDER ARTHROSCOPY WITH OPEN ROTATOR CUFF REPAIR;  Surgeon: Corky Mull, MD;  Location: [facility] ORS;  Service: Orthopedics;  Laterality: Right;   Patient Active Problem List   Diagnosis Date Noted  . Cocaine use 05/03/2015  . Abnormal MRI, lumbar spine 05/03/2015  . Numbness in feet 04/29/2015  . Gait instability 04/29/2015  . BP (high blood pressure) 04/13/2015  . Fibromyalgia 04/13/2015  . Essential (primary) hypertension 04/13/2015  . Clinical depression 04/13/2015  . Chronic kidney disease (CKD), stage III (moderate) ([place]) 04/13/2015  . Airway hyperreactivity 04/13/2015  . Chronic knee pain (Right) 04/13/2015  . Chronic low back pain (Location of Secondary source of pain) (Bilateral) (R>L) 04/13/2015  . Chronic lower straight pain (Location of Primary Source of Pain) (Right) 04/13/2015  . Chronic lumbar radicular pain (Right) (L5 Dermatome) 04/13/2015  . At high risk for falls 04/13/2015  . Chronic hip pain (Right) 04/13/2015  . Chronic sacroiliac joint pain (Bilateral) (R>L) 04/13/2015  . Controlled type 2 diabetes mellitus without complication ([place]) 69/79/4801  . Headache disorder 02/01/2015  . Dizziness 02/01/2015  . Bad posture 02/01/2015  . Episodic tension type headache 01/10/2015  . Amnesia 10/18/2014  . 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 Acid reflux 09/24/2013  . Chronic pain 09/24/2013  . Avitaminosis  D 02/11/2013      Prior to Admission medications   Medication Sig Start Date End Date Taking? Authorizing Provider  acetaminophen (TYLENOL) 500 MG tablet Take 500-1,000 mg by mouth every 6 (six) hours as needed for mild pain or moderate pain.    Yes [provider]  albuterol (PROAIR HFA) 108 (90 BASE) MCG/ACT inhaler Inhale 2 puffs into the lungs every 6 (six) hours as needed for wheezing or shortness of breath.    Yes [provider]  cholecalciferol (VITAMIN D) 1000 units  tablet Take 1,000 Units by mouth daily.   Yes [provider]  Choline Fenofibrate (FENOFIBRIC ACID) 45 MG CPDR Take 45 mg by mouth at bedtime.   Yes [provider]  donepezil (ARICEPT) 10 MG tablet Take 10 mg by mouth at bedtime.   Yes [provider]  hydrochlorothiazide (MICROZIDE) 12.5 MG capsule Take 12.5 mg by mouth daily.   Yes [provider]  insulin glargine (LANTUS) 100 UNIT/ML injection Inject 43 Units into the skin at bedtime.    Yes [provider]  isosorbide mononitrate (IMDUR) 30 MG 24 hr tablet Take 30 mg by mouth daily.   Yes [provider]  loratadine (CLARITIN) 10 MG tablet Take 10 mg by mouth daily.   Yes [provider]  losartan (COZAAR) 25 MG tablet Take 25 mg by mouth daily.   Yes [provider]  nortriptyline (PAMELOR) 10 MG capsule Take 30 mg by mouth at bedtime.    Yes [provider]  omeprazole (PRILOSEC) 40 MG capsule Take 40 mg by mouth daily.   Yes [provider]  sitaGLIPtin (JANUVIA) 100 MG tablet Take 100 mg by mouth daily.   Yes [provider]  SUMAtriptan (IMITREX) 100 MG tablet Take 100 mg by mouth every 2 (two) hours as needed for migraine. May repeat in 2 hours if headache persists or recurs.   Yes [provider]  venlafaxine XR (EFFEXOR-XR) 75 MG 24 hr capsule Take 75 mg by mouth daily with breakfast.   Yes [provider]  cyclobenzaprine (FLEXERIL) 5 MG tablet Take 1 tablet (5 mg total) by mouth 3 times/day as needed-between meals & bedtime for muscle spasms. 09/30/17   Merlyn Lot, MD  diclofenac sodium (VOLTAREN) 1 % GEL Apply 2 g topically 2 (two) times daily as needed. 09/30/17   Merlyn Lot, MD    Allergies Ace inhibitors; Pollen extracts [pollen extract]; and Vicodin [hydrocodone-acetaminophen]    Social History Social History   Tobacco Use  . Smoking status: Never Smoker  . Smokeless tobacco: Never Used    Substance Use Topics  . Alcohol use: No    Alcohol/week: 0.0 oz  . Drug use: No    Review of Systems Patient denies headaches, rhinorrhea, blurry vision, numbness, shortness of breath, chest pain, edema, cough, abdominal pain, nausea, vomiting, diarrhea, dysuria, fevers, rashes or hallucinations unless otherwise stated above in HPI. ____________________________________________   PHYSICAL EXAM:  VITAL SIGNS: Vitals:   09/30/17 1100 09/30/17 1130  BP: 114/74 110/71  Pulse: 66 61  Resp: 17 19  Temp:    SpO2: 96% 95%    Constitutional: Alert and oriented.  Eyes: Conjunctivae are normal.  Head: Atraumatic. Nose: No congestion/rhinnorhea. Mouth/Throat: Mucous membranes are moist.   Neck: No stridor. Painless ROM.  Cardiovascular: Normal rate, regular rhythm. Grossly normal heart sounds.  Good peripheral circulation. Respiratory: Normal respiratory effort.  No retractions. Lungs CTAB. Gastrointestinal: Soft and nontender. No distention. No abdominal  bruits. No CVA tenderness. Genitourinary:  Musculoskeletal: No lower extremity tenderness nor edema. Well perfused, No joint effusions. Neurologic:  Normal speech and language. No gross focal neurologic deficits are appreciated. No facial droop Skin:  Skin is warm, dry and intact. No rash noted. Psychiatric: Mood and affect are normal. Speech and behavior are normal.  ____________________________________________   LABS (all labs ordered are listed, but only abnormal results are displayed)  Results for orders placed or performed during the hospital encounter of 09/30/17 (from the past 24 hour(s))  CBC with Differential/Platelet     Status: None   Collection Time: 09/30/17  7:51 AM  Result Value Ref Range   WBC 8.8 3.6 - 11.0 K/uL   RBC 4.21 3.80 - 5.20 MIL/uL   Hemoglobin 12.3 12.0 - 16.0 g/dL   HCT 36.0 35.0 - 47.0 %   MCV 85.5 80.0 - 100.0 fL   MCH 29.1 26.0 - 34.0 pg   MCHC 34.0 32.0 - 36.0 g/dL   RDW 14.2 11.5 - 14.5 %    Platelets 332 150 - 440 K/uL   Neutrophils Relative % 51 %   Neutro Abs 4.5 1.4 - 6.5 K/uL   Lymphocytes Relative 36 %   Lymphs Abs 3.2 1.0 - 3.6 K/uL   Monocytes Relative 10 %   Monocytes Absolute 0.9 0.2 - 0.9 K/uL   Eosinophils Relative 2 %   Eosinophils Absolute 0.2 0 - 0.7 K/uL   Basophils Relative 1 %   Basophils Absolute 0.1 0 - 0.1 K/uL  Comprehensive metabolic panel     Status: Abnormal   Collection Time: 09/30/17  7:51 AM  Result Value Ref Range   Sodium 138 135 - 145 mmol/L   Potassium 4.1 3.5 - 5.1 mmol/L   Chloride 102 98 - 111 mmol/L   CO2 28 22 - 32 mmol/L   Glucose, Bld 119 (H) 70 - 99 mg/dL   BUN 34 (H) 8 - 23 mg/dL   Creatinine, Ser 2.00 (H) 0.44 - 1.00 mg/dL   Calcium 9.5 8.9 - 10.3 mg/dL   Total Protein 7.7 6.5 - 8.1 g/dL   Albumin 3.8 3.5 - 5.0 g/dL   AST 21 15 - 41 U/L   ALT 16 0 - 44 U/L   Alkaline Phosphatase 52 38 - 126 U/L   Total Bilirubin 0.4 0.3 - 1.2 mg/dL   GFR calc non Af Amer 23 (L) >60 mL/min   GFR calc Af Amer 27 (L) >60 mL/min   Anion gap 8 5 - 15  CK     Status: Abnormal   Collection Time: 09/30/17  7:51 AM  Result Value Ref Range   Total CK 245 (H) 38 - 234 U/L  Sedimentation rate     Status: None   Collection Time: 09/30/17  7:51 AM  Result Value Ref Range   Sed Rate 21 0 - 30 mm/hr  Urinalysis, Complete w Microscopic     Status: Abnormal   Collection Time: 09/30/17  9:41 AM  Result Value Ref Range   Color, Urine YELLOW (A) YELLOW   APPearance CLEAR (A) CLEAR   Specific Gravity, Urine 1.015 1.005 - 1.030   pH 5.0 5.0 - 8.0   Glucose, UA NEGATIVE NEGATIVE mg/dL   Hgb urine dipstick NEGATIVE NEGATIVE   Bilirubin Urine NEGATIVE NEGATIVE   Ketones, ur NEGATIVE NEGATIVE mg/dL   Protein, ur NEGATIVE NEGATIVE mg/dL   Nitrite NEGATIVE NEGATIVE   Leukocytes, UA NEGATIVE NEGATIVE   RBC / HPF 0-5  0 - 5 RBC/hpf   WBC, UA 0-5 0 - 5 WBC/hpf   Bacteria, UA NONE SEEN NONE SEEN   Squamous Epithelial / LPF 0-5 0 - 5   Mucus PRESENT     Hyaline Casts, UA PRESENT    ____________________________________________  EKG My review and personal interpretation at Time:  7:50  Indication: cramps  Rate: 75  Rhythm: sinus Axis: normal Other: normal intervals, no stemi ____________________________________________  RADIOLOGY  I personally reviewed all radiographic images ordered to evaluate for the above acute complaints and reviewed radiology reports and findings.  These findings were personally discussed with the patient.  Please see medical record for radiology report.  ____________________________________________   PROCEDURES  Procedure(s) performed:  Procedures    Critical Care performed: o ____________________________________________   INITIAL IMPRESSION / ASSESSMENT AND PLAN / ED COURSE  Pertinent labs & imaging results that were available during my care of the patient were reviewed by me and considered in my medical decision making (see chart for details).   DDX: dehydration, electrolyte abn, RLS, myositis  RACINE ERBY is a 72 y.o. who presents to the ED with symptoms as described above.  Patient afebrile hemodynamically stable.  Does have multiple medical comorbidities.  Will check blood work for the above differential.  Will order ultrasound to evaluate for DVT.  Does not seem clinically consistent with infectious process.  Clinical Course as of Oct 01 1407  Mon Sep 30, 2017  0819 Renal function is roughly at her baseline.   [PR]  1014 CK is mildly elevated.  She does not have any hematuria or protein loss on her urine to suggest rhabdo or acute renal injury.  Does appear she still taking the fenofibric acid which is also known to cause myopathy.  No evidence of DVT.  Lower extremities well-perfused.   [PR]  1057 ESR nml suggesting against PMR.   [PR]    Clinical Course User Index [PR] Merlyn Lot, MD     As part of my medical decision making, I reviewed the following data within the [place] notes reviewed and incorporated, Labs reviewed, notes from prior ED visits and [place] Controlled Substance Database   ____________________________________________   FINAL CLINICAL IMPRESSION(S) / ED DIAGNOSES  Final diagnoses:  Muscle cramps      NEW MEDICATIONS STARTED DURING THIS VISIT:  Discharge Medication List as of 09/30/2017 11:46 AM    START taking these medications   Details  diclofenac sodium (VOLTAREN) 1 % GEL Apply 2 g topically 2 (two) times daily as needed., Starting Mon 09/30/2017, Normal         Note:  This document was prepared using Dragon voice recognition software and may include unintentional dictation errors.    Merlyn Lot, MD 09/30/17 1409

## 2017-09-30 NOTE — ED Notes (Signed)

## 2017-11-29 ENCOUNTER — Other Ambulatory Visit: Payer: Self-pay | Admitting: Surgery

## 2017-11-29 DIAGNOSIS — S46011D Strain of muscle(s) and tendon(s) of the rotator cuff of right shoulder, subsequent encounter: Secondary | ICD-10-CM

## 2017-11-29 DIAGNOSIS — M7521 Bicipital tendinitis, right shoulder: Secondary | ICD-10-CM

## 2018-01-09 ENCOUNTER — Ambulatory Visit
Admission: RE | Admit: 2018-01-09 | Discharge: 2018-01-09 | Disposition: A | Discharge status: Home / Self Care | Payer: Medicare Other | Source: Ambulatory Visit | Attending: Surgery | Admitting: Surgery

## 2018-01-09 DIAGNOSIS — S46011D Strain of muscle(s) and tendon(s) of the rotator cuff of right shoulder, subsequent encounter: Secondary | ICD-10-CM | POA: Diagnosis present

## 2018-01-09 DIAGNOSIS — X58XXXD Exposure to other specified factors, subsequent encounter: Secondary | ICD-10-CM | POA: Insufficient documentation

## 2018-01-09 DIAGNOSIS — M19011 Primary osteoarthritis, right shoulder: Secondary | ICD-10-CM | POA: Diagnosis not present

## 2018-01-09 DIAGNOSIS — M7521 Bicipital tendinitis, right shoulder: Secondary | ICD-10-CM | POA: Insufficient documentation

## 2018-01-09 DIAGNOSIS — M2540 Effusion, unspecified joint: Secondary | ICD-10-CM | POA: Insufficient documentation

## 2018-01-09 DIAGNOSIS — M7581 Other shoulder lesions, right shoulder: Secondary | ICD-10-CM | POA: Diagnosis present

## 2018-02-28 ENCOUNTER — Inpatient Hospital Stay: Admission: RE | Admit: 2018-02-28 | Payer: Medicare Other | Source: Ambulatory Visit

## 2018-03-07 ENCOUNTER — Inpatient Hospital Stay
Admission: RE | Admit: 2018-03-07 | Discharge: 2018-03-07 | Disposition: A | Discharge status: Home / Self Care | Payer: Medicare Other | Source: Ambulatory Visit

## 2018-03-07 NOTE — Pre-Procedure Instructions (Signed)
Left message with Jonelle Sidle that the patient did not show up for her PAT appointment today.

## 2018-03-10 MED ORDER — CEFAZOLIN SODIUM-DEXTROSE 2-4 GM/100ML-% IV SOLN
2.0000 g | Freq: Once | INTRAVENOUS | Status: AC
  Administered 2018-03-11: 2 g via INTRAVENOUS

## 2018-03-11 ENCOUNTER — Other Ambulatory Visit: Payer: Self-pay

## 2018-03-11 ENCOUNTER — Ambulatory Visit
Admission: RE | Admit: 2018-03-11 | Discharge: 2018-03-11 | Disposition: A | Discharge status: Home / Self Care | Payer: Medicare Other | Attending: Surgery | Admitting: Surgery

## 2018-03-11 ENCOUNTER — Ambulatory Visit: Payer: Medicare Other | Admitting: Certified Registered Nurse Anesthetist

## 2018-03-11 ENCOUNTER — Encounter: Payer: Self-pay | Admitting: *Deleted

## 2018-03-11 ENCOUNTER — Encounter
Admission: RE | Disposition: A | Discharge status: Home / Self Care | Payer: Self-pay | Source: Home / Self Care | Attending: Surgery

## 2018-03-11 DIAGNOSIS — E1122 Type 2 diabetes mellitus with diabetic chronic kidney disease: Secondary | ICD-10-CM | POA: Insufficient documentation

## 2018-03-11 DIAGNOSIS — I129 Hypertensive chronic kidney disease with stage 1 through stage 4 chronic kidney disease, or unspecified chronic kidney disease: Secondary | ICD-10-CM | POA: Diagnosis not present

## 2018-03-11 DIAGNOSIS — N183 Chronic kidney disease, stage 3 (moderate): Secondary | ICD-10-CM | POA: Insufficient documentation

## 2018-03-11 DIAGNOSIS — Z7982 Long term (current) use of aspirin: Secondary | ICD-10-CM | POA: Insufficient documentation

## 2018-03-11 DIAGNOSIS — S46011A Strain of muscle(s) and tendon(s) of the rotator cuff of right shoulder, initial encounter: Secondary | ICD-10-CM | POA: Diagnosis present

## 2018-03-11 DIAGNOSIS — X58XXXA Exposure to other specified factors, initial encounter: Secondary | ICD-10-CM | POA: Insufficient documentation

## 2018-03-11 DIAGNOSIS — Z79899 Other long term (current) drug therapy: Secondary | ICD-10-CM | POA: Diagnosis not present

## 2018-03-11 HISTORY — PX: SHOULDER ARTHROSCOPY WITH OPEN ROTATOR CUFF REPAIR: SHX6092

## 2018-03-11 LAB — URINE DRUG SCREEN, QUALITATIVE (ARMC ONLY)
Amphetamines, Ur Screen: NOT DETECTED
Barbiturates, Ur Screen: NOT DETECTED
Benzodiazepine, Ur Scrn: NOT DETECTED
Cannabinoid 50 Ng, Ur ~~LOC~~: NOT DETECTED
Cocaine Metabolite,Ur ~~LOC~~: NOT DETECTED
MDMA (ECSTASY) UR SCREEN: NOT DETECTED
Methadone Scn, Ur: NOT DETECTED
Opiate, Ur Screen: NOT DETECTED
Phencyclidine (PCP) Ur S: NOT DETECTED
Tricyclic, Ur Screen: POSITIVE — AB

## 2018-03-11 LAB — BASIC METABOLIC PANEL
Anion gap: 5 (ref 5–15)
BUN: 24 mg/dL — ABNORMAL HIGH (ref 8–23)
CO2: 27 mmol/L (ref 22–32)
Calcium: 8.9 mg/dL (ref 8.9–10.3)
Chloride: 107 mmol/L (ref 98–111)
Creatinine, Ser: 1.64 mg/dL — ABNORMAL HIGH (ref 0.44–1.00)
GFR calc Af Amer: 35 mL/min — ABNORMAL LOW (ref >60)
GFR, EST NON AFRICAN AMERICAN: 30 mL/min — AB (ref >60)
Glucose, Bld: 147 mg/dL — ABNORMAL HIGH (ref 70–99)
POTASSIUM: 4.2 mmol/L (ref 3.5–5.1)
Sodium: 139 mmol/L (ref 135–145)

## 2018-03-11 LAB — CBC
HCT: 41.2 % (ref 36.0–46.0)
Hemoglobin: 13.3 g/dL (ref 12.0–15.0)
MCH: 28.2 pg (ref 26.0–34.0)
MCHC: 32.3 g/dL (ref 30.0–36.0)
MCV: 87.5 fL (ref 80.0–100.0)
NRBC: 0 % (ref 0.0–0.2)
Platelets: 304 10*3/uL (ref 150–400)
RBC: 4.71 MIL/uL (ref 3.87–5.11)
RDW: 14.2 % (ref 11.5–15.5)
WBC: 9.5 10*3/uL (ref 4.0–10.5)

## 2018-03-11 LAB — GLUCOSE, CAPILLARY
Glucose-Capillary: 134 mg/dL — ABNORMAL HIGH (ref 70–99)
Glucose-Capillary: 164 mg/dL — ABNORMAL HIGH (ref 70–99)

## 2018-03-11 SURGERY — ARTHROSCOPY, SHOULDER WITH REPAIR, ROTATOR CUFF, OPEN
Anesthesia: General | Site: Shoulder | Laterality: Right

## 2018-03-11 MED ORDER — PHENYLEPHRINE HCL 10 MG/ML IJ SOLN
INTRAMUSCULAR | Status: AC
  Filled 2018-03-11: qty 1

## 2018-03-11 MED ORDER — ONDANSETRON 4 MG PO TBDP: 4.0000 mg | ORAL_TABLET | Freq: Three times a day (TID) | ORAL | 1 refills | Status: DC | PRN

## 2018-03-11 MED ORDER — LIDOCAINE HCL (CARDIAC) PF 100 MG/5ML IV SOSY
PREFILLED_SYRINGE | INTRAVENOUS | Status: DC | PRN
  Administered 2018-03-11: 100 mg via INTRAVENOUS

## 2018-03-11 MED ORDER — DEXAMETHASONE SODIUM PHOSPHATE 4 MG/ML IJ SOLN
INTRAMUSCULAR | Status: DC | PRN
  Administered 2018-03-11: 6 mg via INTRAVENOUS

## 2018-03-11 MED ORDER — ACETAMINOPHEN 10 MG/ML IV SOLN
INTRAVENOUS | Status: DC | PRN
  Administered 2018-03-11: 1000 mg via INTRAVENOUS

## 2018-03-11 MED ORDER — FENTANYL CITRATE (PF) 100 MCG/2ML IJ SOLN
INTRAMUSCULAR | Status: AC
  Administered 2018-03-11: 25 ug via INTRAVENOUS
  Filled 2018-03-11: qty 2

## 2018-03-11 MED ORDER — ONDANSETRON HCL 4 MG/2ML IJ SOLN: 4.0000 mg | Freq: Once | INTRAMUSCULAR | Status: DC | PRN

## 2018-03-11 MED ORDER — LIDOCAINE HCL (PF) 2 % IJ SOLN
INTRAMUSCULAR | Status: AC
  Filled 2018-03-11: qty 10

## 2018-03-11 MED ORDER — FENTANYL CITRATE (PF) 100 MCG/2ML IJ SOLN
25.0000 ug | INTRAMUSCULAR | Status: DC | PRN
  Administered 2018-03-11 (×2): 25 ug via INTRAVENOUS

## 2018-03-11 MED ORDER — SUGAMMADEX SODIUM 200 MG/2ML IV SOLN
INTRAVENOUS | Status: DC | PRN
  Administered 2018-03-11: 200 mg via INTRAVENOUS

## 2018-03-11 MED ORDER — OXYCODONE HCL 5 MG PO TABS
5.0000 mg | ORAL_TABLET | ORAL | Status: DC | PRN
  Administered 2018-03-11 (×2): 5 mg via ORAL
  Filled 2018-03-11 (×2): qty 2

## 2018-03-11 MED ORDER — BUPIVACAINE-EPINEPHRINE 0.5% -1:200000 IJ SOLN
INTRAMUSCULAR | Status: DC | PRN
  Administered 2018-03-11: 30 mL

## 2018-03-11 MED ORDER — PHENYLEPHRINE HCL-NACL 10-0.9 MG/250ML-% IV SOLN
INTRAVENOUS | Status: DC | PRN
  Administered 2018-03-11: 40 ug/min via INTRAVENOUS

## 2018-03-11 MED ORDER — ROCURONIUM BROMIDE 100 MG/10ML IV SOLN
INTRAVENOUS | Status: DC | PRN
  Administered 2018-03-11: 80 mg via INTRAVENOUS

## 2018-03-11 MED ORDER — OXYCODONE HCL 5 MG PO TABS: 5.0000 mg | ORAL_TABLET | ORAL | 0 refills | Status: DC | PRN

## 2018-03-11 MED ORDER — IPRATROPIUM-ALBUTEROL 0.5-2.5 (3) MG/3ML IN SOLN
RESPIRATORY_TRACT | Status: AC
  Administered 2018-03-11: 3 mL via RESPIRATORY_TRACT
  Filled 2018-03-11: qty 3

## 2018-03-11 MED ORDER — IPRATROPIUM-ALBUTEROL 0.5-2.5 (3) MG/3ML IN SOLN
3.0000 mL | Freq: Once | RESPIRATORY_TRACT | Status: AC
  Administered 2018-03-11: 3 mL via RESPIRATORY_TRACT

## 2018-03-11 MED ORDER — GLYCOPYRROLATE 0.2 MG/ML IJ SOLN
INTRAMUSCULAR | Status: DC | PRN
  Administered 2018-03-11: 0.2 mg via INTRAVENOUS

## 2018-03-11 MED ORDER — BUPIVACAINE-EPINEPHRINE (PF) 0.5% -1:200000 IJ SOLN
INTRAMUSCULAR | Status: AC
  Filled 2018-03-11: qty 30

## 2018-03-11 MED ORDER — LACTATED RINGERS IV SOLN
INTRAVENOUS | Status: DC | PRN
  Administered 2018-03-11: 1 mL

## 2018-03-11 MED ORDER — PROPOFOL 10 MG/ML IV BOLUS
INTRAVENOUS | Status: AC
  Filled 2018-03-11: qty 20

## 2018-03-11 MED ORDER — BUPIVACAINE LIPOSOME 1.3 % IJ SUSP
INTRAMUSCULAR | Status: AC
  Filled 2018-03-11: qty 20

## 2018-03-11 MED ORDER — ONDANSETRON HCL 4 MG/2ML IJ SOLN
INTRAMUSCULAR | Status: DC | PRN
  Administered 2018-03-11: 4 mg via INTRAVENOUS

## 2018-03-11 MED ORDER — OXYCODONE HCL 5 MG PO TABS
ORAL_TABLET | ORAL | Status: AC
  Filled 2018-03-11: qty 1

## 2018-03-11 MED ORDER — FAMOTIDINE 20 MG PO TABS
20.0000 mg | ORAL_TABLET | Freq: Once | ORAL | Status: AC
  Administered 2018-03-11: 20 mg via ORAL

## 2018-03-11 MED ORDER — IPRATROPIUM-ALBUTEROL 0.5-2.5 (3) MG/3ML IN SOLN
RESPIRATORY_TRACT | Status: AC
  Filled 2018-03-11: qty 3

## 2018-03-11 MED ORDER — BUPIVACAINE-EPINEPHRINE (PF) 0.5% -1:200000 IJ SOLN
INTRAMUSCULAR | Status: AC
  Filled 2018-03-11: qty 60

## 2018-03-11 MED ORDER — ROCURONIUM BROMIDE 50 MG/5ML IV SOLN
INTRAVENOUS | Status: AC
  Filled 2018-03-11: qty 1

## 2018-03-11 MED ORDER — GLYCOPYRROLATE 0.2 MG/ML IJ SOLN
INTRAMUSCULAR | Status: AC
  Filled 2018-03-11: qty 1

## 2018-03-11 MED ORDER — FENTANYL CITRATE (PF) 100 MCG/2ML IJ SOLN
INTRAMUSCULAR | Status: DC | PRN
  Administered 2018-03-11: 25 ug via INTRAVENOUS
  Administered 2018-03-11: 100 ug via INTRAVENOUS
  Administered 2018-03-11: 50 ug via INTRAVENOUS

## 2018-03-11 MED ORDER — PROPOFOL 10 MG/ML IV BOLUS
INTRAVENOUS | Status: DC | PRN
  Administered 2018-03-11: 130 mg via INTRAVENOUS

## 2018-03-11 MED ORDER — FENTANYL CITRATE (PF) 250 MCG/5ML IJ SOLN
INTRAMUSCULAR | Status: AC
  Filled 2018-03-11: qty 5

## 2018-03-11 MED ORDER — SODIUM CHLORIDE (PF) 0.9 % IJ SOLN
INTRAMUSCULAR | Status: AC
  Filled 2018-03-11: qty 50

## 2018-03-11 MED ORDER — EPINEPHRINE PF 1 MG/ML IJ SOLN
INTRAMUSCULAR | Status: AC
  Filled 2018-03-11: qty 2

## 2018-03-11 MED ORDER — SODIUM CHLORIDE 0.9 % IV SOLN
INTRAVENOUS | Status: DC
  Administered 2018-03-11: 07:00:00 via INTRAVENOUS

## 2018-03-11 MED ORDER — OXYCODONE HCL 5 MG PO TABS
ORAL_TABLET | ORAL | Status: AC
  Administered 2018-03-11: 5 mg via ORAL
  Filled 2018-03-11: qty 1

## 2018-03-11 MED ORDER — LIDOCAINE HCL (PF) 4 % IJ SOLN
INTRAMUSCULAR | Status: DC | PRN
  Administered 2018-03-11: 4 mL via RESPIRATORY_TRACT

## 2018-03-11 MED ORDER — ACETAMINOPHEN 10 MG/ML IV SOLN
INTRAVENOUS | Status: AC
  Filled 2018-03-11: qty 100

## 2018-03-11 SURGICAL SUPPLY — 52 items
ANCH SUT 2 2.9 2 LD TPR NDL (Anchor) ×2 IMPLANT
ANCH SUT BN ASCP DLV (Anchor) ×1 IMPLANT
ANCH SUT KNTLS STRL SHLDR SYS (Anchor) ×1 IMPLANT
ANCH SUT RGNRT REGENETEN (Staple) ×1 IMPLANT
ANCHOR BONE REGENETEN (Anchor) ×2 IMPLANT
ANCHOR JUGGERKNOT WTAP NDL 2.9 (Anchor) ×4 IMPLANT
ANCHOR SUT QUATTRO KNTLS 4.5 (Anchor) ×2 IMPLANT
ANCHOR TENDON REGENETEN (Staple) ×2 IMPLANT
BIT DRILL JUGRKNT W/NDL BIT2.9 (DRILL) IMPLANT
BLADE FULL RADIUS 3.5 (BLADE) ×3 IMPLANT
BUR ACROMIONIZER 4.0 (BURR) ×3 IMPLANT
CANNULA SHAVER 8MMX76MM (CANNULA) ×3 IMPLANT
CHLORAPREP W/TINT 26ML (MISCELLANEOUS) ×3 IMPLANT
COVER MAYO STAND STRL (DRAPES) ×3 IMPLANT
COVER WAND RF STERILE (DRAPES) ×3 IMPLANT
DRAPE IMP U-DRAPE 54X76 (DRAPES) ×6 IMPLANT
DRILL JUGGERKNOT W/NDL BIT 2.9 (DRILL) ×3
ELECT REM PT RETURN 9FT ADLT (ELECTROSURGICAL) ×3
ELECTRODE REM PT RTRN 9FT ADLT (ELECTROSURGICAL) ×1 IMPLANT
GAUZE PETRO XEROFOAM 1X8 (MISCELLANEOUS) ×3 IMPLANT
GAUZE SPONGE 4X4 12PLY STRL (GAUZE/BANDAGES/DRESSINGS) ×3 IMPLANT
GLOVE BIO SURGEON STRL SZ7.5 (GLOVE) ×6 IMPLANT
GLOVE BIO SURGEON STRL SZ8 (GLOVE) ×6 IMPLANT
GLOVE BIOGEL PI IND STRL 8 (GLOVE) ×1 IMPLANT
GLOVE BIOGEL PI INDICATOR 8 (GLOVE) ×2
GLOVE INDICATOR 8.0 STRL GRN (GLOVE) ×3 IMPLANT
GOWN STRL REUS W/ TWL LRG LVL3 (GOWN DISPOSABLE) ×1 IMPLANT
GOWN STRL REUS W/ TWL XL LVL3 (GOWN DISPOSABLE) ×1 IMPLANT
GOWN STRL REUS W/TWL LRG LVL3 (GOWN DISPOSABLE) ×3
GOWN STRL REUS W/TWL XL LVL3 (GOWN DISPOSABLE) ×3
GRASPER SUT 15 45D LOW PRO (SUTURE) IMPLANT
IMPL REGENETEN MEDIUM (Shoulder) IMPLANT
IMPLANT REGENETEN MEDIUM (Shoulder) ×3 IMPLANT
IV LACTATED RINGER IRRG 3000ML (IV SOLUTION) ×6
IV LR IRRIG 3000ML ARTHROMATIC (IV SOLUTION) ×2 IMPLANT
MANIFOLD NEPTUNE II (INSTRUMENTS) ×3 IMPLANT
MASK FACE SPIDER DISP (MASK) ×3 IMPLANT
MAT ABSORB  FLUID 56X50 GRAY (MISCELLANEOUS) ×2
MAT ABSORB FLUID 56X50 GRAY (MISCELLANEOUS) ×1 IMPLANT
PACK ARTHROSCOPY SHOULDER (MISCELLANEOUS) ×3 IMPLANT
SLING ARM LRG DEEP (SOFTGOODS) ×3 IMPLANT
SLING ULTRA II LG (MISCELLANEOUS) ×3 IMPLANT
STAPLER SKIN PROX 35W (STAPLE) ×3 IMPLANT
STRAP SAFETY 5IN WIDE (MISCELLANEOUS) ×3 IMPLANT
SUT ETHIBOND 0 MO6 C/R (SUTURE) ×3 IMPLANT
SUT VIC AB 2-0 CT1 27 (SUTURE) ×6
SUT VIC AB 2-0 CT1 TAPERPNT 27 (SUTURE) ×2 IMPLANT
TAPE MICROFOAM 4IN (TAPE) ×3 IMPLANT
TUBING ARTHRO INFLOW-ONLY STRL (TUBING) ×3 IMPLANT
TUBING CONNECTING 10 (TUBING) ×2 IMPLANT
TUBING CONNECTING 10' (TUBING) ×1
WAND HAND CNTRL MULTIVAC 90 (MISCELLANEOUS) ×3 IMPLANT

## 2018-03-11 NOTE — Anesthesia Postprocedure Evaluation (Signed)
Anesthesia Post Note  Patient: Theresa Booker  Procedure(s) Performed: SHOULDER ARTHROSCOPY WITH Recurrent  OPEN ROTATOR CUFF REPAIR (Right Shoulder)  Patient location during evaluation: PACU Anesthesia Type: General Level of consciousness: awake and alert Pain management: pain level controlled Vital Signs Assessment: post-procedure vital signs reviewed and stable Respiratory status: spontaneous breathing, nonlabored ventilation, respiratory function stable and patient connected to nasal cannula oxygen Cardiovascular status: blood pressure returned to baseline and stable Postop Assessment: no apparent nausea or vomiting Anesthetic complications: no     Last Vitals:  Vitals:   03/11/18 1114 03/11/18 1214  BP: 136/83 126/71  Pulse: 87 80  Resp:  18  Temp: (!) 36.1 C   SpO2: 90% 92%    Last Pain:  Vitals:   03/11/18 1214  TempSrc:   PainSc: 3                  Martha Clan

## 2018-03-11 NOTE — Anesthesia Procedure Notes (Signed)
Procedure Name: Intubation Date/Time: 03/11/2018 7:49 AM Performed by: Bernardo Heater, CRNA Pre-anesthesia Checklist: Patient identified, Suction available, Emergency Drugs available and Patient being monitored Patient Re-evaluated:Patient Re-evaluated prior to induction Oxygen Delivery Method: Circle system utilized Preoxygenation: Pre-oxygenation with 100% oxygen Induction Type: IV induction Laryngoscope Size: Mac and 3 Grade View: Grade I Tube size: 7.0 mm Number of attempts: 1 Airway Equipment and Method: LTA kit utilized Placement Confirmation: ETT inserted through vocal cords under direct vision,  positive ETCO2 and breath sounds checked- equal and bilateral Secured at: 21 cm Tube secured with: Tape Dental Injury: Teeth and Oropharynx as per pre-operative assessment

## 2018-03-11 NOTE — Op Note (Signed)
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  Post-Op Diagnosis:   Same  Procedure:   Limited arthroscopic debridement with excision of cartilaginous loose bodies, arthroscopic subacromial decompression, and mini-open rotator cuff repair with a [employer] Regeneten patch, right shoulder.  Anesthesia:   GET  Surgeon:   Pascal Lux, MD  Assistant:   Cameron Proud, PA-C  Findings:   As above. There was a near full-thickness recurrent tear involving the supraspinatus insertional fibers with mild retraction. The remainder the rotator cuff was in satisfactory condition. The labrum also was in satisfactory condition. There were extensive grade 3 chondromalacial changes involving the humeral head with loose articular cartilage fragments, and grade 2-3 chondromalacial changes involving the glenoid.  Complications:   None  Fluids:   800 cc  Estimated blood loss:   5 cc  Tourniquet time:   None  Drains:   None  Closure:   Staples      Brief clinical note:   The patient is a 73 year old female who is now nearly 1 year status post a right shoulder arthroscopy with debridement, decompression, rotator cuff repair, and biceps tenodesis. The patient was doing well until about 4 months ago when she developed increased pain in her shoulder after lifting some heavier things. These symptoms have persisted despite medications, activity modification, etc. Her history examination consistent with a recurrent rotator cuff tear confirmed by MRI scan. The patient presents at this time for definitive management of these shoulder symptoms.  Procedure:   The patient was brought into the operating room and lain in the supine position. The patient then underwent general endotracheal intubation and anesthesia before being repositioned in the beach chair position using the beach chair positioner.  The right shoulder and upper extremity were prepped with ChloraPrep solution before being draped sterilely. Preoperative antibiotics were administered. A timeout was performed to confirm the proper surgical site before the expected portal sites and incision site were injected with 0.5% Sensorcaine with epinephrine. A posterior portal was created and the glenohumeral joint thoroughly inspected with the findings as described above. An anterior portal was created using an outside-in technique. The labrum and rotator cuff were further probed, again confirming the above-noted findings. The areas of unstable flaps of articular cartilage were debrided back to stable margins using the full-radius resector.  In addition, numerous small loose articular fragments floating in the joint were debrided as well. Finally, areas of synovitis were debrided back to stable margins using the full-radius resector. The ArthroCare wand was inserted and used to obtain hemostasis as well as to "anneal" the labrum superiorly and anteriorly. The instruments were removed from the joint after suctioning the excess fluid.  The camera was repositioned through the posterior portal into the subacromial space. A separate lateral portal was created using an outside-in technique. The 3.5 mm full-radius resector was introduced and used to perform a subtotal bursectomy. The ArthroCare wand was then inserted and used to remove the periosteal tissue off the undersurface of the anterior third of the acromion as well as to recess the coracoacromial ligament from its attachment along the anterior and lateral margins of the acromion. The 4.0 mm acromionizing bur was introduced and used to complete the decompression by removing the undersurface of the anterior third of the acromion. The full radius resector was reintroduced to remove any residual bony debris before the ArthroCare wand was  reintroduced to obtain hemostasis. The instruments were then removed  from the subacromial space after suctioning the excess fluid.  Utilizing the previous incision, an approximately 4-5 cm incision was made over the anterolateral aspect of the shoulder beginning at the anterolateral corner of the acromion and extending distally in line with the bicipital groove. This incision was carried down through the subcutaneous tissues to expose the deltoid fascia. The raphae between the anterior and middle thirds was identified and this plane developed to provide access into the subacromial space. Additional bursal tissues were debrided sharply using Metzenbaum scissors. The rotator cuff tear was readily identified. The margins were debrided sharply with a #15 blade and the exposed greater tuberosity roughened with a rongeur. The tear was repaired using two Biomet 2.9 mm JuggerKnot anchors. These sutures were then brought back laterally and secured using a single [employer] anchor to create a two-layer closure. An apparent watertight closure was obtained.  In addition, in order to reinforce the repair and to optimize healing, a medium sized Smith [employer] Nephew Regeneten patch was applied over the construct and secured using the appropriate soft tissue and bone staples.  The wound was copiously irrigated with sterile saline solution before the deltoid raphae was reapproximated using 2-0 Vicryl interrupted sutures. The subcutaneous tissues were closed in two layers using 2-0 Vicryl interrupted sutures before the skin was closed using staples. The portal sites also were closed using staples. A total of 30 cc of Exparel was injected in and around the subcutaneous tissues as well as into the subacromial space in order to help with postoperative analgesia. A sterile bulky dressing was applied to the shoulder before the arm was placed into a shoulder immobilizer. The patient was then awakened, extubated, and returned to the recovery room in satisfactory condition after tolerating the procedure  well.

## 2018-03-11 NOTE — Anesthesia Preprocedure Evaluation (Signed)
Anesthesia Evaluation  Patient identified by MRN, date of birth, ID band Patient awake    Reviewed: Allergy & Precautions, H&P , NPO status , Patient's Chart, lab work & pertinent test results, reviewed documented beta blocker date and time   History of Anesthesia Complications Negative for: history of anesthetic complications  Airway Mallampati: III  TM Distance: >3 FB Neck ROM: full    Dental  (+) Upper Dentures, Lower Dentures, Dental Advidsory Given   Pulmonary shortness of breath, with exertion and lying, asthma , neg sleep apnea, neg COPD, neg recent URI,           Cardiovascular Exercise Tolerance: Good hypertension, (-) angina(-) CAD, (-) Past MI, (-) Cardiac Stents and (-) CABG (-) dysrhythmias + Valvular Problems/Murmurs      Neuro/Psych neg Seizures PSYCHIATRIC DISORDERS Depression  Neuromuscular disease    GI/Hepatic Neg liver ROS, GERD  ,  Endo/Other  diabetes  Renal/GU CRFRenal disease  negative genitourinary   Musculoskeletal   Abdominal   Peds  Hematology negative hematology ROS (+)   Anesthesia Other Findings Past Medical History: No date: Asthma No date: Atypical chest pain     Comment:  neg ETT at [facility] No date: CKD (chronic kidney disease), stage III ([place]) No date: Depression No date: Diabetes mellitus without complication ([facility]) No date: Fibromyalgia No date: HLD (hyperlipidemia) No date: Hypertension No date: Vitamin D deficiency   Reproductive/Obstetrics negative OB ROS                             Anesthesia Physical Anesthesia Plan  ASA: III  Anesthesia Plan: General   Post-op Pain Management:    Induction: Intravenous  PONV Risk Score and Plan: 3 and Ondansetron and Dexamethasone  Airway Management Planned: Oral ETT  Additional Equipment:   Intra-op Plan:   Post-operative Plan: Extubation in OR  Informed Consent: I have reviewed the patients  History and Physical, chart, labs and discussed the procedure including the risks, benefits and alternatives for the proposed anesthesia with the patient or authorized representative who has indicated his/her understanding and acceptance.   Dental Advisory Given  Plan Discussed with: Anesthesiologist, CRNA and Surgeon  Anesthesia Plan Comments:         Anesthesia Quick Evaluation

## 2018-03-11 NOTE — Anesthesia Post-op Follow-up Note (Signed)
Anesthesia QCDR form completed.        

## 2018-03-11 NOTE — Transfer of Care (Signed)
Immediate Anesthesia Transfer of Care Note  Patient: Theresa Booker  Procedure(s) Performed: SHOULDER ARTHROSCOPY WITH Recurrent  OPEN ROTATOR CUFF REPAIR (Right Shoulder)  Patient Location: PACU  Anesthesia Type:General  Level of Consciousness: sedated  Airway & Oxygen Therapy: Patient Spontanous Breathing and Patient connected to nasal cannula oxygen  Post-op Assessment: Report given to RN and Post -op Vital signs reviewed and stable  Post vital signs: Reviewed and stable  Last Vitals:  Vitals Value Taken Time  BP 112/72 03/11/2018  9:42 AM  Temp 36.6 C 03/11/2018  9:42 AM  Pulse 82 03/11/2018  9:44 AM  Resp 23 03/11/2018  9:44 AM  SpO2 96 % 03/11/2018  9:44 AM  Vitals shown include unvalidated device data.  Last Pain:  Vitals:   03/11/18 0629  TempSrc: Tympanic  PainSc: 5          Complications: No apparent anesthesia complications

## 2018-03-11 NOTE — Discharge Instructions (Addendum)
AMBULATORY SURGERY  DISCHARGE INSTRUCTIONS   1) The drugs that you were given will stay in your system until tomorrow so for the next 24 hours you should not:  A) Drive an automobile B) Make any legal decisions C) Drink any alcoholic beverage   2) You may resume regular meals tomorrow.  Today it is better to start with liquids and gradually work up to solid foods.  You may eat anything you prefer, but it is better to start with liquids, then soup and crackers, and gradually work up to solid foods.   3) Please notify your doctor immediately if you have any unusual bleeding, trouble breathing, redness and pain at the surgery site, drainage, fever, or pain not relieved by medication. 4)   5) Your post-operative visit with Dr.                                     is: Date:                        Time:    Please call to schedule your post-operative visit.  6) Additional Instructions:       Orthopedic discharge instructions: Keep dressing dry and intact.  May shower after dressing changed on post-op day #4 (Saturday).  Cover staples with Band-Aids after drying off. Apply ice frequently to shoulder. Take ibuprofen 600-800 mg TID with meals for 7-10 days, then as necessary. Take oxycodone as prescribed when needed.  May supplement with ES Tylenol if necessary. Keep shoulder immobilizer on at all times except may remove for bathing purposes. Follow-up in 10-14 days or as scheduled.

## 2018-03-11 NOTE — Progress Notes (Signed)
Vicodin (Hydrocodone-acetaminophen) listed as patient allergy. Pt states this medication makes her nauseated. Oxycodone prescription written for home and on post-op PRN medications. Verified with Altha Harm, PhD, in [place] that this medication is acceptable. Per pharmacist, may give oxycodone as written. Pt. Agreeable.

## 2018-03-11 NOTE — H&P (Signed)
Paper H&P to be scanned into permanent record. H&P reviewed and patient re-examined. No changes. 

## 2018-03-12 ENCOUNTER — Encounter: Payer: Self-pay | Admitting: Surgery

## 2018-04-04 ENCOUNTER — Encounter: Payer: Self-pay | Admitting: Surgery

## 2018-04-25 ENCOUNTER — Other Ambulatory Visit: Payer: Self-pay | Admitting: Family Medicine

## 2018-04-25 DIAGNOSIS — Z13820 Encounter for screening for osteoporosis: Secondary | ICD-10-CM

## 2018-05-01 ENCOUNTER — Other Ambulatory Visit: Payer: Self-pay | Admitting: Family Medicine

## 2018-05-01 DIAGNOSIS — Z1231 Encounter for screening mammogram for malignant neoplasm of breast: Secondary | ICD-10-CM

## 2018-06-12 ENCOUNTER — Other Ambulatory Visit: Payer: Medicare Other

## 2018-07-22 ENCOUNTER — Other Ambulatory Visit: Payer: Self-pay

## 2018-07-22 ENCOUNTER — Emergency Department
Admission: EM | Admit: 2018-07-22 | Discharge: 2018-07-22 | Disposition: A | Discharge status: Home / Self Care | Payer: Medicare Other | Attending: Emergency Medicine | Admitting: Emergency Medicine

## 2018-07-22 ENCOUNTER — Encounter: Payer: Self-pay | Admitting: Emergency Medicine

## 2018-07-22 ENCOUNTER — Emergency Department: Payer: Medicare Other

## 2018-07-22 DIAGNOSIS — R05 Cough: Secondary | ICD-10-CM | POA: Insufficient documentation

## 2018-07-22 DIAGNOSIS — E119 Type 2 diabetes mellitus without complications: Secondary | ICD-10-CM | POA: Diagnosis not present

## 2018-07-22 DIAGNOSIS — J45909 Unspecified asthma, uncomplicated: Secondary | ICD-10-CM | POA: Insufficient documentation

## 2018-07-22 DIAGNOSIS — R42 Dizziness and giddiness: Secondary | ICD-10-CM | POA: Diagnosis not present

## 2018-07-22 DIAGNOSIS — I129 Hypertensive chronic kidney disease with stage 1 through stage 4 chronic kidney disease, or unspecified chronic kidney disease: Secondary | ICD-10-CM | POA: Diagnosis not present

## 2018-07-22 DIAGNOSIS — R0600 Dyspnea, unspecified: Secondary | ICD-10-CM | POA: Diagnosis not present

## 2018-07-22 DIAGNOSIS — Z79899 Other long term (current) drug therapy: Secondary | ICD-10-CM | POA: Insufficient documentation

## 2018-07-22 DIAGNOSIS — I2510 Atherosclerotic heart disease of native coronary artery without angina pectoris: Secondary | ICD-10-CM | POA: Insufficient documentation

## 2018-07-22 DIAGNOSIS — R059 Cough, unspecified: Secondary | ICD-10-CM

## 2018-07-22 DIAGNOSIS — N183 Chronic kidney disease, stage 3 (moderate): Secondary | ICD-10-CM | POA: Insufficient documentation

## 2018-07-22 DIAGNOSIS — E785 Hyperlipidemia, unspecified: Secondary | ICD-10-CM | POA: Diagnosis not present

## 2018-07-22 DIAGNOSIS — Z20828 Contact with and (suspected) exposure to other viral communicable diseases: Secondary | ICD-10-CM | POA: Diagnosis not present

## 2018-07-22 LAB — URINALYSIS, ROUTINE W REFLEX MICROSCOPIC
Bacteria, UA: NONE SEEN
Bilirubin Urine: NEGATIVE
Glucose, UA: >=500 mg/dL — AB
Hgb urine dipstick: NEGATIVE
Ketones, ur: NEGATIVE mg/dL
Leukocytes,Ua: NEGATIVE
Nitrite: NEGATIVE
Protein, ur: 100 mg/dL — AB
Specific Gravity, Urine: 1.022 (ref 1.005–1.030)
pH: 5 (ref 5.0–8.0)

## 2018-07-22 LAB — BRAIN NATRIURETIC PEPTIDE: B Natriuretic Peptide: 13 pg/mL (ref 0.0–100.0)

## 2018-07-22 LAB — COMPREHENSIVE METABOLIC PANEL
ALT: 16 U/L (ref 0–44)
AST: 23 U/L (ref 15–41)
Albumin: 3.5 g/dL (ref 3.5–5.0)
Alkaline Phosphatase: 97 U/L (ref 38–126)
Anion gap: 11 (ref 5–15)
BUN: 27 mg/dL — ABNORMAL HIGH (ref 8–23)
CO2: 25 mmol/L (ref 22–32)
Calcium: 8.7 mg/dL — ABNORMAL LOW (ref 8.9–10.3)
Chloride: 100 mmol/L (ref 98–111)
Creatinine, Ser: 1.71 mg/dL — ABNORMAL HIGH (ref 0.44–1.00)
GFR calc Af Amer: 34 mL/min — ABNORMAL LOW (ref >60)
GFR calc non Af Amer: 29 mL/min — ABNORMAL LOW (ref >60)
Glucose, Bld: 298 mg/dL — ABNORMAL HIGH (ref 70–99)
Potassium: 3.2 mmol/L — ABNORMAL LOW (ref 3.5–5.1)
Sodium: 136 mmol/L (ref 135–145)
Total Bilirubin: 0.4 mg/dL (ref 0.3–1.2)
Total Protein: 7.6 g/dL (ref 6.5–8.1)

## 2018-07-22 LAB — CBC
HCT: 43.1 % (ref 36.0–46.0)
Hemoglobin: 14.4 g/dL (ref 12.0–15.0)
MCH: 28.3 pg (ref 26.0–34.0)
MCHC: 33.4 g/dL (ref 30.0–36.0)
MCV: 84.8 fL (ref 80.0–100.0)
Platelets: 278 10*3/uL (ref 150–400)
RBC: 5.08 MIL/uL (ref 3.87–5.11)
RDW: 13.8 % (ref 11.5–15.5)
WBC: 9.2 10*3/uL (ref 4.0–10.5)
nRBC: 0 % (ref 0.0–0.2)

## 2018-07-22 LAB — TROPONIN I: Troponin I: <0.03 ng/mL (ref <0.03)

## 2018-07-22 LAB — SARS CORONAVIRUS 2 BY RT PCR (HOSPITAL ORDER, PERFORMED IN ~~LOC~~ HOSPITAL LAB): SARS Coronavirus 2: NEGATIVE

## 2018-07-22 MED ORDER — BENZONATATE 100 MG PO CAPS
100.0000 mg | ORAL_CAPSULE | Freq: Once | ORAL | Status: AC
  Administered 2018-07-22: 100 mg via ORAL
  Filled 2018-07-22: qty 1

## 2018-07-22 MED ORDER — BENZONATATE 100 MG PO CAPS: 100.0000 mg | ORAL_CAPSULE | Freq: Four times a day (QID) | ORAL | 0 refills | Status: AC | PRN

## 2018-07-22 NOTE — ED Provider Notes (Addendum)
[facility] Emergency Department Provider Note  Time seen: 9:26 PM  I have reviewed the triage vital signs and the nursing notes.   HISTORY  Chief Complaint Cough and Dizziness   HPI Theresa Booker is a 73 y.o. female with a past medical history of asthma, CKD, diabetes, hypertension, hyperlipidemia, fibromyalgia, presents to the emergency department for shortness of breath cough and congestion.  According to the patient over the past 3 days or so she has been feeling somewhat short of breath, has been coughing with occasional sputum production.  Patient denies any chest pain or abdominal pain.  States some nausea over the past several days with occasional vomiting.  Denies any diarrhea.  No dysuria.  No known sick contacts.  Patient states she has chills at home but denies any measured fever.   Past Medical History:  Diagnosis Date  . Asthma   . Atypical chest pain    neg ETT at [facility]  . CKD (chronic kidney disease), stage III ([place])   . Depression   . Diabetes mellitus without complication ([place])   . Fibromyalgia   . HLD (hyperlipidemia)   . Hypertension   . Vitamin D deficiency     Patient Active Problem List   Diagnosis Date Noted  . Cocaine use 05/03/2015  . Abnormal MRI, lumbar spine 05/03/2015  . Numbness in feet 04/29/2015  . Gait instability 04/29/2015  . BP (high blood pressure) 04/13/2015  . Fibromyalgia 04/13/2015  . Essential (primary) hypertension 04/13/2015  . Clinical depression 04/13/2015  . Chronic kidney disease (CKD), stage III (moderate) ([place]) 04/13/2015  . Airway hyperreactivity 04/13/2015  . Chronic knee pain (Right) 04/13/2015  . Chronic low back pain (Location of Secondary source of pain) (Bilateral) (R>L) 04/13/2015  . Chronic lower straight pain (Location of Primary Source of Pain) (Right) 04/13/2015  . Chronic lumbar radicular pain (Right) (L5 Dermatome) 04/13/2015  . At high risk for falls 04/13/2015  . Chronic hip pain  (Right) 04/13/2015  . Chronic sacroiliac joint pain (Bilateral) (R>L) 04/13/2015  . Controlled type 2 diabetes mellitus without complication ([place]) 37/85/8850  . Headache disorder 02/01/2015  . Dizziness 02/01/2015  . Bad posture 02/01/2015  . Episodic tension type headache 01/10/2015  . Amnesia 10/18/2014  . 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 Acid reflux 09/24/2013  . Chronic pain 09/24/2013  . Avitaminosis D 02/11/2013    Past Surgical History:  Procedure Laterality Date  . ABDOMINAL HYSTERECTOMY    . COLONOSCOPY WITH PROPOFOL N/A 05/22/2016   Procedure: COLONOSCOPY WITH PROPOFOL;  Surgeon: Lollie Sails, MD;  Location: [facility] ENDOSCOPY;  Service: Endoscopy;  Laterality: N/A;  . ESOPHAGOGASTRODUODENOSCOPY (EGD) WITH PROPOFOL N/A 05/22/2016   Procedure: ESOPHAGOGASTRODUODENOSCOPY (EGD) WITH PROPOFOL;  Surgeon: Lollie Sails, MD;  Location: [facility] ENDOSCOPY;  Service: Endoscopy;  Laterality: N/A;  . HAND SURGERY Right 2014   Dr Rudene Christians  . rotator cuff surgery Left 03/2013   DR Rudene Christians  . SHOULDER ARTHROSCOPY WITH OPEN ROTATOR CUFF REPAIR Right 04/25/2017   Procedure: SHOULDER ARTHROSCOPY WITH OPEN ROTATOR CUFF REPAIR;  Surgeon: Corky Mull, MD;  Location: [facility] ORS;  Service: Orthopedics;  Laterality: Right;  . SHOULDER ARTHROSCOPY WITH OPEN ROTATOR CUFF REPAIR Right 03/11/2018   Procedure: SHOULDER ARTHROSCOPY WITH Recurrent  OPEN ROTATOR CUFF REPAIR;  Surgeon: Corky Mull, MD;  Location: [facility] ORS;  Service: Orthopedics;  Laterality: Right;  debridment and decompression    Prior to Admission medications   Medication Sig Start  Date End Date Taking? Authorizing Provider  acetaminophen (TYLENOL) 500 MG tablet Take 500-1,000 mg by mouth every 6 (six) hours as needed for mild pain or headache.     [provider]  albuterol (PROAIR HFA) 108 (90 BASE) MCG/ACT inhaler Inhale 2 puffs into the lungs every 6 (six) hours as needed for wheezing or shortness of  breath.     [provider]  Cholecalciferol (VITAMIN D3) 50 MCG (2000 UT) TABS Take 2,000 Units by mouth daily.    [provider]  diclofenac sodium (VOLTAREN) 1 % GEL Apply 2 g topically 2 (two) times daily as needed. Patient taking differently: Apply 2 g topically 2 (two) times daily as needed (for pain).  09/30/17   Merlyn Lot, MD  hydrochlorothiazide (HYDRODIURIL) 25 MG tablet Take 25 mg by mouth daily.     [provider]  Insulin Glargine (LANTUS SOLOSTAR) 100 UNIT/ML Solostar Pen Inject 43 Units into the skin daily.    [provider]  isosorbide mononitrate (IMDUR) 30 MG 24 hr tablet Take 30 mg by mouth daily.    [provider]  loratadine (CLARITIN) 10 MG tablet Take 10 mg by mouth daily.    [provider]  losartan (COZAAR) 25 MG tablet Take 25 mg by mouth daily.    [provider]  nortriptyline (PAMELOR) 10 MG capsule Take 30 mg by mouth at bedtime.     [provider]  omeprazole (PRILOSEC) 20 MG capsule Take 20 mg by mouth daily.     [provider]  ondansetron (ZOFRAN ODT) 4 MG disintegrating tablet Take 1 tablet (4 mg total) by mouth every 8 (eight) hours as needed for nausea or vomiting. 03/11/18   Poggi, Marshall Cork, MD  oxyCODONE (ROXICODONE) 5 MG immediate release tablet Take 1-2 tablets (5-10 mg total) by mouth every 4 (four) hours as needed. 03/11/18   Poggi, Marshall Cork, MD  sitaGLIPtin (JANUVIA) 100 MG tablet Take 100 mg by mouth daily.    [provider]  SUMAtriptan (IMITREX) 100 MG tablet Take 100 mg by mouth every 2 (two) hours as needed for migraine. May repeat in 2 hours if headache persists or recurs.    [provider]  venlafaxine XR (EFFEXOR-XR) 150 MG 24 hr capsule Take 150 mg by mouth daily with breakfast.     [provider]    Allergies  Allergen Reactions  . Ace Inhibitors Other (See Comments) and Cough    Constant persistent cough  . Pollen Extracts  [Pollen Extract] Other (See Comments)    Sneezing/watery eyes/itchy eye  . Vicodin [Hydrocodone-Acetaminophen] Nausea And Vomiting    Family History  Problem Relation Age of Onset  . Diabetes Mother   . Breast cancer Mother 23  . Cancer Sister   . Cancer Father   . Liver disease Brother   . Breast cancer Maternal Aunt   . Breast cancer Cousin        maternal    Social History Social History   Tobacco Use  . Smoking status: Never Smoker  . Smokeless tobacco: Never Used  Substance Use Topics  . Alcohol use: No    Alcohol/week: 0.0 standard drinks  . Drug use: No    Review of Systems Constitutional: Negative for fever. ENT: Negative for recent illness/congestion Cardiovascular: Negative for chest pain. Respiratory: Negative for shortness of breath.  Negative for cough. Gastrointestinal: Negative for abdominal pain, vomiting  Musculoskeletal: Negative for musculoskeletal complaints Skin: Negative for skin complaints  Neurological: Negative for headache All other ROS negative  ____________________________________________   PHYSICAL EXAM:  VITAL SIGNS: ED Triage Vitals  Enc Vitals Group     BP 07/22/18 2055 140/90     Pulse Rate 07/22/18 2055 (!) 101     Resp 07/22/18 2055 17     Temp 07/22/18 2055 98.3 F (36.8 C)     Temp Source 07/22/18 2055 Oral     SpO2 07/22/18 2055 95 %     Weight 07/22/18 2052 160 lb (72.6 kg)     Height 07/22/18 2052 5\' 1"  (1.549 m)     Head Circumference --      Peak Flow --      Pain Score 07/22/18 2052 5     Pain Loc --      Pain Edu? --      Excl. in [place]? --    Constitutional: Alert and oriented. Well appearing and in no distress. Eyes: Normal exam ENT      Head: Normocephalic and atraumatic      Mouth/Throat: Mucous membranes are moist. Cardiovascular: Normal rate, regular rhythm.  Respiratory: Normal respiratory effort without tachypnea nor retractions. Breath sounds are clear Gastrointestinal: Soft and nontender. No  distention. Musculoskeletal: Nontender with normal range of motion in all extremities. Neurologic:  Normal speech and language. No gross focal neurologic deficits  Skin:  Skin is warm, dry and intact.  Psychiatric: Mood and affect are normal.   ____________________________________________    EKG  EKG viewed and interpreted by myself shows a normal sinus rhythm at 99 bpm with a narrow QRS, normal axis, normal intervals, nonspecific ST changes without ST elevation.  ____________________________________________    RADIOLOGY  Chest x-ray is negative  ____________________________________________   INITIAL IMPRESSION / ASSESSMENT AND PLAN / ED COURSE  Pertinent labs & imaging results that were available during my care of the patient were reviewed by me and considered in my medical decision making (see chart for details).   Patient presents to the emergency department for shortness of breath cough over the past several days.  Does state mild dizziness at times as well.  Differential would include ACS, infectious etiology such as pneumonia, coronavirus, electrolyte abnormality, CHF.  We will check labs, chest x-ray, swab for coronavirus and continue to closely monitor.  Patient agreeable to plan of care.  Chest x-ray is negative.  Labs are largely within normal limits.  Coronavirus test is negative.  Urinalysis pending.  Patient care signed out to oncoming physician.  I suspect likely discharge home with PCP follow-up and supportive care.  Theresa Booker was evaluated in Emergency Department on 07/22/2018 for the symptoms described in the history of present illness. She was evaluated in the context of the global COVID-19 pandemic, which necessitated consideration that the patient might be at risk for infection with the SARS-CoV-2 virus that causes COVID-19. Institutional protocols and algorithms that pertain to the evaluation of patients at risk for COVID-19 are in a state of rapid change based  on information released by regulatory bodies including the CDC and federal and state organizations. These policies and algorithms were followed during the patient's care in the ED.  ____________________________________________   FINAL CLINICAL IMPRESSION(S) / ED DIAGNOSES  Shortness of breath   Harvest Dark, MD 07/22/18 7035    Harvest Dark, MD 07/22/18 2302

## 2018-07-22 NOTE — ED Triage Notes (Signed)
Patient ambulatory to triage with steady gait, without difficulty or distress noted, mask placed; pt reports last several days having prod cough yellow sputum, congestion, neck and upper back pain with dizziness

## 2018-07-26 ENCOUNTER — Encounter: Payer: Self-pay | Admitting: Emergency Medicine

## 2018-07-26 ENCOUNTER — Emergency Department
Admission: EM | Admit: 2018-07-26 | Discharge: 2018-07-26 | Disposition: A | Discharge status: Home / Self Care | Payer: Medicare Other | Attending: Emergency Medicine | Admitting: Emergency Medicine

## 2018-07-26 ENCOUNTER — Emergency Department: Payer: Medicare Other

## 2018-07-26 ENCOUNTER — Other Ambulatory Visit: Payer: Self-pay

## 2018-07-26 DIAGNOSIS — Z1159 Encounter for screening for other viral diseases: Secondary | ICD-10-CM | POA: Insufficient documentation

## 2018-07-26 DIAGNOSIS — E1122 Type 2 diabetes mellitus with diabetic chronic kidney disease: Secondary | ICD-10-CM | POA: Insufficient documentation

## 2018-07-26 DIAGNOSIS — R42 Dizziness and giddiness: Secondary | ICD-10-CM | POA: Diagnosis not present

## 2018-07-26 DIAGNOSIS — R05 Cough: Secondary | ICD-10-CM | POA: Diagnosis present

## 2018-07-26 DIAGNOSIS — N183 Chronic kidney disease, stage 3 (moderate): Secondary | ICD-10-CM | POA: Diagnosis not present

## 2018-07-26 DIAGNOSIS — J40 Bronchitis, not specified as acute or chronic: Secondary | ICD-10-CM | POA: Diagnosis not present

## 2018-07-26 DIAGNOSIS — R51 Headache: Secondary | ICD-10-CM | POA: Diagnosis not present

## 2018-07-26 DIAGNOSIS — I129 Hypertensive chronic kidney disease with stage 1 through stage 4 chronic kidney disease, or unspecified chronic kidney disease: Secondary | ICD-10-CM | POA: Diagnosis not present

## 2018-07-26 LAB — BASIC METABOLIC PANEL
Anion gap: 9 (ref 5–15)
BUN: 24 mg/dL — ABNORMAL HIGH (ref 8–23)
CO2: 27 mmol/L (ref 22–32)
Calcium: 9 mg/dL (ref 8.9–10.3)
Chloride: 103 mmol/L (ref 98–111)
Creatinine, Ser: 1.43 mg/dL — ABNORMAL HIGH (ref 0.44–1.00)
GFR calc Af Amer: 42 mL/min — ABNORMAL LOW (ref >60)
GFR calc non Af Amer: 36 mL/min — ABNORMAL LOW (ref >60)
Glucose, Bld: 134 mg/dL — ABNORMAL HIGH (ref 70–99)
Potassium: 3.5 mmol/L (ref 3.5–5.1)
Sodium: 139 mmol/L (ref 135–145)

## 2018-07-26 LAB — CBC WITH DIFFERENTIAL/PLATELET
Abs Immature Granulocytes: 0.05 10*3/uL (ref 0.00–0.07)
Basophils Absolute: 0.1 10*3/uL (ref 0.0–0.1)
Basophils Relative: 1 %
Eosinophils Absolute: 0.2 10*3/uL (ref 0.0–0.5)
Eosinophils Relative: 2 %
HCT: 42.7 % (ref 36.0–46.0)
Hemoglobin: 14 g/dL (ref 12.0–15.0)
Immature Granulocytes: 1 %
Lymphocytes Relative: 33 %
Lymphs Abs: 3.3 10*3/uL (ref 0.7–4.0)
MCH: 28.6 pg (ref 26.0–34.0)
MCHC: 32.8 g/dL (ref 30.0–36.0)
MCV: 87.3 fL (ref 80.0–100.0)
Monocytes Absolute: 0.9 10*3/uL (ref 0.1–1.0)
Monocytes Relative: 9 %
Neutro Abs: 5.5 10*3/uL (ref 1.7–7.7)
Neutrophils Relative %: 54 %
Platelets: 293 10*3/uL (ref 150–400)
RBC: 4.89 MIL/uL (ref 3.87–5.11)
RDW: 13.9 % (ref 11.5–15.5)
WBC: 10 10*3/uL (ref 4.0–10.5)
nRBC: 0 % (ref 0.0–0.2)

## 2018-07-26 LAB — TROPONIN I: Troponin I: <0.03 ng/mL (ref <0.03)

## 2018-07-26 LAB — BRAIN NATRIURETIC PEPTIDE: B Natriuretic Peptide: 48 pg/mL (ref 0.0–100.0)

## 2018-07-26 LAB — SARS CORONAVIRUS 2 BY RT PCR (HOSPITAL ORDER, PERFORMED IN ~~LOC~~ HOSPITAL LAB): SARS Coronavirus 2: NEGATIVE

## 2018-07-26 MED ORDER — SODIUM CHLORIDE 0.9 % IV SOLN
Freq: Once | INTRAVENOUS | Status: AC
  Administered 2018-07-26: 14:00:00 via INTRAVENOUS

## 2018-07-26 MED ORDER — AZITHROMYCIN 250 MG PO TABS: ORAL_TABLET | ORAL | 0 refills | Status: DC

## 2018-07-26 MED ORDER — METOCLOPRAMIDE HCL 5 MG/ML IJ SOLN
10.0000 mg | Freq: Once | INTRAMUSCULAR | Status: AC
  Administered 2018-07-26: 10 mg via INTRAVENOUS
  Filled 2018-07-26: qty 2

## 2018-07-26 NOTE — Discharge Instructions (Addendum)
Take the prescription antibiotic in addition to the meds prescribed on Tuesday. Follow-up with [facility] or return as needed.

## 2018-07-26 NOTE — ED Provider Notes (Signed)
[facility] Emergency Department Provider Note       Time seen: ----------------------------------------- 1:27 PM on 07/26/2018 -----------------------------------------   I have reviewed the triage vital signs and the nursing notes.  HISTORY   Chief Complaint Cough and Headache   HPI Theresa Booker is a 73 y.o. female with a history of asthma, CKD, depression, diabetes, fibromyalgia, hypertension who presents to the ED for persistent cough and headache over the last week.  She was seen several days ago for the same and she is no better.  She denies fevers or chills, does complain of headache and dizziness with persistent cough.  Past Medical History:  Diagnosis Date  . Asthma   . Atypical chest pain    neg ETT at [facility]  . CKD (chronic kidney disease), stage III ([place])   . Depression   . Diabetes mellitus without complication ([place])   . Fibromyalgia   . HLD (hyperlipidemia)   . Hypertension   . Vitamin D deficiency     Patient Active Problem List   Diagnosis Date Noted  . Cocaine use 05/03/2015  . Abnormal MRI, lumbar spine 05/03/2015  . Numbness in feet 04/29/2015  . Gait instability 04/29/2015  . BP (high blood pressure) 04/13/2015  . Fibromyalgia 04/13/2015  . Essential (primary) hypertension 04/13/2015  . Clinical depression 04/13/2015  . Chronic kidney disease (CKD), stage III (moderate) ([place]) 04/13/2015  . Airway hyperreactivity 04/13/2015  . Chronic knee pain (Right) 04/13/2015  . Chronic low back pain (Location of Secondary source of pain) (Bilateral) (R>L) 04/13/2015  . Chronic lower straight pain (Location of Primary Source of Pain) (Right) 04/13/2015  . Chronic lumbar radicular pain (Right) (L5 Dermatome) 04/13/2015  . At high risk for falls 04/13/2015  . Chronic hip pain (Right) 04/13/2015  . Chronic sacroiliac joint pain (Bilateral) (R>L) 04/13/2015  . Controlled type 2 diabetes mellitus without complication ([place]) 62/95/2841  .  Headache disorder 02/01/2015  . Dizziness 02/01/2015  . Bad posture 02/01/2015  . Episodic tension type headache 01/10/2015  . Amnesia 10/18/2014  . 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 Acid reflux 09/24/2013  . Chronic pain 09/24/2013  . Avitaminosis D 02/11/2013    Past Surgical History:  Procedure Laterality Date  . ABDOMINAL HYSTERECTOMY    . COLONOSCOPY WITH PROPOFOL N/A 05/22/2016   Procedure: COLONOSCOPY WITH PROPOFOL;  Surgeon: Lollie Sails, MD;  Location: [facility] ENDOSCOPY;  Service: Endoscopy;  Laterality: N/A;  . ESOPHAGOGASTRODUODENOSCOPY (EGD) WITH PROPOFOL N/A 05/22/2016   Procedure: ESOPHAGOGASTRODUODENOSCOPY (EGD) WITH PROPOFOL;  Surgeon: Lollie Sails, MD;  Location: [facility] ENDOSCOPY;  Service: Endoscopy;  Laterality: N/A;  . HAND SURGERY Right 2014   Dr Rudene Christians  . rotator cuff surgery Left 03/2013   DR Rudene Christians  . SHOULDER ARTHROSCOPY WITH OPEN ROTATOR CUFF REPAIR Right 04/25/2017   Procedure: SHOULDER ARTHROSCOPY WITH OPEN ROTATOR CUFF REPAIR;  Surgeon: Corky Mull, MD;  Location: [facility] ORS;  Service: Orthopedics;  Laterality: Right;  . SHOULDER ARTHROSCOPY WITH OPEN ROTATOR CUFF REPAIR Right 03/11/2018   Procedure: SHOULDER ARTHROSCOPY WITH Recurrent  OPEN ROTATOR CUFF REPAIR;  Surgeon: Corky Mull, MD;  Location: [facility] ORS;  Service: Orthopedics;  Laterality: Right;  debridment and decompression    Allergies Ace inhibitors; Pollen extracts [pollen extract]; and Vicodin [hydrocodone-acetaminophen]  Social History Social History   Tobacco Use  . Smoking status: Never Smoker  . Smokeless tobacco: Never Used  Substance Use Topics  . Alcohol use: No  Alcohol/week: 0.0 standard drinks  . Drug use: No   Review of Systems Constitutional: Negative for fever. Cardiovascular: Negative for chest pain. Respiratory: Negative for shortness of breath.  Positive for cough Gastrointestinal: Negative for abdominal pain, vomiting and  diarrhea. Musculoskeletal: Negative for back pain. Skin: Negative for rash. Neurological: Positive for headache  All systems negative/normal/unremarkable except as stated in the HPI  ____________________________________________   PHYSICAL EXAM:  VITAL SIGNS: ED Triage Vitals  Enc Vitals Group     BP 07/26/18 1224 (!) 125/92     Pulse Rate 07/26/18 1224 99     Resp 07/26/18 1224 18     Temp 07/26/18 1224 98.3 F (36.8 C)     Temp src --      SpO2 07/26/18 1224 100 %     Weight 07/26/18 1225 160 lb (72.6 kg)     Height 07/26/18 1225 5\' 1"  (1.549 m)     Head Circumference --      Peak Flow --      Pain Score 07/26/18 1223 9     Pain Loc --      Pain Edu? --      Excl. in [place]? --    Constitutional: Alert and oriented. Well appearing and in no distress. Eyes: Conjunctivae are normal. Normal extraocular movements. ENT      Head: Normocephalic and atraumatic.      Nose: No congestion/rhinnorhea.      Mouth/Throat: Mucous membranes are moist.      Neck: No stridor. Cardiovascular: Normal rate, regular rhythm. No murmurs, rubs, or gallops. Respiratory: Normal respiratory effort without tachypnea nor retractions. Breath sounds are clear and equal bilaterally. No wheezes/rales/rhonchi. Gastrointestinal: Soft and nontender. Normal bowel sounds Musculoskeletal: Nontender with normal range of motion in extremities. No lower extremity tenderness nor edema. Neurologic:  Normal speech and language. No gross focal neurologic deficits are appreciated.  Skin:  Skin is warm, dry and intact. No rash noted. Psychiatric: Mood and affect are normal. Speech and behavior are normal.  ____________________________________________  ED COURSE:  As part of my medical decision making, I reviewed the following data within the [place] History obtained from family if available, nursing notes, old chart and ekg, as well as notes from prior ED visits. Patient presented for cough and  headache, we will assess with labs and imaging as indicated at this time.   Procedures  Theresa Booker was evaluated in Emergency Department on 07/26/2018 for the symptoms described in the history of present illness. She was evaluated in the context of the global COVID-19 pandemic, which necessitated consideration that the patient might be at risk for infection with the SARS-CoV-2 virus that causes COVID-19. Institutional protocols and algorithms that pertain to the evaluation of patients at risk for COVID-19 are in a state of rapid change based on information released by regulatory bodies including the CDC and federal and state organizations. These policies and algorithms were followed during the patient's care in the ED.  ____________________________________________   LABS (pertinent positives/negatives)  Labs Reviewed  BASIC METABOLIC PANEL - Abnormal; Notable for the following components:      Result Value   Glucose, Bld 134 (*)    BUN 24 (*)    Creatinine, Ser 1.43 (*)    GFR calc non Af Amer 36 (*)    GFR calc Af Amer 42 (*)    All other components within normal limits  SARS CORONAVIRUS 2 (HOSPITAL ORDER, [place]  LAB)  CBC WITH DIFFERENTIAL/PLATELET  TROPONIN I  BRAIN NATRIURETIC PEPTIDE    RADIOLOGY Images were viewed by me  Chest x-ray IMPRESSION: Negative for acute cardiopulmonary disease ____________________________________________   DIFFERENTIAL DIAGNOSIS   Chronic pain, viral illness, bronchitis, pneumonia, dehydration, migraine, tension headache  FINAL ASSESSMENT AND PLAN  Cough and headache   Plan: The patient had presented for cough and headache. Patient's labs do not reveal any acute process. Patient's imaging was reassuring.  She will be discharged with antibiotics and outpatient follow-up as needed.   Laurence Aly, MD    Note: This note was generated in part or whole with voice recognition software. Voice recognition is  usually quite accurate but there are transcription errors that can and very often do occur. I apologize for any typographical errors that were not detected and corrected.     Earleen Newport, MD 07/26/18 [phone]

## 2018-07-26 NOTE — ED Triage Notes (Signed)
Pt to ED via POV c/o cough and headache x 1 week. Pt states that she was seen on Tuesday for same and she is no better.

## 2018-07-26 NOTE — ED Provider Notes (Signed)
-----------------------------------------   3:15 PM on 07/26/2018 ----------------------------------------- Assuming care from Dr. Jimmye Norman.  In short, Theresa Booker is a 73 y.o. female with a chief complaint of Cough and Headache  .  Refer to the original H&P for additional details.  The current plan of care is to await COVID-19 test results and discharge with prescriptions for bronchitis.  Physical Exam  BP (!) 158/103 (BP Location: Left Arm)   Pulse 85   Temp 98.3 F (36.8 C)   Resp 18   Ht 5\' 1"  (1.549 m)   Wt 72.6 kg   SpO2 100%   BMI 30.23 kg/m   Physical Exam  ED Course/Procedures   Procedures   NS 1000 ml IVP Reglan 10 gm IVP  MDM  Patient with ED evaluation of a one-week complaint of intermittent cough and headache.  Patient was evaluated on Tuesday for similar symptoms, and reports no improvement overall.  She presented today for evaluation and was tested for coronavirus which was negative at this time.  Her chest x-ray is overall reassuring at this time and labs are within normal limits.  Patient symptoms likely represent an acute bronchitis/bronchospasm.  Patient will be discharged with azithromycin to take in addition to the previously prescribed albuterol and Tessalon Perles.  Follow with primary provider or return to the ED as needed.       Melvenia Needles, PA-C 07/26/18 1517    Earleen Newport, MD 07/27/18 [phone]

## 2018-07-29 ENCOUNTER — Other Ambulatory Visit: Payer: Self-pay

## 2018-07-29 ENCOUNTER — Emergency Department: Payer: Medicare Other

## 2018-07-29 ENCOUNTER — Observation Stay
Admission: EM | Admit: 2018-07-29 | Discharge: 2018-07-31 | Disposition: A | Discharge status: Home / Self Care | Payer: Medicare Other | Attending: Family Medicine | Admitting: Family Medicine

## 2018-07-29 DIAGNOSIS — J45909 Unspecified asthma, uncomplicated: Secondary | ICD-10-CM | POA: Diagnosis not present

## 2018-07-29 DIAGNOSIS — G8929 Other chronic pain: Secondary | ICD-10-CM | POA: Diagnosis not present

## 2018-07-29 DIAGNOSIS — Z791 Long term (current) use of non-steroidal anti-inflammatories (NSAID): Secondary | ICD-10-CM | POA: Diagnosis not present

## 2018-07-29 DIAGNOSIS — R51 Headache: Secondary | ICD-10-CM | POA: Diagnosis present

## 2018-07-29 DIAGNOSIS — M797 Fibromyalgia: Secondary | ICD-10-CM | POA: Diagnosis not present

## 2018-07-29 DIAGNOSIS — I129 Hypertensive chronic kidney disease with stage 1 through stage 4 chronic kidney disease, or unspecified chronic kidney disease: Secondary | ICD-10-CM | POA: Diagnosis not present

## 2018-07-29 DIAGNOSIS — R2681 Unsteadiness on feet: Secondary | ICD-10-CM | POA: Diagnosis not present

## 2018-07-29 DIAGNOSIS — N183 Chronic kidney disease, stage 3 (moderate): Secondary | ICD-10-CM | POA: Insufficient documentation

## 2018-07-29 DIAGNOSIS — Z794 Long term (current) use of insulin: Secondary | ICD-10-CM | POA: Diagnosis not present

## 2018-07-29 DIAGNOSIS — Z7951 Long term (current) use of inhaled steroids: Secondary | ICD-10-CM | POA: Diagnosis not present

## 2018-07-29 DIAGNOSIS — E785 Hyperlipidemia, unspecified: Secondary | ICD-10-CM | POA: Diagnosis not present

## 2018-07-29 DIAGNOSIS — Z79899 Other long term (current) drug therapy: Secondary | ICD-10-CM | POA: Insufficient documentation

## 2018-07-29 DIAGNOSIS — Z1159 Encounter for screening for other viral diseases: Secondary | ICD-10-CM | POA: Diagnosis not present

## 2018-07-29 DIAGNOSIS — F329 Major depressive disorder, single episode, unspecified: Secondary | ICD-10-CM | POA: Diagnosis not present

## 2018-07-29 DIAGNOSIS — R4182 Altered mental status, unspecified: Secondary | ICD-10-CM | POA: Diagnosis present

## 2018-07-29 DIAGNOSIS — E1122 Type 2 diabetes mellitus with diabetic chronic kidney disease: Secondary | ICD-10-CM | POA: Diagnosis not present

## 2018-07-29 DIAGNOSIS — E559 Vitamin D deficiency, unspecified: Secondary | ICD-10-CM | POA: Diagnosis not present

## 2018-07-29 DIAGNOSIS — R519 Headache, unspecified: Secondary | ICD-10-CM

## 2018-07-29 LAB — DIFFERENTIAL
Abs Immature Granulocytes: 0.04 10*3/uL (ref 0.00–0.07)
Basophils Absolute: 0.1 10*3/uL (ref 0.0–0.1)
Basophils Relative: 1 %
Eosinophils Absolute: 0.2 10*3/uL (ref 0.0–0.5)
Eosinophils Relative: 2 %
Immature Granulocytes: 0 %
Lymphocytes Relative: 35 %
Lymphs Abs: 3.3 10*3/uL (ref 0.7–4.0)
Monocytes Absolute: 0.8 10*3/uL (ref 0.1–1.0)
Monocytes Relative: 9 %
Neutro Abs: 5 10*3/uL (ref 1.7–7.7)
Neutrophils Relative %: 53 %

## 2018-07-29 LAB — COMPREHENSIVE METABOLIC PANEL
ALT: 17 U/L (ref 0–44)
AST: 19 U/L (ref 15–41)
Albumin: 3.7 g/dL (ref 3.5–5.0)
Alkaline Phosphatase: 88 U/L (ref 38–126)
Anion gap: 9 (ref 5–15)
BUN: 19 mg/dL (ref 8–23)
CO2: 28 mmol/L (ref 22–32)
Calcium: 8.9 mg/dL (ref 8.9–10.3)
Chloride: 101 mmol/L (ref 98–111)
Creatinine, Ser: 1.37 mg/dL — ABNORMAL HIGH (ref 0.44–1.00)
GFR calc Af Amer: 44 mL/min — ABNORMAL LOW (ref >60)
GFR calc non Af Amer: 38 mL/min — ABNORMAL LOW (ref >60)
Glucose, Bld: 206 mg/dL — ABNORMAL HIGH (ref 70–99)
Potassium: 3.6 mmol/L (ref 3.5–5.1)
Sodium: 138 mmol/L (ref 135–145)
Total Bilirubin: 0.4 mg/dL (ref 0.3–1.2)
Total Protein: 7.8 g/dL (ref 6.5–8.1)

## 2018-07-29 LAB — URINE DRUG SCREEN, QUALITATIVE (ARMC ONLY)
Amphetamines, Ur Screen: NOT DETECTED
Barbiturates, Ur Screen: NOT DETECTED
Benzodiazepine, Ur Scrn: NOT DETECTED
Cannabinoid 50 Ng, Ur ~~LOC~~: NOT DETECTED
Cocaine Metabolite,Ur ~~LOC~~: NOT DETECTED
MDMA (Ecstasy)Ur Screen: NOT DETECTED
Methadone Scn, Ur: NOT DETECTED
Opiate, Ur Screen: NOT DETECTED
Phencyclidine (PCP) Ur S: NOT DETECTED
Tricyclic, Ur Screen: POSITIVE — AB

## 2018-07-29 LAB — URINALYSIS, COMPLETE (UACMP) WITH MICROSCOPIC
Bacteria, UA: NONE SEEN
Bilirubin Urine: NEGATIVE
Glucose, UA: NEGATIVE mg/dL
Hgb urine dipstick: NEGATIVE
Ketones, ur: NEGATIVE mg/dL
Leukocytes,Ua: NEGATIVE
Nitrite: NEGATIVE
Protein, ur: 100 mg/dL — AB
Specific Gravity, Urine: 1.017 (ref 1.005–1.030)
pH: 6 (ref 5.0–8.0)

## 2018-07-29 LAB — GLUCOSE, CAPILLARY
Glucose-Capillary: 175 mg/dL — ABNORMAL HIGH (ref 70–99)
Glucose-Capillary: 153 mg/dL — ABNORMAL HIGH (ref 70–99)

## 2018-07-29 LAB — CBC
HCT: 42.9 % (ref 36.0–46.0)
Hemoglobin: 13.9 g/dL (ref 12.0–15.0)
MCH: 28.1 pg (ref 26.0–34.0)
MCHC: 32.4 g/dL (ref 30.0–36.0)
MCV: 86.7 fL (ref 80.0–100.0)
Platelets: 291 10*3/uL (ref 150–400)
RBC: 4.95 MIL/uL (ref 3.87–5.11)
RDW: 13.7 % (ref 11.5–15.5)
WBC: 9.4 10*3/uL (ref 4.0–10.5)
nRBC: 0 % (ref 0.0–0.2)

## 2018-07-29 LAB — APTT: aPTT: 28 seconds (ref 24–36)

## 2018-07-29 LAB — PROTIME-INR
INR: 1.1 (ref 0.8–1.2)
Prothrombin Time: 14 seconds (ref 11.4–15.2)

## 2018-07-29 LAB — ABO/RH: ABO/RH(D): O POS

## 2018-07-29 LAB — SARS CORONAVIRUS 2 BY RT PCR (HOSPITAL ORDER, PERFORMED IN ~~LOC~~ HOSPITAL LAB): SARS Coronavirus 2: NEGATIVE

## 2018-07-29 MED ORDER — INSULIN ASPART 100 UNIT/ML ~~LOC~~ SOLN
0.0000 [IU] | Freq: Every day | SUBCUTANEOUS | Status: DC
  Administered 2018-07-30: 23:00:00 2 [IU] via SUBCUTANEOUS
  Filled 2018-07-29: qty 1

## 2018-07-29 MED ORDER — SODIUM CHLORIDE 0.9 % IV SOLN
INTRAVENOUS | Status: DC
  Administered 2018-07-29: 23:00:00 via INTRAVENOUS

## 2018-07-29 MED ORDER — ADULT MULTIVITAMIN W/MINERALS CH
1.0000 | ORAL_TABLET | Freq: Every day | ORAL | Status: DC
  Administered 2018-07-30 – 2018-07-31 (×2): 1 via ORAL
  Filled 2018-07-29 (×2): qty 1

## 2018-07-29 MED ORDER — HYDROCHLOROTHIAZIDE 25 MG PO TABS
25.0000 mg | ORAL_TABLET | Freq: Every day | ORAL | Status: DC
  Administered 2018-07-30 – 2018-07-31 (×2): 25 mg via ORAL
  Filled 2018-07-29 (×2): qty 1

## 2018-07-29 MED ORDER — LINAGLIPTIN 5 MG PO TABS
5.0000 mg | ORAL_TABLET | Freq: Every day | ORAL | Status: DC
  Administered 2018-07-30 – 2018-07-31 (×2): 5 mg via ORAL
  Filled 2018-07-29 (×2): qty 1

## 2018-07-29 MED ORDER — NORTRIPTYLINE HCL 10 MG PO CAPS
30.0000 mg | ORAL_CAPSULE | Freq: Every day | ORAL | Status: DC
  Administered 2018-07-30: 30 mg via ORAL
  Filled 2018-07-29 (×2): qty 3

## 2018-07-29 MED ORDER — INSULIN GLARGINE 100 UNIT/ML ~~LOC~~ SOLN
43.0000 [IU] | Freq: Every day | SUBCUTANEOUS | Status: DC
  Administered 2018-07-30: 23:00:00 43 [IU] via SUBCUTANEOUS
  Filled 2018-07-29 (×3): qty 0.43

## 2018-07-29 MED ORDER — ENOXAPARIN SODIUM 40 MG/0.4ML ~~LOC~~ SOLN
40.0000 mg | SUBCUTANEOUS | Status: DC
  Filled 2018-07-29: qty 0.4

## 2018-07-29 MED ORDER — ISOSORBIDE MONONITRATE ER 30 MG PO TB24
30.0000 mg | ORAL_TABLET | Freq: Every day | ORAL | Status: DC
  Administered 2018-07-30 – 2018-07-31 (×2): 30 mg via ORAL
  Filled 2018-07-29 (×2): qty 1

## 2018-07-29 MED ORDER — VENLAFAXINE HCL ER 150 MG PO CP24
150.0000 mg | ORAL_CAPSULE | Freq: Every day | ORAL | Status: DC
  Administered 2018-07-30 – 2018-07-31 (×2): 150 mg via ORAL
  Filled 2018-07-29 (×2): qty 1
  Filled 2018-07-29: qty 2

## 2018-07-29 MED ORDER — LOSARTAN POTASSIUM 25 MG PO TABS
25.0000 mg | ORAL_TABLET | Freq: Every day | ORAL | Status: DC
  Administered 2018-07-30 – 2018-07-31 (×2): 25 mg via ORAL
  Filled 2018-07-29 (×2): qty 1

## 2018-07-29 MED ORDER — FOLIC ACID 1 MG PO TABS
1.0000 mg | ORAL_TABLET | Freq: Every day | ORAL | Status: DC
  Administered 2018-07-30 – 2018-07-31 (×2): 1 mg via ORAL
  Filled 2018-07-29 (×2): qty 1

## 2018-07-29 MED ORDER — LORATADINE 10 MG PO TABS
10.0000 mg | ORAL_TABLET | Freq: Every day | ORAL | Status: DC
  Administered 2018-07-30 – 2018-07-31 (×2): 10 mg via ORAL
  Filled 2018-07-29 (×2): qty 1

## 2018-07-29 MED ORDER — SODIUM CHLORIDE 0.9% FLUSH: 3.0000 mL | Freq: Once | INTRAVENOUS | Status: DC

## 2018-07-29 MED ORDER — INSULIN ASPART 100 UNIT/ML ~~LOC~~ SOLN
0.0000 [IU] | Freq: Three times a day (TID) | SUBCUTANEOUS | Status: DC
  Administered 2018-07-30: 18:00:00 5 [IU] via SUBCUTANEOUS
  Administered 2018-07-30: 08:00:00 8 [IU] via SUBCUTANEOUS
  Administered 2018-07-30: 12:00:00 3 [IU] via SUBCUTANEOUS
  Administered 2018-07-31: 12:00:00 5 [IU] via SUBCUTANEOUS
  Filled 2018-07-29 (×4): qty 1

## 2018-07-29 MED ORDER — ONDANSETRON HCL 4 MG/2ML IJ SOLN: 4.0000 mg | Freq: Four times a day (QID) | INTRAMUSCULAR | Status: DC | PRN

## 2018-07-29 MED ORDER — PANTOPRAZOLE SODIUM 40 MG PO TBEC
40.0000 mg | DELAYED_RELEASE_TABLET | Freq: Every day | ORAL | Status: DC
  Administered 2018-07-30 – 2018-07-31 (×2): 40 mg via ORAL
  Filled 2018-07-29 (×2): qty 1

## 2018-07-29 MED ORDER — POLYETHYLENE GLYCOL 3350 17 G PO PACK: 17.0000 g | PACK | Freq: Every day | ORAL | Status: DC | PRN

## 2018-07-29 MED ORDER — ONDANSETRON HCL 4 MG PO TABS: 4.0000 mg | ORAL_TABLET | Freq: Four times a day (QID) | ORAL | Status: DC | PRN

## 2018-07-29 MED ORDER — SODIUM CHLORIDE 0.9% FLUSH
3.0000 mL | Freq: Two times a day (BID) | INTRAVENOUS | Status: DC
  Administered 2018-07-29 – 2018-07-31 (×4): 3 mL via INTRAVENOUS

## 2018-07-29 MED ORDER — IPRATROPIUM-ALBUTEROL 0.5-2.5 (3) MG/3ML IN SOLN: 3.0000 mL | Freq: Four times a day (QID) | RESPIRATORY_TRACT | Status: DC | PRN

## 2018-07-29 MED ORDER — VITAMIN B-1 100 MG PO TABS
100.0000 mg | ORAL_TABLET | Freq: Every day | ORAL | Status: DC
  Administered 2018-07-30 – 2018-07-31 (×2): 100 mg via ORAL
  Filled 2018-07-29 (×2): qty 1

## 2018-07-29 NOTE — H&P (Addendum)
Patient seen and examined, agree with detailed note below.  Patient with headaches for almost a week and intermittent confusion.  Takes oxycodone for arthritis which we will hold for now.  CT and MRI of the brain reviewed with no acute abnormalities. -2 ER visits for the same.  Neurology consult, neurochecks and physical therapy consult -Hold any narcotics or benzos.  Urine drug screen ordered -Labs reviewed.  Continue her other medications for diabetes and hypertension.  - Gladstone Lighter, M.D [facility] [address], [place], [place] [address]    ---------------------------------------------------------------------------------------------------------------------------------- .    [facility] [place] NAME: Theresa Booker    MR#:  [id number]  DATE OF BIRTH:  [birth year]  DATE OF ADMISSION:  07/29/2018  PRIMARY CARE PHYSICIAN: [facility] [place]   REQUESTING/REFERRING PHYSICIAN: Harvest Dark, MD  CHIEF COMPLAINT:   Chief Complaint  Patient presents with  . Altered Mental Status    HISTORY OF PRESENT ILLNESS:  Theresa Booker  is a 73 y.o. female with a known history of asthma, CKD stage III, depression, diabetes mellitus, fibromyalgia, and hypertension.  She presents to the emergency room accompanied by her daughter who reports a one-week history of nagging frontal headache and new onset confusion.  According to documentation, patient has been seen 3 times prior in the emergency room over the last week for similar complaints.  Prior CT brain was negative.  Urine drug screen has been negative.  Patient is on oxycodone for chronic pain related to fibromyalgia.  The daughter, at bedside, reports increased confusion over the last week.  Patient is usually independent.  She lives alone and usually cares for her grandchildren during the week.  Patient has continued to take her  own medications independently.  However, over the last 48 hours patient has become more confused with increased memory deficit noticed by her family who feel like she is no longer able to care for herself at this time.  Patient tells me she has fallen once over the last week in her bedroom onto a "pile of clothes".  She denies head trauma or other injury.  She denies loss of consciousness at that time.  According to the patient, she has taken Aricept about 1 year ago for dementia.  She does not recall the name of the medication however this was discontinued as a result of adverse side effects (according to patient report).  The daughter reports no recent significant illness or hospitalization.  She was treated for acute bronchitis within the last 2 weeks with a course of azithromycin and steroid.  She denies fevers, chills, nausea, vomiting, diarrhea.  She denies abdominal pain.  She denies increased shortness of breath or chest pain.  She denies decreased vision or diplopia.  She denies unilateral weakness.  No prior expressive or receptive aphasia has been noticed.  No facial drooping or slurred speech.  She denies dysphagia.  She denies paresthesias.  Family has noticed no decreased appetite or decreased p.o. intake.  MRI brain is negative for acute findings.  Chest x-ray shows no acute pulmonary disease.  Troponin is less than 0.03.  Rapid COVID-19 testing has been negative.  We have admitted her to the hospitalist service for further management  PAST MEDICAL HISTORY:   Past Medical History:  Diagnosis Date  . Asthma   . Atypical chest pain    neg ETT at [facility]  . CKD (chronic kidney disease), stage III (  [place])   . Depression   . Diabetes mellitus without complication ([place])   . Fibromyalgia   . HLD (hyperlipidemia)   . Hypertension   . Vitamin D deficiency     PAST SURGICAL HISTORY:   Past Surgical History:  Procedure Laterality Date  . ABDOMINAL HYSTERECTOMY    . COLONOSCOPY WITH PROPOFOL  N/A 05/22/2016   Procedure: COLONOSCOPY WITH PROPOFOL;  Surgeon: Lollie Sails, MD;  Location: [facility] ENDOSCOPY;  Service: Endoscopy;  Laterality: N/A;  . ESOPHAGOGASTRODUODENOSCOPY (EGD) WITH PROPOFOL N/A 05/22/2016   Procedure: ESOPHAGOGASTRODUODENOSCOPY (EGD) WITH PROPOFOL;  Surgeon: Lollie Sails, MD;  Location: [facility] ENDOSCOPY;  Service: Endoscopy;  Laterality: N/A;  . HAND SURGERY Right 2014   Dr Rudene Christians  . rotator cuff surgery Left 03/2013   DR Rudene Christians  . SHOULDER ARTHROSCOPY WITH OPEN ROTATOR CUFF REPAIR Right 04/25/2017   Procedure: SHOULDER ARTHROSCOPY WITH OPEN ROTATOR CUFF REPAIR;  Surgeon: Corky Mull, MD;  Location: [facility] ORS;  Service: Orthopedics;  Laterality: Right;  . SHOULDER ARTHROSCOPY WITH OPEN ROTATOR CUFF REPAIR Right 03/11/2018   Procedure: SHOULDER ARTHROSCOPY WITH Recurrent  OPEN ROTATOR CUFF REPAIR;  Surgeon: Corky Mull, MD;  Location: [facility] ORS;  Service: Orthopedics;  Laterality: Right;  debridment and decompression    SOCIAL HISTORY:   Social History   Tobacco Use  . Smoking status: Never Smoker  . Smokeless tobacco: Never Used  Substance Use Topics  . Alcohol use: No    Alcohol/week: 0.0 standard drinks    FAMILY HISTORY:   Family History  Problem Relation Age of Onset  . Diabetes Mother   . Breast cancer Mother 94  . Cancer Sister   . Cancer Father   . Liver disease Brother   . Breast cancer Maternal Aunt   . Breast cancer Cousin        maternal    DRUG ALLERGIES:   Allergies  Allergen Reactions  . Ace Inhibitors Other (See Comments) and Cough    Constant persistent cough  . Pollen Extracts [Pollen Extract] Other (See Comments)    Sneezing/watery eyes/itchy eye  . Vicodin [Hydrocodone-Acetaminophen] Nausea And Vomiting    REVIEW OF SYSTEMS:   Review of Systems  Constitutional: Negative for chills, fever and malaise/fatigue.  HENT: Negative for congestion, hearing loss, sinus pain, sore throat and tinnitus.   Eyes: Negative for  blurred vision, double vision and pain.  Respiratory: Positive for shortness of breath. Negative for cough, sputum production and wheezing.   Cardiovascular: Negative for chest pain, palpitations and leg swelling.  Gastrointestinal: Negative for abdominal pain, constipation, diarrhea, heartburn, nausea and vomiting.  Genitourinary: Negative for dysuria, frequency and hematuria.  Musculoskeletal: Positive for falls (fell in her bedroom about 1 month ago with no trauma.  No LOC). Negative for myalgias.  Neurological: Positive for dizziness and headaches (Frontal "nagging"). Negative for tingling, tremors, speech change, focal weakness, seizures, loss of consciousness and weakness.  Psychiatric/Behavioral: Negative.       MEDICATIONS AT HOME:   Prior to Admission medications   Medication Sig Start Date End Date Taking? Authorizing Provider  acetaminophen (TYLENOL) 500 MG tablet Take 500-1,000 mg by mouth every 6 (six) hours as needed for mild pain or headache.     [provider]  albuterol (PROAIR HFA) 108 (90 BASE) MCG/ACT inhaler Inhale 2 puffs into the lungs every 6 (six) hours as needed for wheezing or shortness of breath.     [provider]  azithromycin (ZITHROMAX Z-PAK) 250 MG tablet  Take 2 tablets (500 mg) on  Day 1,  followed by 1 tablet (250 mg) once daily on Days 2 through 5. 07/26/18   Menshew, Dannielle Karvonen, PA-C  benzonatate (TESSALON PERLES) 100 MG capsule Take 1 capsule (100 mg total) by mouth every 6 (six) hours as needed for cough. 07/22/18 07/22/19  Harvest Dark, MD  Cholecalciferol (VITAMIN D3) 50 MCG (2000 UT) TABS Take 2,000 Units by mouth daily.    [provider]  diclofenac sodium (VOLTAREN) 1 % GEL Apply 2 g topically 2 (two) times daily as needed. Patient taking differently: Apply 2 g topically 2 (two) times daily as needed (for pain).  09/30/17   Merlyn Lot, MD  hydrochlorothiazide (HYDRODIURIL) 25 MG tablet Take 25 mg by mouth  daily.     [provider]  Insulin Glargine (LANTUS SOLOSTAR) 100 UNIT/ML Solostar Pen Inject 43 Units into the skin daily.    [provider]  isosorbide mononitrate (IMDUR) 30 MG 24 hr tablet Take 30 mg by mouth daily.    [provider]  loratadine (CLARITIN) 10 MG tablet Take 10 mg by mouth daily.    [provider]  losartan (COZAAR) 25 MG tablet Take 25 mg by mouth daily.    [provider]  nortriptyline (PAMELOR) 10 MG capsule Take 30 mg by mouth at bedtime.     [provider]  omeprazole (PRILOSEC) 20 MG capsule Take 20 mg by mouth daily.     [provider]  ondansetron (ZOFRAN ODT) 4 MG disintegrating tablet Take 1 tablet (4 mg total) by mouth every 8 (eight) hours as needed for nausea or vomiting. 03/11/18   Poggi, Marshall Cork, MD  oxyCODONE (ROXICODONE) 5 MG immediate release tablet Take 1-2 tablets (5-10 mg total) by mouth every 4 (four) hours as needed. 03/11/18   Poggi, Marshall Cork, MD  sitaGLIPtin (JANUVIA) 100 MG tablet Take 100 mg by mouth daily.    [provider]  SUMAtriptan (IMITREX) 100 MG tablet Take 100 mg by mouth every 2 (two) hours as needed for migraine. May repeat in 2 hours if headache persists or recurs.    [provider]  venlafaxine XR (EFFEXOR-XR) 150 MG 24 hr capsule Take 150 mg by mouth daily with breakfast.     [provider]      VITAL SIGNS:  Blood pressure 114/78, pulse 80, temperature 98.7 F (37.1 C), temperature source Oral, resp. rate 16, height 5\' 1"  (1.549 m), weight 72.6 kg, SpO2 98 %.  PHYSICAL EXAMINATION:  Physical Exam Constitutional:      General: She is not in acute distress.    Appearance: Normal appearance. She is normal weight. She is not ill-appearing or diaphoretic.  HENT:     Head: Normocephalic and atraumatic.     Right Ear: External ear normal.     Left Ear: External ear normal.     Nose: Nose normal. No congestion.     Mouth/Throat:     Mouth:  Mucous membranes are moist.     Pharynx: Oropharynx is clear.  Eyes:     General: No scleral icterus.    Extraocular Movements: Extraocular movements intact.     Conjunctiva/sclera: Conjunctivae normal.     Pupils: Pupils are equal, round, and reactive to light.  Neck:     Musculoskeletal: Normal range of motion and neck supple.  Cardiovascular:     Rate and Rhythm: Normal rate and regular rhythm.     Pulses: Normal pulses.  Heart sounds: Normal heart sounds. No murmur. No friction rub. No gallop.   Pulmonary:     Effort: Pulmonary effort is normal. No respiratory distress.     Breath sounds: Normal breath sounds. No wheezing, rhonchi or rales.  Abdominal:     General: Abdomen is flat. Bowel sounds are normal. There is no distension.     Palpations: Abdomen is soft. There is no mass.     Tenderness: There is no abdominal tenderness.     Hernia: No hernia is present.  Musculoskeletal: Normal range of motion.        General: No swelling or tenderness.     Right lower leg: No edema.     Left lower leg: No edema.  Lymphadenopathy:     Cervical: No cervical adenopathy.  Skin:    General: Skin is warm and dry.     Capillary Refill: Capillary refill takes less than 2 seconds.     Coloration: Skin is not jaundiced.     Findings: No rash.  Neurological:     Mental Status: She is alert. She is disoriented.     Cranial Nerves: No cranial nerve deficit.     Sensory: No sensory deficit.     Motor: No weakness.     Comments: Oriented to person and place only  Psychiatric:        Mood and Affect: Mood normal.       LABORATORY PANEL:   CBC Recent Labs  Lab 07/29/18 1530  WBC 9.4  HGB 13.9  HCT 42.9  PLT 291   ------------------------------------------------------------------------------------------------------------------  Chemistries  Recent Labs  Lab 07/29/18 1530  NA 138  K 3.6  CL 101  CO2 28  GLUCOSE 206*  BUN 19  CREATININE 1.37*  CALCIUM 8.9  AST 19   ALT 17  ALKPHOS 88  BILITOT 0.4   ------------------------------------------------------------------------------------------------------------------  Cardiac Enzymes Recent Labs  Lab 07/26/18 1350  TROPONINI <0.03   ------------------------------------------------------------------------------------------------------------------  RADIOLOGY:  Ct Head Wo Contrast  Result Date: 07/29/2018 CLINICAL DATA:  Headache, dizziness. EXAM: CT HEAD WITHOUT CONTRAST TECHNIQUE: Contiguous axial images were obtained from the base of the skull through the vertex without intravenous contrast. COMPARISON:  CT scan of January 05, 2015. FINDINGS: Brain: Mild chronic ischemic white matter disease is noted. No mass effect or midline shift is noted. Ventricular size is within normal limits. There is no evidence of mass lesion, hemorrhage or acute infarction. Vascular: No hyperdense vessel or unexpected calcification. Skull: Normal. Negative for fracture or focal lesion. Sinuses/Orbits: No acute finding. Other: None. IMPRESSION: Mild chronic ischemic white matter disease. No acute intracranial abnormality seen. Electronically Signed   By: Marijo Conception M.D.   On: 07/29/2018 16:01   Mr Brain Wo Contrast  Result Date: 07/29/2018 CLINICAL DATA:  Initial evaluation for acute encephalopathy, increased forgetfulness, headache, dizziness. EXAM: MRI HEAD WITHOUT CONTRAST TECHNIQUE: Multiplanar, multiecho pulse sequences of the brain and surrounding structures were obtained without intravenous contrast. COMPARISON:  Prior CT from earlier same day. FINDINGS: Brain: Cerebral volume within normal limits for patient age. Scattered patchy T2/FLAIR hyperintensity within the periventricular and deep white matter both cerebral hemispheres most consistent with chronic small vessel ischemic disease, mild in nature. No abnormal foci of restricted diffusion to suggest acute or subacute ischemia. Gray-white matter differentiation well  maintained. No encephalomalacia to suggest chronic infarction. No foci of susceptibility artifact to suggest acute or chronic intracranial hemorrhage. No mass lesion, midline shift or mass effect. No hydrocephalus. No extra-axial  fluid collection. Major dural sinuses are grossly patent. Note made of a partially empty sella. Midline structures intact and normal. Vascular: Major intracranial vascular flow voids well maintained and normal in appearance. Skull and upper cervical spine: Craniocervical junction normal. Multilevel degenerative spondylolysis noted within the upper cervical spine without significant stenosis. Bone marrow signal intensity normal. No scalp soft tissue abnormality. Sinuses/Orbits: Globes and orbital soft tissues within normal limits. Paranasal sinuses are clear. No mastoid effusion. Inner ear structures normal. Other: None. IMPRESSION: 1. No acute intracranial abnormality identified. No findings to explain patient's symptoms. 2. Generalized age appropriate cerebral volume loss with mild chronic small vessel ischemic disease. Electronically Signed   By: Jeannine Boga M.D.   On: 07/29/2018 19:41      IMPRESSION AND PLAN:   1.  Altered mental status - CT and MRI brain are negative - We will get carotid Doppler studies in the a.m. - Neurology consulted for further evaluation and recommendations as patient has taken Aricept in the past in 2017 according to prior records.  - Continue neuro checks - Urine drug screen is pending.  Will review results when available with a possibility of overmedicating. - Patient's son is bringing her medications for review from home.  She has been taking these "on her own "  2. asthma - DuoNebs every 6 hours as needed for shortness of breath or wheezing - No evidence of exacerbation on exam  3.  Diabetes mellitus type 2 - Hemoglobin A1c pending.  Most recent hemoglobin A1c was 11 - Lantus and Januvia continued. - Moderate sliding scale  insulin  4.  Hypertension - Cozaar and HCTZ restarted - We will treat persistent hypertension expectantly  5.  Fibromyalgia - Will treat pain expectantly  6.  CKD stage III - Current BUN is 19 with creatinine 1.37. - We will continue to monitor renal function  DVT and PPI prophylaxis were initiated  All the records are reviewed and case discussed with ED provider. The plan of care was discussed in details with the patient (and family). I answered all questions. The patient agreed to proceed with the above mentioned plan. Further management will depend upon hospital course.   CODE STATUS: Full code  TOTAL TIME TAKING CARE OF THIS PATIENT: 45 minutes.    [place] on 07/29/2018 at 9:32 PM  Pager - [phone]  After 6pm go to [email or web address] - [employer] [facility]  Office  [phone]  CC: Primary care physician; [facility]   Note: This dictation was prepared with Dragon dictation along with smaller phrase technology. Any transcriptional errors that result from this process are unintentional.

## 2018-07-29 NOTE — ED Notes (Addendum)
ED TO INPATIENT HANDOFF REPORT  ED Nurse Name and Phone #: Dionisio David Name/Age/Gender Theresa Booker 73 y.o. female Room/Bed: ED14A/ED14A  Code Status   Code Status: Prior  Home/SNF/Other Home Patient oriented to: self, place and time Is this baseline? Yes   Pt able to answer orientation questions but quickly becomes confused and forgets previous answer/questioning.  Triage Complete: Triage complete  Chief Complaint confusion  Triage Note Pt is here with daughter n law who states this is the pt 3rd visit in the past week and is concerned the pt is being forgetful, not remember she just answered a question and ask again. Pt c/o HA with dizziness. Pt is a pour historian. Pt states she is walking to the side, "I cant walk straight"..pt doesn't remember eating today. Pt c/o intermittent left arm numbness. Pt is oriented to place and self, not to time.   Allergies Allergies  Allergen Reactions  . Ace Inhibitors Other (See Comments) and Cough    Constant persistent cough  . Pollen Extracts [Pollen Extract] Other (See Comments)    Sneezing/watery eyes/itchy eye  . Vicodin [Hydrocodone-Acetaminophen] Nausea And Vomiting    Level of Care/Admitting Diagnosis ED Disposition    ED Disposition Condition Comment   Admit  The patient appears reasonably stabilized for admission considering the current resources, flow, and capabilities available in the ED at this time, and I doubt any other [facility] requiring further screening and/or treatment in the ED prior to admission is  present.       B Medical/Surgery History Past Medical History:  Diagnosis Date  . Asthma   . Atypical chest pain    neg ETT at [facility]  . CKD (chronic kidney disease), stage III ([place])   . Depression   . Diabetes mellitus without complication ([place])   . Fibromyalgia   . HLD (hyperlipidemia)   . Hypertension   . Vitamin D deficiency    Past Surgical History:  Procedure Laterality Date  . ABDOMINAL HYSTERECTOMY     . COLONOSCOPY WITH PROPOFOL N/A 05/22/2016   Procedure: COLONOSCOPY WITH PROPOFOL;  Surgeon: Lollie Sails, MD;  Location: [facility] ENDOSCOPY;  Service: Endoscopy;  Laterality: N/A;  . ESOPHAGOGASTRODUODENOSCOPY (EGD) WITH PROPOFOL N/A 05/22/2016   Procedure: ESOPHAGOGASTRODUODENOSCOPY (EGD) WITH PROPOFOL;  Surgeon: Lollie Sails, MD;  Location: [facility] ENDOSCOPY;  Service: Endoscopy;  Laterality: N/A;  . HAND SURGERY Right 2014   Dr Rudene Christians  . rotator cuff surgery Left 03/2013   DR Rudene Christians  . SHOULDER ARTHROSCOPY WITH OPEN ROTATOR CUFF REPAIR Right 04/25/2017   Procedure: SHOULDER ARTHROSCOPY WITH OPEN ROTATOR CUFF REPAIR;  Surgeon: Corky Mull, MD;  Location: [facility] ORS;  Service: Orthopedics;  Laterality: Right;  . SHOULDER ARTHROSCOPY WITH OPEN ROTATOR CUFF REPAIR Right 03/11/2018   Procedure: SHOULDER ARTHROSCOPY WITH Recurrent  OPEN ROTATOR CUFF REPAIR;  Surgeon: Corky Mull, MD;  Location: [facility] ORS;  Service: Orthopedics;  Laterality: Right;  debridment and decompression     A IV Location/Drains/Wounds Patient Lines/Drains/Airways Status   Active Line/Drains/Airways    Name:   Placement date:   Placement time:   Site:   Days:   Peripheral IV 07/26/18 Left Antecubital   07/26/18    1355    Antecubital   3   Peripheral IV 07/29/18 Right Antecubital   07/29/18    2025    Antecubital   less than 1   Incision (Closed) 04/25/17 Shoulder Right   04/25/17    1435  460   Incision (Closed) 03/11/18 Shoulder Right   03/11/18    0934     140          Intake/Output Last 24 hours No intake or output data in the 24 hours ending 07/29/18 2052  Labs/Imaging Results for orders placed or performed during the hospital encounter of 07/29/18 (from the past 48 hour(s))  Glucose, capillary     Status: Abnormal   Collection Time: 07/29/18  3:24 PM  Result Value Ref Range   Glucose-Capillary 175 (H) 70 - 99 mg/dL  Protime-INR     Status: None   Collection Time: 07/29/18  3:30 PM  Result Value Ref  Range   Prothrombin Time 14.0 11.4 - 15.2 seconds   INR 1.1 0.8 - 1.2    Comment: (NOTE) INR goal varies based on device and disease states. Performed at [facility], [address] [place], [place] [address]   APTT     Status: None   Collection Time: 07/29/18  3:30 PM  Result Value Ref Range   aPTT 28 24 - 36 seconds    Comment: Performed at [facility], [address] [place], [place] [address]  CBC     Status: None   Collection Time: 07/29/18  3:30 PM  Result Value Ref Range   WBC 9.4 4.0 - 10.5 K/uL   RBC 4.95 3.87 - 5.11 MIL/uL   Hemoglobin 13.9 12.0 - 15.0 g/dL   HCT 42.9 36.0 - 46.0 %   MCV 86.7 80.0 - 100.0 fL   MCH 28.1 26.0 - 34.0 pg   MCHC 32.4 30.0 - 36.0 g/dL   RDW 13.7 11.5 - 15.5 %   Platelets 291 150 - 400 K/uL   nRBC 0.0 0.0 - 0.2 %    Comment: Performed at [facility], [address] [place], [place] [address]  Differential     Status: None   Collection Time: 07/29/18  3:30 PM  Result Value Ref Range   Neutrophils Relative % 53 %   Neutro Abs 5.0 1.7 - 7.7 K/uL   Lymphocytes Relative 35 %   Lymphs Abs 3.3 0.7 - 4.0 K/uL   Monocytes Relative 9 %   Monocytes Absolute 0.8 0.1 - 1.0 K/uL   Eosinophils Relative 2 %   Eosinophils Absolute 0.2 0.0 - 0.5 K/uL   Basophils Relative 1 %   Basophils Absolute 0.1 0.0 - 0.1 K/uL   Immature Granulocytes 0 %   Abs Immature Granulocytes 0.04 0.00 - 0.07 K/uL    Comment: Performed at [facility], [address] [place], [place] [address]  Comprehensive metabolic panel     Status: Abnormal   Collection Time: 07/29/18  3:30 PM  Result Value Ref Range   Sodium 138 135 - 145 mmol/L   Potassium 3.6 3.5 - 5.1 mmol/L   Chloride 101 98 - 111 mmol/L   CO2 28 22 - 32 mmol/L   Glucose, Bld 206 (H) 70 - 99 mg/dL   BUN 19 8 - 23 mg/dL   Creatinine, Ser 1.37 (H) 0.44 - 1.00 mg/dL   Calcium 8.9 8.9 - 10.3 mg/dL   Total Protein 7.8 6.5 - 8.1 g/dL   Albumin 3.7 3.5 - 5.0 g/dL    AST 19 15 - 41 U/L   ALT 17 0 - 44 U/L   Alkaline Phosphatase 88 38 - 126 U/L   Total Bilirubin 0.4 0.3 - 1.2 mg/dL   GFR calc non Af  Amer 38 (L) >60 mL/min   GFR calc Af Amer 44 (L) >60 mL/min   Anion gap 9 5 - 15    Comment: Performed at [facility], [address] [place], [place] [address]   Ct Head Wo Contrast  Result Date: 07/29/2018 CLINICAL DATA:  Headache, dizziness. EXAM: CT HEAD WITHOUT CONTRAST TECHNIQUE: Contiguous axial images were obtained from the base of the skull through the vertex without intravenous contrast. COMPARISON:  CT scan of January 05, 2015. FINDINGS: Brain: Mild chronic ischemic white matter disease is noted. No mass effect or midline shift is noted. Ventricular size is within normal limits. There is no evidence of mass lesion, hemorrhage or acute infarction. Vascular: No hyperdense vessel or unexpected calcification. Skull: Normal. Negative for fracture or focal lesion. Sinuses/Orbits: No acute finding. Other: None. IMPRESSION: Mild chronic ischemic white matter disease. No acute intracranial abnormality seen. Electronically Signed   By: Marijo Conception M.D.   On: 07/29/2018 16:01   Mr Brain Wo Contrast  Result Date: 07/29/2018 CLINICAL DATA:  Initial evaluation for acute encephalopathy, increased forgetfulness, headache, dizziness. EXAM: MRI HEAD WITHOUT CONTRAST TECHNIQUE: Multiplanar, multiecho pulse sequences of the brain and surrounding structures were obtained without intravenous contrast. COMPARISON:  Prior CT from earlier same day. FINDINGS: Brain: Cerebral volume within normal limits for patient age. Scattered patchy T2/FLAIR hyperintensity within the periventricular and deep white matter both cerebral hemispheres most consistent with chronic small vessel ischemic disease, mild in nature. No abnormal foci of restricted diffusion to suggest acute or subacute ischemia. Gray-white matter differentiation well maintained. No encephalomalacia to suggest  chronic infarction. No foci of susceptibility artifact to suggest acute or chronic intracranial hemorrhage. No mass lesion, midline shift or mass effect. No hydrocephalus. No extra-axial fluid collection. Major dural sinuses are grossly patent. Note made of a partially empty sella. Midline structures intact and normal. Vascular: Major intracranial vascular flow voids well maintained and normal in appearance. Skull and upper cervical spine: Craniocervical junction normal. Multilevel degenerative spondylolysis noted within the upper cervical spine without significant stenosis. Bone marrow signal intensity normal. No scalp soft tissue abnormality. Sinuses/Orbits: Globes and orbital soft tissues within normal limits. Paranasal sinuses are clear. No mastoid effusion. Inner ear structures normal. Other: None. IMPRESSION: 1. No acute intracranial abnormality identified. No findings to explain patient's symptoms. 2. Generalized age appropriate cerebral volume loss with mild chronic small vessel ischemic disease. Electronically Signed   By: Jeannine Boga M.D.   On: 07/29/2018 19:41    Pending Labs Unresulted Labs (From admission, onward)    Start     Ordered   07/29/18 1818  Urinalysis, Complete w Microscopic  Once,   STAT     07/29/18 1817          Vitals/Pain Today's Vitals   07/29/18 1515 07/29/18 1808 07/29/18 1900 07/29/18 2016  BP:  (!) 149/109 (!) 147/99 (!) 141/99  Pulse:  84 84 79  Resp:  16 18 16   Temp:      TempSrc:      SpO2:  98% 98% 98%  Weight: 72.6 kg     Height: 5\' 1"  (1.549 m)       Isolation Precautions No active isolations  Medications Medications - No data to display  Mobility walks with person assist High fall risk   Focused Assessments Neuro Assessment Handoff:  Swallow screen pass? Yes  Cardiac Rhythm: Normal sinus rhythm NIH Stroke Scale ( + Modified Stroke Scale Criteria)  Interval: Initial Level of  Consciousness (1a.)   : Alert, keenly  responsive LOC Questions (1b. )   +: Answers both questions correctly LOC Commands (1c. )   + : Performs both tasks correctly Best Gaze (2. )  +: Normal Visual (3. )  +: No visual loss Facial Palsy (4. )    : Normal symmetrical movements Motor Arm, Left (5a. )   +: No drift Motor Arm, Right (5b. )   +: No drift Motor Leg, Left (6a. )   +: No drift Motor Leg, Right (6b. )   +: No drift Limb Ataxia (7. ): Absent Sensory (8. )   +: Normal, no sensory loss Best Language (9. )   +: No aphasia Dysarthria (10. ): Normal Extinction/Inattention (11.)   +: No Abnormality Modified SS Total  +: 0 Complete NIHSS TOTAL: 0     Neuro Assessment:   Neuro Checks:   Initial (07/29/18 2026)  Last Documented NIHSS Modified Score: 0 (07/29/18 2026) Has TPA been given? No If patient is a Neuro Trauma and patient is going to OR before floor call report to [place] nurse: [phone] or [phone]     R Recommendations: See Admitting Provider Note  Report given to:   Additional Notes:

## 2018-07-29 NOTE — ED Provider Notes (Signed)
[facility] Emergency Department Provider Note  Time seen: 6:19 PM  I have reviewed the triage vital signs and the nursing notes.   HISTORY  Chief Complaint Altered Mental Status   HPI Theresa Booker is a 73 y.o. female with a past medical history of asthma, CKD, diabetes, fibromyalgia, hypertension, hyperlipidemia, presents to the emergency department for confusion.  According to the daughter over the past 1 week the patient has been complaining of headaches and now significant confusion over the past 2 to 3 days.  Patient has been seen 3 times in the emergency department over the past 1 week for the same, but now with confusion.  Daughter states over the past 2 days the patient has been unable to communicate as well, has been very forgetful, will forget what she is saying mid sentence.  She states 3 days ago the patient was 100% normal.  Patient lives alone and cares for self, watches the grandchildren, and over the past 24 to 48 hours and is unable to adequately care for herself and the son has had to live with the patient to help her at home.  Here the patient is awake and alert, she has no distress but she is disoriented to time.  When asked questions she will forget what she is answering when she is attempting to answer the question.  Patient was unable to read a clock, unable to tell me the year.  Daughter states this is extremely abnormal and 2 days ago the patient with had no problem with any of these questions.  No fever, cough congestion or shortness of breath.    Past Medical History:  Diagnosis Date  . Asthma   . Atypical chest pain    neg ETT at [facility]  . CKD (chronic kidney disease), stage III ([place])   . Depression   . Diabetes mellitus without complication ([place])   . Fibromyalgia   . HLD (hyperlipidemia)   . Hypertension   . Vitamin D deficiency     Patient Active Problem List   Diagnosis Date Noted  . Cocaine use 05/03/2015  . Abnormal MRI, lumbar  spine 05/03/2015  . Numbness in feet 04/29/2015  . Gait instability 04/29/2015  . BP (high blood pressure) 04/13/2015  . Fibromyalgia 04/13/2015  . Essential (primary) hypertension 04/13/2015  . Clinical depression 04/13/2015  . Chronic kidney disease (CKD), stage III (moderate) ([place]) 04/13/2015  . Airway hyperreactivity 04/13/2015  . Chronic knee pain (Right) 04/13/2015  . Chronic low back pain (Location of Secondary source of pain) (Bilateral) (R>L) 04/13/2015  . Chronic lower straight pain (Location of Primary Source of Pain) (Right) 04/13/2015  . Chronic lumbar radicular pain (Right) (L5 Dermatome) 04/13/2015  . At high risk for falls 04/13/2015  . Chronic hip pain (Right) 04/13/2015  . Chronic sacroiliac joint pain (Bilateral) (R>L) 04/13/2015  . Controlled type 2 diabetes mellitus without complication ([place]) 32/67/1245  . Headache disorder 02/01/2015  . Dizziness 02/01/2015  . Bad posture 02/01/2015  . Episodic tension type headache 01/10/2015  . Amnesia 10/18/2014  . 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 Acid reflux 09/24/2013  . Chronic pain 09/24/2013  . Avitaminosis D 02/11/2013    Past Surgical History:  Procedure Laterality Date  . ABDOMINAL HYSTERECTOMY    . COLONOSCOPY WITH PROPOFOL N/A 05/22/2016   Procedure: COLONOSCOPY WITH PROPOFOL;  Surgeon: Lollie Sails, MD;  Location: [facility] ENDOSCOPY;  Service: Endoscopy;  Laterality: N/A;  . ESOPHAGOGASTRODUODENOSCOPY (EGD) WITH PROPOFOL  N/A 05/22/2016   Procedure: ESOPHAGOGASTRODUODENOSCOPY (EGD) WITH PROPOFOL;  Surgeon: Lollie Sails, MD;  Location: [facility] ENDOSCOPY;  Service: Endoscopy;  Laterality: N/A;  . HAND SURGERY Right 2014   Dr Rudene Christians  . rotator cuff surgery Left 03/2013   DR Rudene Christians  . SHOULDER ARTHROSCOPY WITH OPEN ROTATOR CUFF REPAIR Right 04/25/2017   Procedure: SHOULDER ARTHROSCOPY WITH OPEN ROTATOR CUFF REPAIR;  Surgeon: Corky Mull, MD;  Location: [facility] ORS;  Service:  Orthopedics;  Laterality: Right;  . SHOULDER ARTHROSCOPY WITH OPEN ROTATOR CUFF REPAIR Right 03/11/2018   Procedure: SHOULDER ARTHROSCOPY WITH Recurrent  OPEN ROTATOR CUFF REPAIR;  Surgeon: Corky Mull, MD;  Location: [facility] ORS;  Service: Orthopedics;  Laterality: Right;  debridment and decompression    Prior to Admission medications   Medication Sig Start Date End Date Taking? Authorizing Provider  acetaminophen (TYLENOL) 500 MG tablet Take 500-1,000 mg by mouth every 6 (six) hours as needed for mild pain or headache.     [provider]  albuterol (PROAIR HFA) 108 (90 BASE) MCG/ACT inhaler Inhale 2 puffs into the lungs every 6 (six) hours as needed for wheezing or shortness of breath.     [provider]  azithromycin (ZITHROMAX Z-PAK) 250 MG tablet Take 2 tablets (500 mg) on  Day 1,  followed by 1 tablet (250 mg) once daily on Days 2 through 5. 07/26/18   Menshew, Dannielle Karvonen, PA-C  benzonatate (TESSALON PERLES) 100 MG capsule Take 1 capsule (100 mg total) by mouth every 6 (six) hours as needed for cough. 07/22/18 07/22/19  Harvest Dark, MD  Cholecalciferol (VITAMIN D3) 50 MCG (2000 UT) TABS Take 2,000 Units by mouth daily.    [provider]  diclofenac sodium (VOLTAREN) 1 % GEL Apply 2 g topically 2 (two) times daily as needed. Patient taking differently: Apply 2 g topically 2 (two) times daily as needed (for pain).  09/30/17   Merlyn Lot, MD  hydrochlorothiazide (HYDRODIURIL) 25 MG tablet Take 25 mg by mouth daily.     [provider]  Insulin Glargine (LANTUS SOLOSTAR) 100 UNIT/ML Solostar Pen Inject 43 Units into the skin daily.    [provider]  isosorbide mononitrate (IMDUR) 30 MG 24 hr tablet Take 30 mg by mouth daily.    [provider]  loratadine (CLARITIN) 10 MG tablet Take 10 mg by mouth daily.    [provider]  losartan (COZAAR) 25 MG tablet Take 25 mg by mouth daily.    [provider]   nortriptyline (PAMELOR) 10 MG capsule Take 30 mg by mouth at bedtime.     [provider]  omeprazole (PRILOSEC) 20 MG capsule Take 20 mg by mouth daily.     [provider]  ondansetron (ZOFRAN ODT) 4 MG disintegrating tablet Take 1 tablet (4 mg total) by mouth every 8 (eight) hours as needed for nausea or vomiting. 03/11/18   Poggi, Marshall Cork, MD  oxyCODONE (ROXICODONE) 5 MG immediate release tablet Take 1-2 tablets (5-10 mg total) by mouth every 4 (four) hours as needed. 03/11/18   Poggi, Marshall Cork, MD  sitaGLIPtin (JANUVIA) 100 MG tablet Take 100 mg by mouth daily.    [provider]  SUMAtriptan (IMITREX) 100 MG tablet Take 100 mg by mouth every 2 (two) hours as needed for migraine. May repeat in 2 hours if headache persists or recurs.    [provider]  venlafaxine XR (EFFEXOR-XR) 150 MG 24 hr capsule Take 150 mg  by mouth daily with breakfast.     [provider]    Allergies  Allergen Reactions  . Ace Inhibitors Other (See Comments) and Cough    Constant persistent cough  . Pollen Extracts [Pollen Extract] Other (See Comments)    Sneezing/watery eyes/itchy eye  . Vicodin [Hydrocodone-Acetaminophen] Nausea And Vomiting    Family History  Problem Relation Age of Onset  . Diabetes Mother   . Breast cancer Mother 65  . Cancer Sister   . Cancer Father   . Liver disease Brother   . Breast cancer Maternal Aunt   . Breast cancer Cousin        maternal    Social History Social History   Tobacco Use  . Smoking status: Never Smoker  . Smokeless tobacco: Never Used  Substance Use Topics  . Alcohol use: No    Alcohol/week: 0.0 standard drinks  . Drug use: No    Review of Systems per patient and daughter. Constitutional: Negative for fever. ENT: Negative for recent illness/congestion Cardiovascular: Negative for chest pain. Respiratory: Negative for shortness of breath.  For cough. Gastrointestinal: Negative for abdominal pain,  vomiting Genitourinary: Negative for urinary compaints Musculoskeletal: Negative for musculoskeletal complaints Skin: Negative for skin complaints  Neurological: Headaches x1 week.  Now with confusion.  No weakness or numbness. All other ROS negative  ____________________________________________   PHYSICAL EXAM:  VITAL SIGNS: ED Triage Vitals  Enc Vitals Group     BP 07/29/18 1511 (!) 138/94     Pulse Rate 07/29/18 1511 95     Resp 07/29/18 1511 18     Temp 07/29/18 1511 98.7 F (37.1 C)     Temp Source 07/29/18 1511 Oral     SpO2 07/29/18 1511 97 %     Weight 07/29/18 1515 160 lb (72.6 kg)     Height 07/29/18 1515 5\' 1"  (1.549 m)     Head Circumference --      Peak Flow --      Pain Score --      Pain Loc --      Pain Edu? --      Excl. in [place]? --    Constitutional: Awake and alert, oriented to person and place but not time. Eyes: Normal exam ENT      Head: Normocephalic and atraumatic.      Mouth/Throat: Mucous membranes are moist. Cardiovascular: Normal rate, regular rhythm.  Respiratory: Normal respiratory effort without tachypnea nor retractions. Breath sounds are clear Gastrointestinal: Soft and nontender. No distention.  Musculoskeletal: Nontender with normal range of motion in all extremities.  Neurologic:  Normal speech and language. No gross focal neurologic deficits.  Equal grip strengths.  No pronator drift.  No lower extremity drift.  Cranial nerves intact. Skin:  Skin is warm, dry and intact.  Psychiatric: Mood and affect are normal.   ____________________________________________    EKG  EKG viewed and interpreted by myself shows a normal sinus rhythm at 92 bpm with a slightly widened QRS, normal axis, normal intervals nonspecific but no concerning ST changes.  ____________________________________________    RADIOLOGY  CT scan is negative MRI is negative.  ____________________________________________   INITIAL IMPRESSION / ASSESSMENT AND PLAN  / ED COURSE  Pertinent labs & imaging results that were available during my care of the patient were reviewed by me and considered in my medical decision making (see chart for details).   Patient presents to the emergency department for confusion over the past 2 days along  with a headache x1 week.  Patient has no concerning findings on neurologic exam besides the fact that she cannot read a clock, cannot tell me the year, would oftentimes forget the question she was answering while answering the question.  Daughter states this is extremely abnormal.  Differential would include CVA, metabolic abnormality, infectious etiology.  We will check labs, urinalysis.  CT scan of the head is negative, we will proceed with MRI given the patient's acute symptoms.  Patient's MRI is negative.  Patient continues to be very confused.  Patient has been in our emergency department now 3 times over the past 1 week and [facility] once for similar symptoms.  We will discuss with the hospitalist for possible admission to the hospital for further work-up.  ILIANA HUTT was evaluated in Emergency Department on 07/29/2018 for the symptoms described in the history of present illness. She was evaluated in the context of the global COVID-19 pandemic, which necessitated consideration that the patient might be at risk for infection with the SARS-CoV-2 virus that causes COVID-19. Institutional protocols and algorithms that pertain to the evaluation of patients at risk for COVID-19 are in a state of rapid change based on information released by regulatory bodies including the CDC and federal and state organizations. These policies and algorithms were followed during the patient's care in the ED.  ____________________________________________   FINAL CLINICAL IMPRESSION(S) / ED DIAGNOSES  Confusion Altered mental status   Harvest Dark, MD 07/29/18 2043

## 2018-07-29 NOTE — ED Triage Notes (Addendum)
Pt is here with daughter n law who states this is the pt 3rd visit in the past week and is concerned the pt is being forgetful, not remember she just answered a question and ask again. Pt c/o HA with dizziness. Pt is a pour historian. Pt states she is walking to the side, "I cant walk straight"..pt doesn't remember eating today. Pt c/o intermittent left arm numbness. Pt is oriented to place and self, not to time.

## 2018-07-30 ENCOUNTER — Inpatient Hospital Stay: Payer: Medicare Other

## 2018-07-30 DIAGNOSIS — R51 Headache: Secondary | ICD-10-CM | POA: Diagnosis not present

## 2018-07-30 DIAGNOSIS — R4182 Altered mental status, unspecified: Secondary | ICD-10-CM | POA: Diagnosis not present

## 2018-07-30 LAB — BASIC METABOLIC PANEL
Anion gap: 7 (ref 5–15)
BUN: 20 mg/dL (ref 8–23)
CO2: 28 mmol/L (ref 22–32)
Calcium: 8.2 mg/dL — ABNORMAL LOW (ref 8.9–10.3)
Chloride: 102 mmol/L (ref 98–111)
Creatinine, Ser: 1.53 mg/dL — ABNORMAL HIGH (ref 0.44–1.00)
GFR calc Af Amer: 38 mL/min — ABNORMAL LOW (ref >60)
GFR calc non Af Amer: 33 mL/min — ABNORMAL LOW (ref >60)
Glucose, Bld: 304 mg/dL — ABNORMAL HIGH (ref 70–99)
Potassium: 3.7 mmol/L (ref 3.5–5.1)
Sodium: 137 mmol/L (ref 135–145)

## 2018-07-30 LAB — CBC
HCT: 39.3 % (ref 36.0–46.0)
Hemoglobin: 12.8 g/dL (ref 12.0–15.0)
MCH: 28.6 pg (ref 26.0–34.0)
MCHC: 32.6 g/dL (ref 30.0–36.0)
MCV: 87.9 fL (ref 80.0–100.0)
Platelets: 276 10*3/uL (ref 150–400)
RBC: 4.47 MIL/uL (ref 3.87–5.11)
RDW: 13.8 % (ref 11.5–15.5)
WBC: 9.1 10*3/uL (ref 4.0–10.5)
nRBC: 0 % (ref 0.0–0.2)

## 2018-07-30 LAB — HEMOGLOBIN A1C
Hgb A1c MFr Bld: 8.1 % — ABNORMAL HIGH (ref 4.8–5.6)
Mean Plasma Glucose: 185.77 mg/dL

## 2018-07-30 LAB — GLUCOSE, CAPILLARY
Glucose-Capillary: 263 mg/dL — ABNORMAL HIGH (ref 70–99)
Glucose-Capillary: 185 mg/dL — ABNORMAL HIGH (ref 70–99)
Glucose-Capillary: 235 mg/dL — ABNORMAL HIGH (ref 70–99)
Glucose-Capillary: 258 mg/dL — ABNORMAL HIGH (ref 70–99)
Glucose-Capillary: 212 mg/dL — ABNORMAL HIGH (ref 70–99)

## 2018-07-30 LAB — TSH: TSH: 1.01 u[IU]/mL (ref 0.350–4.500)

## 2018-07-30 MED ORDER — ENOXAPARIN SODIUM 30 MG/0.3ML ~~LOC~~ SOLN
30.0000 mg | SUBCUTANEOUS | Status: DC
  Administered 2018-07-30: 21:00:00 30 mg via SUBCUTANEOUS
  Filled 2018-07-30: qty 0.3

## 2018-07-30 MED ORDER — ASPIRIN EC 81 MG PO TBEC: 81.0000 mg | DELAYED_RELEASE_TABLET | Freq: Every day | ORAL | 0 refills | Status: AC

## 2018-07-30 MED ORDER — ACETAMINOPHEN 325 MG PO TABS
650.0000 mg | ORAL_TABLET | Freq: Four times a day (QID) | ORAL | Status: DC | PRN
  Administered 2018-07-30 (×2): 650 mg via ORAL
  Filled 2018-07-30 (×2): qty 2

## 2018-07-30 MED ORDER — ADULT MULTIVITAMIN W/MINERALS CH: 1.0000 | ORAL_TABLET | Freq: Every day | ORAL | 0 refills | Status: AC

## 2018-07-30 NOTE — Discharge Summary (Addendum)
[facility] [place] NAME: Theresa Booker    MR#:  [id number]  DATE OF BIRTH:  03-Apr-1945  DATE OF ADMISSION:  07/29/2018 ADMITTING PHYSICIAN: Gladstone Lighter, MD  DATE OF DISCHARGE: No discharge date for patient encounter.  PRIMARY CARE PHYSICIAN: Center, [facility]    ADMISSION DIAGNOSIS:  Headache [R51] Altered mental status, unspecified altered mental status type [R41.82]  DISCHARGE DIAGNOSIS:  Active Problems:   Altered mental status   SECONDARY DIAGNOSIS:   Past Medical History:  Diagnosis Date  . Asthma   . Atypical chest pain    neg ETT at [facility]  . CKD (chronic kidney disease), stage III ([place])   . Depression   . Diabetes mellitus without complication ([place])   . Fibromyalgia   . HLD (hyperlipidemia)   . Hypertension   . Vitamin D deficiency     HOSPITAL COURSE:  * Altered mental status ?  Suspected due to medication side effect/opioid use versus possible mild dementia Resolved Referred to the observation unit on our TIA/CVA protocol, MRI negative for stroke, carotid Dopplers negative for any hemodynamically significant stenosis, neurology consulted while in house-okay for discharge from their standpoint/we will arrange for outpatient follow-up given history of chronic intermittent episodes of confusion, per family-patient has been seen several times at other hospital facilities without a clear etiology, the family denies any history of dementia-noted Aricept use in the past in 2017 according to prior records  * asthma without exacerbation Provided breathing treatments PRN  * Diabetes mellitus type 2 Controlled on current regiment  * Hypertension Stable on Cozaar and HCTZ  *  Fibromyalgia Stable  * CKD stage III Stable Secondary to diabetes  DISCHARGE CONDITIONS:   stable  CONSULTS OBTAINED:  Treatment Team:  Gorden Harms, MD  DRUG ALLERGIES:   Allergies  Allergen Reactions   . Ace Inhibitors Other (See Comments) and Cough    Constant persistent cough  . Pollen Extracts [Pollen Extract] Other (See Comments)    Sneezing/watery eyes/itchy eye  . Vicodin [Hydrocodone-Acetaminophen] Nausea And Vomiting    DISCHARGE MEDICATIONS:   Allergies as of 07/30/2018      Reactions   Ace Inhibitors Other (See Comments), Cough   Constant persistent cough   Pollen Extracts [pollen Extract] Other (See Comments)   Sneezing/watery eyes/itchy eye   Vicodin [hydrocodone-acetaminophen] Nausea And Vomiting      Medication List    STOP taking these medications   azithromycin 250 MG tablet Commonly known as:  Zithromax Z-Pak     TAKE these medications   acetaminophen 500 MG tablet Commonly known as:  TYLENOL Take 500-1,000 mg by mouth every 6 (six) hours as needed for mild pain or headache.   aspirin EC 81 MG tablet Take 1 tablet (81 mg total) by mouth daily.   benzonatate 100 MG capsule Commonly known as:  Tessalon Perles Take 1 capsule (100 mg total) by mouth every 6 (six) hours as needed for cough.   diclofenac sodium 1 % Gel Commonly known as:  VOLTAREN Apply 2 g topically 2 (two) times daily as needed. What changed:  reasons to take this   hydrochlorothiazide 25 MG tablet Commonly known as:  HYDRODIURIL Take 25 mg by mouth daily.   isosorbide mononitrate 30 MG 24 hr tablet Commonly known as:  IMDUR Take 30 mg by mouth daily.   Lantus SoloStar 100 UNIT/ML Solostar Pen Generic drug:  Insulin Glargine Inject 43 Units into the skin daily.  loratadine 10 MG tablet Commonly known as:  CLARITIN Take 10 mg by mouth daily.   losartan 25 MG tablet Commonly known as:  COZAAR Take 25 mg by mouth daily.   multivitamin with minerals Tabs tablet Take 1 tablet by mouth daily. Start taking on:  Jul 31, 2018   nortriptyline 10 MG capsule Commonly known as:  PAMELOR Take 30 mg by mouth at bedtime.   omeprazole 20 MG capsule Commonly known as:   PRILOSEC Take 20 mg by mouth daily.   ondansetron 4 MG disintegrating tablet Commonly known as:  Zofran ODT Take 1 tablet (4 mg total) by mouth every 8 (eight) hours as needed for nausea or vomiting.   oxyCODONE 5 MG immediate release tablet Commonly known as:  Roxicodone Take 1-2 tablets (5-10 mg total) by mouth every 4 (four) hours as needed.   ProAir HFA 108 (90 Base) MCG/ACT inhaler Generic drug:  albuterol Inhale 2 puffs into the lungs every 6 (six) hours as needed for wheezing or shortness of breath.   sitaGLIPtin 100 MG tablet Commonly known as:  JANUVIA Take 100 mg by mouth daily.   SUMAtriptan 100 MG tablet Commonly known as:  IMITREX Take 100 mg by mouth every 2 (two) hours as needed for migraine. May repeat in 2 hours if headache persists or recurs.   venlafaxine XR 150 MG 24 hr capsule Commonly known as:  EFFEXOR-XR Take 150 mg by mouth daily with breakfast.   Vitamin D3 50 MCG (2000 UT) Tabs Take 2,000 Units by mouth daily.        DISCHARGE INSTRUCTIONS:    If you experience worsening of your admission symptoms, develop shortness of breath, life threatening emergency, suicidal or homicidal thoughts you must seek medical attention immediately by calling 911 or calling your MD immediately  if symptoms less severe.  You Must read complete instructions/literature along with all the possible adverse reactions/side effects for all the Medicines you take and that have been prescribed to you. Take any new Medicines after you have completely understood and accept all the possible adverse reactions/side effects.   Please note  You were cared for by a hospitalist during your hospital stay. If you have any questions about your discharge medications or the care you received while you were in the hospital after you are discharged, you can call the unit and asked to speak with the hospitalist on call if the hospitalist that took care of you is not available. Once you are  discharged, your primary care physician will handle any further medical issues. Please note that NO REFILLS for any discharge medications will be authorized once you are discharged, as it is imperative that you return to your primary care physician (or establish a relationship with a primary care physician if you do not have one) for your aftercare needs so that they can reassess your need for medications and monitor your lab values.    Today   CHIEF COMPLAINT:   Chief Complaint  Patient presents with  . Altered Mental Status    HISTORY OF PRESENT ILLNESS:     VITAL SIGNS:  Blood pressure 122/86, pulse 80, temperature 98.2 F (36.8 C), resp. rate 16, height 5\' 1"  (1.549 m), weight 72.6 kg, SpO2 94 %.  I/O:    Intake/Output Summary (Last 24 hours) at 07/30/2018 1216 Last data filed at 07/30/2018 1010 Gross per 24 hour  Intake 976.39 ml  Output 0 ml  Net 976.39 ml    PHYSICAL EXAMINATION:  GENERAL:  73 y.o.-year-old patient lying in the bed with no acute distress.  EYES: Pupils equal, round, reactive to light and accommodation. No scleral icterus. Extraocular muscles intact.  HEENT: Head atraumatic, normocephalic. Oropharynx and nasopharynx clear.  NECK:  Supple, no jugular venous distention. No thyroid enlargement, no tenderness.  LUNGS: Normal breath sounds bilaterally, no wheezing, rales,rhonchi or crepitation. No use of accessory muscles of respiration.  CARDIOVASCULAR: S1, S2 normal. No murmurs, rubs, or gallops.  ABDOMEN: Soft, non-tender, non-distended. Bowel sounds present. No organomegaly or mass.  EXTREMITIES: No pedal edema, cyanosis, or clubbing.  NEUROLOGIC: Cranial nerves II through XII are intact. Muscle strength 5/5 in all extremities. Sensation intact. Gait not checked.  PSYCHIATRIC: The patient is alert and oriented x 3.  SKIN: No obvious rash, lesion, or ulcer.   DATA REVIEW:   CBC Recent Labs  Lab 07/30/18 0455  WBC 9.1  HGB 12.8  HCT 39.3  PLT  276    Chemistries  Recent Labs  Lab 07/29/18 1530 07/30/18 0455  NA 138 137  K 3.6 3.7  CL 101 102  CO2 28 28  GLUCOSE 206* 304*  BUN 19 20  CREATININE 1.37* 1.53*  CALCIUM 8.9 8.2*  AST 19  --   ALT 17  --   ALKPHOS 88  --   BILITOT 0.4  --     Cardiac Enzymes Recent Labs  Lab 07/26/18 1350  TROPONINI <0.03    Microbiology Results  Results for orders placed or performed during the hospital encounter of 07/29/18  SARS Coronavirus 2 [facility] order, Performed in [place] hospital lab)     Status: None   Collection Time: 07/29/18  9:33 PM  Result Value Ref Range Status   SARS Coronavirus 2 NEGATIVE NEGATIVE Final    Comment: (NOTE) If result is NEGATIVE SARS-CoV-2 target nucleic acids are NOT DETECTED. The SARS-CoV-2 RNA is generally detectable in upper and lower  respiratory specimens during the acute phase of infection. The lowest  concentration of SARS-CoV-2 viral copies this assay can detect is 250  copies / mL. A negative result does not preclude SARS-CoV-2 infection  and should not be used as the sole basis for treatment or other  patient management decisions.  A negative result may occur with  improper specimen collection / handling, submission of specimen other  than nasopharyngeal swab, presence of viral mutation(s) within the  areas targeted by this assay, and inadequate number of viral copies  (<250 copies / mL). A negative result must be combined with clinical  observations, patient history, and epidemiological information. If result is POSITIVE SARS-CoV-2 target nucleic acids are DETECTED. The SARS-CoV-2 RNA is generally detectable in upper and lower  respiratory specimens dur ing the acute phase of infection.  Positive  results are indicative of active infection with SARS-CoV-2.  Clinical  correlation with patient history and other diagnostic information is  necessary to determine patient infection status.  Positive results do  not rule out  bacterial infection or co-infection with other viruses. If result is PRESUMPTIVE POSTIVE SARS-CoV-2 nucleic acids MAY BE PRESENT.   A presumptive positive result was obtained on the submitted specimen  and confirmed on repeat testing.  While 2019 novel coronavirus  (SARS-CoV-2) nucleic acids may be present in the submitted sample  additional confirmatory testing may be necessary for epidemiological  and / or clinical management purposes  to differentiate between  SARS-CoV-2 and other Sarbecovirus currently known to infect humans.  If clinically indicated additional testing with  an alternate test  methodology [phone]) is advised. The SARS-CoV-2 RNA is generally  detectable in upper and lower respiratory sp ecimens during the acute  phase of infection. The expected result is Negative. Fact Sheet for Patients:  StrictlyIdeas.no Fact Sheet for Healthcare Providers: BankingDealers.co.za This test is not yet approved or cleared by the [place] FDA and has been authorized for detection and/or diagnosis of SARS-CoV-2 by FDA under an Emergency Use Authorization (EUA).  This EUA will remain in effect (meaning this test can be used) for the duration of the COVID-19 declaration under Section 564(b)(1) of the Act, 21 U.S.C. section 360bbb-3(b)(1), unless the authorization is terminated or revoked sooner. Performed at [facility], [address] [place],  [address]     RADIOLOGY:  Ct Head Wo Contrast  Result Date: 07/29/2018 CLINICAL DATA:  Headache, dizziness. EXAM: CT HEAD WITHOUT CONTRAST TECHNIQUE: Contiguous axial images were obtained from the base of the skull through the vertex without intravenous contrast. COMPARISON:  CT scan of January 05, 2015. FINDINGS: Brain: Mild chronic ischemic white matter disease is noted. No mass effect or midline shift is noted. Ventricular size is within normal limits. There is no evidence  of mass lesion, hemorrhage or acute infarction. Vascular: No hyperdense vessel or unexpected calcification. Skull: Normal. Negative for fracture or focal lesion. Sinuses/Orbits: No acute finding. Other: None. IMPRESSION: Mild chronic ischemic white matter disease. No acute intracranial abnormality seen. Electronically Signed   By: Marijo Conception M.D.   On: 07/29/2018 16:01   Mr Brain Wo Contrast  Result Date: 07/29/2018 CLINICAL DATA:  Initial evaluation for acute encephalopathy, increased forgetfulness, headache, dizziness. EXAM: MRI HEAD WITHOUT CONTRAST TECHNIQUE: Multiplanar, multiecho pulse sequences of the brain and surrounding structures were obtained without intravenous contrast. COMPARISON:  Prior CT from earlier same day. FINDINGS: Brain: Cerebral volume within normal limits for patient age. Scattered patchy T2/FLAIR hyperintensity within the periventricular and deep white matter both cerebral hemispheres most consistent with chronic small vessel ischemic disease, mild in nature. No abnormal foci of restricted diffusion to suggest acute or subacute ischemia. Gray-white matter differentiation well maintained. No encephalomalacia to suggest chronic infarction. No foci of susceptibility artifact to suggest acute or chronic intracranial hemorrhage. No mass lesion, midline shift or mass effect. No hydrocephalus. No extra-axial fluid collection. Major dural sinuses are grossly patent. Note made of a partially empty sella. Midline structures intact and normal. Vascular: Major intracranial vascular flow voids well maintained and normal in appearance. Skull and upper cervical spine: Craniocervical junction normal. Multilevel degenerative spondylolysis noted within the upper cervical spine without significant stenosis. Bone marrow signal intensity normal. No scalp soft tissue abnormality. Sinuses/Orbits: Globes and orbital soft tissues within normal limits. Paranasal sinuses are clear. No mastoid effusion.  Inner ear structures normal. Other: None. IMPRESSION: 1. No acute intracranial abnormality identified. No findings to explain patient's symptoms. 2. Generalized age appropriate cerebral volume loss with mild chronic small vessel ischemic disease. Electronically Signed   By: Jeannine Boga M.D.   On: 07/29/2018 19:41   US Carotid Bilateral  Result Date: 07/30/2018 CLINICAL DATA:  Headache.  Dizziness.  Increased forgetfulness. EXAM: BILATERAL CAROTID DUPLEX ULTRASOUND TECHNIQUE: Pearline Cables scale imaging, color Doppler and duplex ultrasound were performed of bilateral carotid and vertebral arteries in the neck. COMPARISON:  12/20/2009 FINDINGS: Criteria: Quantification of carotid stenosis is based on velocity parameters that correlate the residual internal carotid diameter with NASCET-based stenosis levels, using the diameter of the distal internal carotid lumen as the denominator  for stenosis measurement. The following velocity measurements were obtained: RIGHT ICA: 65 cm/sec CCA: 50 cm/sec SYSTOLIC ICA/CCA RATIO:  1.3 ECA: 57 cm/sec LEFT ICA: 39 cm/sec CCA: 64 cm/sec SYSTOLIC ICA/CCA RATIO:  0.6 ECA: 55 cm/sec RIGHT CAROTID ARTERY: Little if any plaque in the bulb. Low resistance internal carotid Doppler pattern is preserved. RIGHT VERTEBRAL ARTERY:  Antegrade. LEFT CAROTID ARTERY: Little if any plaque in the bulb. Low resistance internal carotid Doppler pattern is preserved. LEFT VERTEBRAL ARTERY:  Antegrade. IMPRESSION: Less than 50% stenosis in the right and left internal carotid arteries. Electronically Signed   By: Marybelle Killings M.D.   On: 07/30/2018 11:27    EKG:   Orders placed or performed during the hospital encounter of 07/29/18  . ED EKG  . ED EKG      Management plans discussed with the patient, family and they are in agreement.  CODE STATUS:     Code Status Orders  (From admission, onward)         Start     Ordered   07/29/18 2130  Full code  Continuous     07/29/18 2129         Code Status History    Date Active Date Inactive Code Status Order ID Comments User Context   04/25/2017 1744 04/25/2017 2119 Full Code [id number]  Poggi, Marshall Cork, MD Inpatient      TOTAL TIME TAKING CARE OF THIS PATIENT: 35 minutes.    Avel Peace Salary M.D on 07/30/2018 at 12:16 PM  Between 7am to 6pm - Pager - [phone]  After 6pm go to [email or web address] - password [email or web address] [place] [facility]  Office  [phone]  CC: Primary care physician; Center, [facility]   Note: This dictation was prepared with Dragon dictation along with smaller phrase technology. Any transcriptional errors that result from this process are unintentional.

## 2018-07-30 NOTE — Care Management CC44 (Signed)
Condition Code 44 Documentation Completed  Patient Details  Name: Theresa Booker MRN: [id number] Date of Birth: Jul 10, 1945   Condition Code 44 given:  Yes Patient signature on Condition Code 44 notice:    Documentation of 2 MD's agreement:  Yes Code 44 added to claim:  Yes    Annamaria Boots, [place] 07/30/2018, 3:51 PM

## 2018-07-30 NOTE — Progress Notes (Signed)
Inpatient Diabetes Program Recommendations  AACE/ADA: New Consensus Statement on Inpatient Glycemic Control (2015)  Target Ranges:  Prepandial:   less than 140 mg/dL      Peak postprandial:   less than 180 mg/dL (1-2 hours)      Critically ill patients:  140 - 180 mg/dL   Lab Results  Component Value Date   GLUCAP 263 (H) 07/30/2018   HGBA1C 8.1 (H) 07/30/2018    Review of Glycemic Control Results for Theresa Booker, Theresa Booker (MRN [id number]) as of 07/30/2018 11:03  Ref. Range 07/29/2018 15:24 07/29/2018 22:43 07/30/2018 08:15  Glucose-Capillary Latest Ref Range: 70 - 99 mg/dL 175 (H) 153 (H) 263 (H)   Diabetes history: DM 2 Outpatient Diabetes medications: Lantus 43 units daily, Januvia 100 mg daily Current orders for Inpatient glycemic control:  Novolog moderate tid with meals and HS Lantus 43 units daily Tradjenta 5 mg daily Inpatient Diabetes Program Recommendations:   Note that patient refused Lantus last PM.  Consider reducing Lantus to 20 units daily (first dose this morning).  A1C is improved from past according to documentation.   Will follow.   Thanks  Adah Perl, RN, BC-ADM Inpatient Diabetes Coordinator Pager [phone]

## 2018-07-30 NOTE — Care Management CC44 (Signed)
Condition Code 44 Documentation Completed  Patient Details  Name: Theresa Booker MRN: [id number] Date of Birth: [birth year]   Condition Code 44 given:    Patient signature on Condition Code 44 notice:    Documentation of 2 MD's agreement:    Code 44 added to claim:       Annamaria Boots, [place] 07/30/2018, 3:50 PM

## 2018-07-30 NOTE — Evaluation (Signed)
Physical Therapy Evaluation Patient Details Name: Theresa Booker MRN: [id number] DOB: 09-30-1945 Today's Date: 07/30/2018   History of Present Illness  Patient admitted for AMS with c/o of headache and dizziness. PMH includes ashtma, CKD stage III, depression, DM, fibromyalgia, HLD, cocaine abuse, and anemia. Patient pleasantly confused however forgot conversation midsentance and then would answer question with a different answer making it challenging to obtain history and PLOF.   Clinical Impression  Patient is a pleasantly confused 73 year old female with diffuse weakness and high fall risk. Patient had multiple near LOB/falls when ambulating and transferring with PT requiring PT to regain COM. Patient requires frequent cueing for task orientation due to forgetting task frequently when in process of performing. Patient frequently starts a sentence/answer but forgets what she is saying, often then answering a second time with a different answer. Due to patient living alone and having high fall risk patient with confusion she would benefit from placement in SNF upon discharge. Patient is not safe for ambulating alone at this time. Patient will benefit from skilled PT while in hospital to increase stability, strength, and decrease falls risk.     Follow Up Recommendations SNF;Supervision for mobility/OOB    Equipment Recommendations  Other (comment)(patient unable to tell PT what she currently has)    Recommendations for Other Services OT consult     Precautions / Restrictions Precautions Precautions: Fall Precaution Comments: dizziness and fatigue with LOB Restrictions Weight Bearing Restrictions: No      Mobility  Bed Mobility               General bed mobility comments: in chair upon PT arrival  Transfers Overall transfer level: Needs assistance Equipment used: Rolling walker (2 wheeled) Transfers: Sit to/from Stand Sit to Stand: Min guard;Min assist         General  transfer comment: Patient had one LOB requiring PT to hold onto patient to avoid fall. able to stay upright once assisted by PT however c/o headache and dizziness. HR and Sp02 WFL  Ambulation/Gait Ambulation/Gait assistance: Min assist Gait Distance (Feet): 30 Feet Assistive device: Rolling walker (2 wheeled)   Gait velocity: decreased   General Gait Details: Patient had two near LOB requiring PT to return patient to upright posture. Patient VERY unsteady with improper hand placement on walker requiring max cueing for orientation and sequencing with proper use of RW. Patient has staggering right and left with occasional posterior LOB.   Stairs            Wheelchair Mobility    Modified Rankin (Stroke Patients Only)       Balance Overall balance assessment: Needs assistance;History of Falls Sitting-balance support: Feet supported Sitting balance-Leahy Scale: Poor Sitting balance - Comments: unable to reach with UE without LOB. reports dizziness and headache with movement Postural control: Posterior lean Standing balance support: Bilateral upper extremity supported Standing balance-Leahy Scale: Poor Standing balance comment: Patient had multiple episodes of LOB requiring PT to assist patient in returning to upright position to avoid losing balance.                              Pertinent Vitals/Pain Pain Assessment: 0-10 Pain Score: 4  Pain Location: headache, increases to 9/10  Pain Descriptors / Indicators: Aching;Headache Pain Intervention(s): Limited activity within patient's tolerance;Repositioned;Monitored during session;Relaxation    Home Living Family/patient expects to be discharged to:: Private residence Living Arrangements: Alone Available Help at Discharge:  Other (Comment)(pt states her grandson sometimes comes over. Then was confused and said hes at college and doesnt come over?) Type of Home: Other(Comment)(patient stated apartment, then said  house, then said apartment, so unsure?) Home Access: Stairs to enter Entrance Stairs-Rails: (patient can't remember) Entrance Stairs-Number of Steps: 5-7 Home Layout: One level Home Equipment: [place]n - single point;Walker - 2 wheels Additional Comments: Patient is confused, forgets answers/conversations halfway through a sentance, upon prompting picks back up and answers a different answer.     Prior Function Level of Independence: Independent with assistive device(s)         Comments: Per patient she was using a cane or a walker to get around, had outpatient physical therapy at [facility] outpatient     Hand Dominance        Extremity/Trunk Assessment   Upper Extremity Assessment Upper Extremity Assessment: Generalized weakness    Lower Extremity Assessment Lower Extremity Assessment: RLE deficits/detail;LLE deficits/detail RLE Deficits / Details: significant pain with movement limiting full functional range RLE Sensation: decreased light touch RLE Coordination: decreased gross motor LLE Deficits / Details: grossly 3+/5 LLE Sensation: decreased light touch LLE Coordination: decreased gross motor       Communication   Communication: Other (comment)(confused)  Cognition Arousal/Alertness: Awake/alert Behavior During Therapy: Anxious(confused, becomes anxious when confused) Overall Cognitive Status: No family/caregiver present to determine baseline cognitive functioning                                 General Comments: Patient confused, forgetting conversation halfway through a sentance, then answered with different answer then the first answer      General Comments General comments (skin integrity, edema, etc.): confused and tangential with limited ability to focus on one task/maintain focus on one task. Swelling of R knee painful with movement (pt states has been like that for a long time) patient very out of breathe and fatigued    Exercises Other  Exercises Other Exercises: education on safe transfers and ambulation with cueing for correct hand placement, use of RW, and technique to decrease fall risk.    Assessment/Plan    PT Assessment Patient needs continued PT services  PT Problem List Decreased balance;Decreased strength;Decreased range of motion;Decreased activity tolerance;Decreased mobility;Decreased coordination;Decreased safety awareness;Decreased knowledge of use of DME;Decreased cognition;Pain       PT Treatment Interventions DME instruction;Gait training;Stair training;Therapeutic exercise;Therapeutic activities;Functional mobility training;Balance training;Neuromuscular re-education;Manual techniques;Patient/family education    PT Goals (Current goals can be found in the Care Plan section)  Acute Rehab PT Goals Patient Stated Goal: to return home PT Goal Formulation: With patient Time For Goal Achievement: 08/13/18 Potential to Achieve Goals: Fair    Frequency Min 2X/week   Barriers to discharge Inaccessible home environment;Decreased caregiver support patient is not safe for stair negotiation at this time, lives alone and has a high fall risk    Co-evaluation               AM-PAC PT "6 Clicks" Mobility  Outcome Measure Help needed turning from your back to your side while in a flat bed without using bedrails?: A Little Help needed moving from lying on your back to sitting on the side of a flat bed without using bedrails?: A Little Help needed moving to and from a bed to a chair (including a wheelchair)?: A Little Help needed standing up from a chair using your arms (e.g., wheelchair or bedside chair)?: A  Little Help needed to walk in hospital room?: A Lot Help needed climbing 3-5 steps with a railing? : A Lot 6 Click Score: 16    End of Session Equipment Utilized During Treatment: Gait belt Activity Tolerance: Patient limited by fatigue;Patient limited by pain;Other (comment)(patient limited by  cognition, inability to follow tasks ) Patient left: in chair;with call bell/phone within reach;with chair alarm set Nurse Communication: Mobility status;Other (comment)(multiple near LOB when ambulating, not safe, needs someone with her) PT Visit Diagnosis: Unsteadiness on feet (R26.81);Other abnormalities of gait and mobility (R26.89);History of falling (Z91.81);Muscle weakness (generalized) (M62.81);Difficulty in walking, not elsewhere classified (R26.2);Other symptoms and signs involving the nervous system (R29.898);Dizziness and giddiness (R42);Pain Pain - Right/Left: Right Pain - part of body: Knee    Time: 1342-1401 PT Time Calculation (min) (ACUTE ONLY): 19 min   Charges:   PT Evaluation $PT Eval Moderate Complexity: 1 Mod PT Treatments $Therapeutic Activity: 8-22 mins        Janna Arch, PT, DPT    Janna Arch 07/30/2018, 2:35 PM

## 2018-07-30 NOTE — TOC Initial Note (Signed)
Transition of Care [facility]) - Initial/Assessment Note    Patient Details  Name: Theresa Booker MRN: [id number] Date of Birth: 1945/08/18  [facility] CM/SW Contact:    Annamaria Boots, [place] Phone Number: 07/30/2018, 4:27 PM  Clinical Narrative: Patient has been recommended for SNF. CSW met with patient who states that she would be willing to go to SNF at discharge. Patient is confused so CSW contacted patient's daughter in law Olegario Messier. Per Beau Fanny, patient lives alone and was caring for herself prior to hospitalization. CSW explained PT recommendation of SNF. Daughter in law is in agreement and would like a bed search to be done. CSW will begin bed search and give bed offers once available. CSW will continue to follow for discharge planning.                   Expected Discharge Plan: Skilled Nursing Facility Barriers to Discharge: Continued Medical Work up   Patient Goals and CMS Choice        Expected Discharge Plan and Services Expected Discharge Plan: [place]       Living arrangements for the past 2 months: Single Family Home Expected Discharge Date: 07/30/18                                    Prior Living Arrangements/Services Living arrangements for the past 2 months: Single Family Home Lives with:: Self Patient language and need for interpreter reviewed:: Yes Do you feel safe going back to the place where you live?: Yes      Need for Family Participation in Patient Care: Yes (Comment) Care giver support system in place?: Yes (comment)   Criminal Activity/Legal Involvement Pertinent to Current Situation/Hospitalization: No - Comment as needed  Activities of Daily Living Home Assistive Devices/Equipment: Dentures (specify type), Cane (specify quad or straight) ADL Screening (condition at time of admission) Patient's cognitive ability adequate to safely complete daily activities?: No Is the patient deaf or have difficulty  hearing?: No Does the patient have difficulty seeing, even when wearing glasses/contacts?: No Does the patient have difficulty concentrating, remembering, or making decisions?: Yes Patient able to express need for assistance with ADLs?: Yes Does the patient have difficulty dressing or bathing?: No Independently performs ADLs?: Yes (appropriate for developmental age) Does the patient have difficulty walking or climbing stairs?: Yes Weakness of Legs: Both Weakness of Arms/Hands: None  Permission Sought/Granted Permission sought to share information with : Case Manager, Customer service manager, Family Supports Permission granted to share information with : Yes, Verbal Permission Granted              Emotional Assessment Appearance:: Appears stated age Attitude/Demeanor/Rapport: Guarded   Orientation: : Oriented to Self, Oriented to Place Alcohol / Substance Use: Not Applicable Psych Involvement: No (comment)  Admission diagnosis:  Headache [R51] Altered mental status, unspecified altered mental status type [R41.82] Patient Active Problem List   Diagnosis Date Noted  . AMS (altered mental status) 07/30/2018  . Altered mental status 07/29/2018  . Cocaine use 05/03/2015  . Abnormal MRI, lumbar spine 05/03/2015  . Numbness in feet 04/29/2015  . Gait instability 04/29/2015  . BP (high blood pressure) 04/13/2015  . Fibromyalgia 04/13/2015  . Essential (primary) hypertension 04/13/2015  . Clinical depression 04/13/2015  . Chronic kidney disease (CKD), stage III (moderate) ([place]) 04/13/2015  . Airway hyperreactivity 04/13/2015  . Chronic knee pain (Right)  04/13/2015  . Chronic low back pain (Location of Secondary source of pain) (Bilateral) (R>L) 04/13/2015  . Chronic lower straight pain (Location of Primary Source of Pain) (Right) 04/13/2015  . Chronic lumbar radicular pain (Right) (L5 Dermatome) 04/13/2015  . At high risk for falls 04/13/2015  . Chronic hip pain (Right)  04/13/2015  . Chronic sacroiliac joint pain (Bilateral) (R>L) 04/13/2015  . Controlled type 2 diabetes mellitus without complication ([place]) 11/57/2620  . Headache disorder 02/01/2015  . Dizziness 02/01/2015  . Bad posture 02/01/2015  . Episodic tension type headache 01/10/2015  . Amnesia 10/18/2014  . 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 Acid reflux 09/24/2013  . Chronic pain 09/24/2013  . Avitaminosis D 02/11/2013   PCP:  [place]:   Levin Erp, [place] [address] [place] [address] [place] [address] Phone: [phone] Fax: [place] [address] ([address]), [place] [address] Phone: [phone] Fax: [phone]     Social Determinants of Health (SDOH) Interventions    Readmission Risk Interventions No flowsheet data found.

## 2018-07-30 NOTE — Progress Notes (Signed)
Daughter-in-law notified of discharge plan for rehab possibly tomorrow and to expect to be in contact with SW. Madlyn Frankel, RN

## 2018-07-30 NOTE — Care Management CC44 (Signed)
Condition Code 44 Documentation Completed  Patient Details  Name: JASILYN HOLDERMAN MRN: [id number] Date of Birth: 05/07/1945   Condition Code 44 given:    Patient signature on Condition Code 44 notice:    Documentation of 2 MD's agreement:    Code 44 added to claim:       Annamaria Boots, [place] 07/30/2018, 3:47 PM

## 2018-07-30 NOTE — NC FL2 (Signed)
[place] LEVEL OF CARE SCREENING TOOL     IDENTIFICATION  Patient Name: Theresa Booker Birthdate: 1945/12/29 Sex: female Admission Date (Current Location): 07/29/2018  [place] and [place] Number:  Engineering geologist and Address:  [facility], [address], [place], [place] [address]      Provider Number: [id number]  Attending Physician Name and Address:  Gorden Harms, MD  Relative Name and Phone Number:       Current Level of Care: Hospital Recommended Level of Care: [place] Prior Approval Number:    Date Approved/Denied:   PASRR Number: pending  Discharge Plan: SNF    Current Diagnoses: Patient Active Problem List   Diagnosis Date Noted  . AMS (altered mental status) 07/30/2018  . Altered mental status 07/29/2018  . Cocaine use 05/03/2015  . Abnormal MRI, lumbar spine 05/03/2015  . Numbness in feet 04/29/2015  . Gait instability 04/29/2015  . BP (high blood pressure) 04/13/2015  . Fibromyalgia 04/13/2015  . Essential (primary) hypertension 04/13/2015  . Clinical depression 04/13/2015  . Chronic kidney disease (CKD), stage III (moderate) ([place]) 04/13/2015  . Airway hyperreactivity 04/13/2015  . Chronic knee pain (Right) 04/13/2015  . Chronic low back pain (Location of Secondary source of pain) (Bilateral) (R>L) 04/13/2015  . Chronic lower straight pain (Location of Primary Source of Pain) (Right) 04/13/2015  . Chronic lumbar radicular pain (Right) (L5 Dermatome) 04/13/2015  . At high risk for falls 04/13/2015  . Chronic hip pain (Right) 04/13/2015  . Chronic sacroiliac joint pain (Bilateral) (R>L) 04/13/2015  . Controlled type 2 diabetes mellitus without complication ([place]) 62/83/1517  . Headache disorder 02/01/2015  . Dizziness 02/01/2015  . Bad posture 02/01/2015  . Episodic tension type headache 01/10/2015  . Amnesia 10/18/2014  . Combined fat and carbohydrate induced hyperlipemia 09/24/2013   . Acid reflux 09/24/2013  . Chronic pain 09/24/2013  . Avitaminosis D 02/11/2013    Orientation RESPIRATION BLADDER Height & Weight     Self, Place  Normal Continent Weight: 160 lb (72.6 kg) Height:  5\' 1"  (154.9 cm)  BEHAVIORAL SYMPTOMS/MOOD NEUROLOGICAL BOWEL NUTRITION STATUS  (none) (none) Continent Diet(Carb Modified )  AMBULATORY STATUS COMMUNICATION OF NEEDS Skin   Extensive Assist Verbally Normal                       Personal Care Assistance Level of Assistance  Bathing, Feeding, Dressing Bathing Assistance: Limited assistance Feeding assistance: Independent Dressing Assistance: Limited assistance     Functional Limitations Info  Sight, Hearing, Speech Sight Info: Adequate Hearing Info: Adequate Speech Info: Adequate    SPECIAL CARE FACTORS FREQUENCY  PT (By licensed PT), OT (By licensed OT)     PT Frequency: 5 OT Frequency: 5            Contractures      Additional Factors Info  Code Status, Allergies Code Status Info: Full Code  Allergies Info: Ace Inhibitors, Pollen Extracts Pollen Extract, Vicodin            Current Medications (07/30/2018):  This is the current hospital active medication list Current Facility-Administered Medications  Medication Dose Route Frequency Provider Last Rate Last Dose  . acetaminophen (TYLENOL) tablet 650 mg  650 mg Oral Q6H PRN Sela Hua, MD   650 mg at 07/30/18 [phone]  . enoxaparin (LOVENOX) injection 30 mg  30 mg Subcutaneous Q24H Salary, Montell D, MD      . folic acid (  FOLVITE) tablet 1 mg  1 mg Oral Daily Seals, Theo Dills, NP   1 mg at 07/30/18 3086  . hydrochlorothiazide (HYDRODIURIL) tablet 25 mg  25 mg Oral Daily Seals, Theo Dills, NP   25 mg at 07/30/18 5784  . insulin aspart (novoLOG) injection 0-15 Units  0-15 Units Subcutaneous TID WC Seals, Theo Dills, NP   3 Units at 07/30/18 1228  . insulin aspart (novoLOG) injection 0-5 Units  0-5 Units Subcutaneous QHS Seals, Angela H, NP      . insulin glargine  (LANTUS) injection 43 Units  43 Units Subcutaneous Daily Seals, Angela H, NP      . ipratropium-albuterol (DUONEB) 0.5-2.5 (3) MG/3ML nebulizer solution 3 mL  3 mL Nebulization Q6H PRN Seals, Theo Dills, NP      . isosorbide mononitrate (IMDUR) 24 hr tablet 30 mg  30 mg Oral Daily Seals, Angela H, NP   30 mg at 07/30/18 0902  . linagliptin (TRADJENTA) tablet 5 mg  5 mg Oral Daily Seals, Theo Dills, NP   5 mg at 07/30/18 6962  . loratadine (CLARITIN) tablet 10 mg  10 mg Oral Daily Seals, Theo Dills, NP   10 mg at 07/30/18 0902  . losartan (COZAAR) tablet 25 mg  25 mg Oral Daily Seals, Theo Dills, NP   25 mg at 07/30/18 9528  . multivitamin with minerals tablet 1 tablet  1 tablet Oral Daily Seals, Theo Dills, NP   1 tablet at 07/30/18 0902  . nortriptyline (PAMELOR) capsule 30 mg  30 mg Oral QHS Seals, Angela H, NP      . ondansetron (ZOFRAN) tablet 4 mg  4 mg Oral Q6H PRN Seals, Theo Dills, NP       Or  . ondansetron (ZOFRAN) injection 4 mg  4 mg Intravenous Q6H PRN Seals, Theo Dills, NP      . pantoprazole (PROTONIX) EC tablet 40 mg  40 mg Oral Daily Seals, Levada Dy H, NP   40 mg at 07/30/18 0902  . polyethylene glycol (MIRALAX / GLYCOLAX) packet 17 g  17 g Oral Daily PRN Seals, Levada Dy H, NP      . sodium chloride flush (NS) 0.9 % injection 3 mL  3 mL Intravenous Q12H Seals, Angela H, NP   3 mL at 07/30/18 0908  . thiamine (VITAMIN B-1) tablet 100 mg  100 mg Oral Daily Seals, Angela H, NP   100 mg at 07/30/18 4132  . venlafaxine XR (EFFEXOR-XR) 24 hr capsule 150 mg  150 mg Oral Q breakfast Seals, Angela H, NP   150 mg at 07/30/18 4401     Discharge Medications: Please see discharge summary for a list of discharge medications.  Relevant Imaging Results:  Relevant Lab Results:   Additional Information SSN: [id number]  Annamaria Boots, [place]

## 2018-07-30 NOTE — Progress Notes (Signed)
Anticoagulation monitoring(Lovenox):  72yo  F ordered Lovenox 40 mg Q24h  Filed Weights   07/29/18 1515  Weight: 160 lb (72.6 kg)   BMI 30   Lab Results  Component Value Date   CREATININE 1.53 (H) 07/30/2018   CREATININE 1.37 (H) 07/29/2018   CREATININE 1.43 (H) 07/26/2018   Estimated Creatinine Clearance: 29.4 mL/min (A) (by C-G formula based on SCr of 1.53 mg/dL (H)). Hemoglobin & Hematocrit     Component Value Date/Time   HGB 12.8 07/30/2018 0455   HGB 10.5 (L) 07/10/2013 0510   HCT 39.3 07/30/2018 0455   HCT 32.1 (L) 07/10/2013 0510     Per Protocol for Patient with estCrcl < 30 ml/min and BMI < 40, will transition to Lovenox 30 mg Q24h      Theresa Booker PharmD Clinical Pharmacist 07/30/2018

## 2018-07-30 NOTE — Consult Note (Signed)
Reason for Consult: confusion  Referring Physician: Dr. Jerelyn Charles   CC: confusion   HPI: Theresa Booker is an 73 y.o. female history of asthma, CKD stage III, depression, diabetes mellitus, fibromyalgia, and hypertension.  She presents to the emergency room accompanied by her daughter who reports a one-week history of nagging frontal headache and new onset confusion. Headaches are currently resolved and mentation has improved. Likely close to baseline now.    Past Medical History:  Diagnosis Date  . Asthma   . Atypical chest pain    neg ETT at [facility]  . CKD (chronic kidney disease), stage III ([place])   . Depression   . Diabetes mellitus without complication ([place])   . Fibromyalgia   . HLD (hyperlipidemia)   . Hypertension   . Vitamin D deficiency     Past Surgical History:  Procedure Laterality Date  . ABDOMINAL HYSTERECTOMY    . COLONOSCOPY WITH PROPOFOL N/A 05/22/2016   Procedure: COLONOSCOPY WITH PROPOFOL;  Surgeon: Lollie Sails, MD;  Location: [facility] ENDOSCOPY;  Service: Endoscopy;  Laterality: N/A;  . ESOPHAGOGASTRODUODENOSCOPY (EGD) WITH PROPOFOL N/A 05/22/2016   Procedure: ESOPHAGOGASTRODUODENOSCOPY (EGD) WITH PROPOFOL;  Surgeon: Lollie Sails, MD;  Location: [facility] ENDOSCOPY;  Service: Endoscopy;  Laterality: N/A;  . HAND SURGERY Right 2014   Dr Rudene Christians  . rotator cuff surgery Left 03/2013   DR Rudene Christians  . SHOULDER ARTHROSCOPY WITH OPEN ROTATOR CUFF REPAIR Right 04/25/2017   Procedure: SHOULDER ARTHROSCOPY WITH OPEN ROTATOR CUFF REPAIR;  Surgeon: Corky Mull, MD;  Location: [facility] ORS;  Service: Orthopedics;  Laterality: Right;  . SHOULDER ARTHROSCOPY WITH OPEN ROTATOR CUFF REPAIR Right 03/11/2018   Procedure: SHOULDER ARTHROSCOPY WITH Recurrent  OPEN ROTATOR CUFF REPAIR;  Surgeon: Corky Mull, MD;  Location: [facility] ORS;  Service: Orthopedics;  Laterality: Right;  debridment and decompression    Family History  Problem Relation Age of Onset  . Diabetes Mother   . Breast cancer  Mother 61  . Cancer Sister   . Cancer Father   . Liver disease Brother   . Breast cancer Maternal Aunt   . Breast cancer Cousin        maternal    Social History:  reports that she has never smoked. She has never used smokeless tobacco. She reports that she does not drink alcohol or use drugs.  Allergies  Allergen Reactions  . Ace Inhibitors Other (See Comments) and Cough    Constant persistent cough  . Pollen Extracts [Pollen Extract] Other (See Comments)    Sneezing/watery eyes/itchy eye  . Vicodin [Hydrocodone-Acetaminophen] Nausea And Vomiting    Physical Examination: Blood pressure 122/86, pulse 80, temperature 98.2 F (36.8 C), resp. rate 16, height 5\' 1"  (1.549 m), weight 72.6 kg, SpO2 94 %.   Neurological Examination   Mental Status: Alert, oriented to name. Slow to respond  Cranial Nerves: II: Discs flat bilaterally; Visual fields grossly normal, pupils equal, round, reactive to light and accommodation III,IV, VI: ptosis not present, extra-ocular motions intact bilaterally V,VII: smile symmetric, facial light touch sensation normal bilaterally VIII: hearing normal bilaterally IX,X: gag reflex present XI: bilateral shoulder shrug XII: midline tongue extension Motor: Right : Upper extremity   5/5    Left:     Upper extremity   5/5  Lower extremity   5/5     Lower extremity   5/5 Tone and bulk:normal tone throughout; no atrophy noted Sensory: Pinprick and light touch intact throughout, bilaterally Deep Tendon Reflexes: 2+ and  symmetric throughout Plantars: Right: downgoing   Left: downgoing Cerebellar: Not tested  Gait: not tested      Laboratory Studies:   Basic Metabolic Panel: Recent Labs  Lab 07/26/18 1350 07/29/18 1530 07/30/18 0455  NA 139 138 137  K 3.5 3.6 3.7  CL 103 101 102  CO2 27 28 28   GLUCOSE 134* 206* 304*  BUN 24* 19 20  CREATININE 1.43* 1.37* 1.53*  CALCIUM 9.0 8.9 8.2*    Liver Function Tests: Recent Labs  Lab  07/29/18 1530  AST 19  ALT 17  ALKPHOS 88  BILITOT 0.4  PROT 7.8  ALBUMIN 3.7   No results for input(s): LIPASE, AMYLASE in the last 168 hours. No results for input(s): AMMONIA in the last 168 hours.  CBC: Recent Labs  Lab 07/26/18 1350 07/29/18 1530 07/30/18 0455  WBC 10.0 9.4 9.1  NEUTROABS 5.5 5.0  --   HGB 14.0 13.9 12.8  HCT 42.7 42.9 39.3  MCV 87.3 86.7 87.9  PLT 293 291 276    Cardiac Enzymes: Recent Labs  Lab 07/26/18 1350  TROPONINI <0.03    BNP: Invalid input(s): POCBNP  CBG: Recent Labs  Lab 07/29/18 1524 07/29/18 2243 07/30/18 0815 07/30/18 1212  GLUCAP 175* 153* 263* 185*    Microbiology: Results for orders placed or performed during the hospital encounter of 07/29/18  SARS Coronavirus 2 [facility] order, Performed in [place] hospital lab)     Status: None   Collection Time: 07/29/18  9:33 PM  Result Value Ref Range Status   SARS Coronavirus 2 NEGATIVE NEGATIVE Final    Comment: (NOTE) If result is NEGATIVE SARS-CoV-2 target nucleic acids are NOT DETECTED. The SARS-CoV-2 RNA is generally detectable in upper and lower  respiratory specimens during the acute phase of infection. The lowest  concentration of SARS-CoV-2 viral copies this assay can detect is 250  copies / mL. A negative result does not preclude SARS-CoV-2 infection  and should not be used as the sole basis for treatment or other  patient management decisions.  A negative result may occur with  improper specimen collection / handling, submission of specimen other  than nasopharyngeal swab, presence of viral mutation(s) within the  areas targeted by this assay, and inadequate number of viral copies  (<250 copies / mL). A negative result must be combined with clinical  observations, patient history, and epidemiological information. If result is POSITIVE SARS-CoV-2 target nucleic acids are DETECTED. The SARS-CoV-2 RNA is generally detectable in upper and lower  respiratory  specimens dur ing the acute phase of infection.  Positive  results are indicative of active infection with SARS-CoV-2.  Clinical  correlation with patient history and other diagnostic information is  necessary to determine patient infection status.  Positive results do  not rule out bacterial infection or co-infection with other viruses. If result is PRESUMPTIVE POSTIVE SARS-CoV-2 nucleic acids MAY BE PRESENT.   A presumptive positive result was obtained on the submitted specimen  and confirmed on repeat testing.  While 2019 novel coronavirus  (SARS-CoV-2) nucleic acids may be present in the submitted sample  additional confirmatory testing may be necessary for epidemiological  and / or clinical management purposes  to differentiate between  SARS-CoV-2 and other Sarbecovirus currently known to infect humans.  If clinically indicated additional testing with an alternate test  methodology [phone]) is advised. The SARS-CoV-2 RNA is generally  detectable in upper and lower respiratory sp ecimens during the acute  phase of infection. The expected  result is Negative. Fact Sheet for Patients:  StrictlyIdeas.no Fact Sheet for Healthcare Providers: BankingDealers.co.za This test is not yet approved or cleared by the [place] FDA and has been authorized for detection and/or diagnosis of SARS-CoV-2 by FDA under an Emergency Use Authorization (EUA).  This EUA will remain in effect (meaning this test can be used) for the duration of the COVID-19 declaration under Section 564(b)(1) of the Act, 21 U.S.C. section 360bbb-3(b)(1), unless the authorization is terminated or revoked sooner. Performed at [facility], [address] [place], [place] [address]     Coagulation Studies: Recent Labs    07/29/18 1530  LABPROT 14.0  INR 1.1    Urinalysis:  Recent Labs  Lab 07/29/18 1818  COLORURINE YELLOW*  LABSPEC 1.017  PHURINE 6.0   GLUCOSEU NEGATIVE  HGBUR NEGATIVE  BILIRUBINUR NEGATIVE  KETONESUR NEGATIVE  PROTEINUR 100*  NITRITE NEGATIVE  LEUKOCYTESUR NEGATIVE    Lipid Panel:  No results found for: CHOL, TRIG, HDL, CHOLHDL, VLDL, LDLCALC  HgbA1C:  Lab Results  Component Value Date   HGBA1C 8.1 (H) 07/30/2018    Urine Drug Screen:      Component Value Date/Time   LABOPIA NONE DETECTED 07/29/2018 2133   COCAINSCRNUR NONE DETECTED 07/29/2018 2133   LABBENZ NONE DETECTED 07/29/2018 2133   AMPHETMU NONE DETECTED 07/29/2018 2133   THCU NONE DETECTED 07/29/2018 2133   LABBARB NONE DETECTED 07/29/2018 2133    Alcohol Level: No results for input(s): ETH in the last 168 hours.  Other results: EKG: normal EKG, normal sinus rhythm, unchanged from previous tracings.  Imaging: Ct Head Wo Contrast  Result Date: 07/29/2018 CLINICAL DATA:  Headache, dizziness. EXAM: CT HEAD WITHOUT CONTRAST TECHNIQUE: Contiguous axial images were obtained from the base of the skull through the vertex without intravenous contrast. COMPARISON:  CT scan of January 05, 2015. FINDINGS: Brain: Mild chronic ischemic white matter disease is noted. No mass effect or midline shift is noted. Ventricular size is within normal limits. There is no evidence of mass lesion, hemorrhage or acute infarction. Vascular: No hyperdense vessel or unexpected calcification. Skull: Normal. Negative for fracture or focal lesion. Sinuses/Orbits: No acute finding. Other: None. IMPRESSION: Mild chronic ischemic white matter disease. No acute intracranial abnormality seen. Electronically Signed   By: Marijo Conception M.D.   On: 07/29/2018 16:01   Mr Brain Wo Contrast  Result Date: 07/29/2018 CLINICAL DATA:  Initial evaluation for acute encephalopathy, increased forgetfulness, headache, dizziness. EXAM: MRI HEAD WITHOUT CONTRAST TECHNIQUE: Multiplanar, multiecho pulse sequences of the brain and surrounding structures were obtained without intravenous contrast.  COMPARISON:  Prior CT from earlier same day. FINDINGS: Brain: Cerebral volume within normal limits for patient age. Scattered patchy T2/FLAIR hyperintensity within the periventricular and deep white matter both cerebral hemispheres most consistent with chronic small vessel ischemic disease, mild in nature. No abnormal foci of restricted diffusion to suggest acute or subacute ischemia. Gray-white matter differentiation well maintained. No encephalomalacia to suggest chronic infarction. No foci of susceptibility artifact to suggest acute or chronic intracranial hemorrhage. No mass lesion, midline shift or mass effect. No hydrocephalus. No extra-axial fluid collection. Major dural sinuses are grossly patent. Note made of a partially empty sella. Midline structures intact and normal. Vascular: Major intracranial vascular flow voids well maintained and normal in appearance. Skull and upper cervical spine: Craniocervical junction normal. Multilevel degenerative spondylolysis noted within the upper cervical spine without significant stenosis. Bone marrow signal intensity normal. No scalp soft tissue abnormality. Sinuses/Orbits: Globes and  orbital soft tissues within normal limits. Paranasal sinuses are clear. No mastoid effusion. Inner ear structures normal. Other: None. IMPRESSION: 1. No acute intracranial abnormality identified. No findings to explain patient's symptoms. 2. Generalized age appropriate cerebral volume loss with mild chronic small vessel ischemic disease. Electronically Signed   By: Jeannine Boga M.D.   On: 07/29/2018 19:41   US Carotid Bilateral  Result Date: 07/30/2018 CLINICAL DATA:  Headache.  Dizziness.  Increased forgetfulness. EXAM: BILATERAL CAROTID DUPLEX ULTRASOUND TECHNIQUE: Pearline Cables scale imaging, color Doppler and duplex ultrasound were performed of bilateral carotid and vertebral arteries in the neck. COMPARISON:  12/20/2009 FINDINGS: Criteria: Quantification of carotid stenosis is  based on velocity parameters that correlate the residual internal carotid diameter with NASCET-based stenosis levels, using the diameter of the distal internal carotid lumen as the denominator for stenosis measurement. The following velocity measurements were obtained: RIGHT ICA: 65 cm/sec CCA: 50 cm/sec SYSTOLIC ICA/CCA RATIO:  1.3 ECA: 57 cm/sec LEFT ICA: 39 cm/sec CCA: 64 cm/sec SYSTOLIC ICA/CCA RATIO:  0.6 ECA: 55 cm/sec RIGHT CAROTID ARTERY: Little if any plaque in the bulb. Low resistance internal carotid Doppler pattern is preserved. RIGHT VERTEBRAL ARTERY:  Antegrade. LEFT CAROTID ARTERY: Little if any plaque in the bulb. Low resistance internal carotid Doppler pattern is preserved. LEFT VERTEBRAL ARTERY:  Antegrade. IMPRESSION: Less than 50% stenosis in the right and left internal carotid arteries. Electronically Signed   By: Marybelle Killings M.D.   On: 07/30/2018 11:27     Assessment/Plan:  73 y.o. female history of asthma, CKD stage III, depression, diabetes mellitus, fibromyalgia, and hypertension.  She presents to the emergency room accompanied by her daughter who reports a one-week history of nagging frontal headache and new onset confusion. Headaches are currently resolved and mentation has improved. Likely close to baseline now.  - Imaging no acute abnormality  - Likely baseline dementia - d/c planning today - ASA 81 mg daily - Neurology follow up as out pt and cognitive testing.     07/30/2018, 12:26 PM

## 2018-07-30 NOTE — Plan of Care (Signed)
  Problem: Education: Goal: Knowledge of General Education information will improve Description Including pain rating scale, medication(s)/side effects and non-pharmacologic comfort measures Outcome: Progressing   Problem: Health Behavior/Discharge Planning: Goal: Ability to manage health-related needs will improve Outcome: Progressing   

## 2018-07-30 NOTE — Progress Notes (Signed)
PT Cancellation Note  Patient Details Name: Theresa Booker MRN: [id number] DOB: Sep 06, 1945   Cancelled Treatment:    Reason Eval/Treat Not Completed: Patient declined, no reason specified;Other (comment)(Patient eating lunch. Requests PT return once eaten. Will return at later time/date when available. )   Janna Arch, PT, DPT   07/30/2018, 12:45 PM

## 2018-07-31 DIAGNOSIS — R51 Headache: Secondary | ICD-10-CM | POA: Diagnosis not present

## 2018-07-31 LAB — URINE CULTURE: Culture: NO GROWTH

## 2018-07-31 LAB — GLUCOSE, CAPILLARY
Glucose-Capillary: 112 mg/dL — ABNORMAL HIGH (ref 70–99)
Glucose-Capillary: 218 mg/dL — ABNORMAL HIGH (ref 70–99)

## 2018-07-31 MED ORDER — ASPIRIN EC 81 MG PO TBEC
81.0000 mg | DELAYED_RELEASE_TABLET | Freq: Every day | ORAL | Status: DC
  Administered 2018-07-31: 10:00:00 81 mg via ORAL
  Filled 2018-07-31: qty 1

## 2018-07-31 NOTE — TOC Transition Note (Signed)
Transition of Care [facility]) - CM/SW Discharge Note   Patient Details  Name: [name] MRN: [id number] Date of Birth: November 18, 1945  Transition of [facility]) CM/SW Contact:  Su Hilt, RN Phone Number: 07/31/2018, 12:09 PM   Clinical Narrative:    Met with the patient to discuss DC, She still drives and has a RW at home, her grandson lives with her and helps somewhat Provided Transit resource brochure and low cost healthcare in [place] booklet She chose [facility] for Home health and I notified Corene Cornea, order is in place She states that there are no other needs    Final next level of care: [place] Barriers to Discharge: Barriers Resolved   Patient Goals and CMS Choice Patient states their goals for this hospitalization and ongoing recovery are:: go home CMS Medicare.gov Compare Post Acute Care list provided to:: Patient Choice offered to / list presented to : Patient  Discharge Placement                       Discharge Plan and Services                          HH Arranged: PT, RN, OT [facility] Agency: [place] ([place]) Date [place]: 07/31/18 Time [place]: 1209 Representative spoke with at [place]: [place] ([place]) Interventions     Readmission Risk Interventions No flowsheet data found.

## 2018-07-31 NOTE — Discharge Summary (Addendum)
[place] [place] NAME: Theresa Booker    MR#:  [id number]  DATE OF BIRTH:  1945-04-19  DATE OF ADMISSION:  07/29/2018 ADMITTING PHYSICIAN: Gladstone Lighter, MD  DATE OF DISCHARGE: No discharge date for patient encounter.  PRIMARY CARE PHYSICIAN: Center, [facility]    ADMISSION DIAGNOSIS:  Headache [R51] Altered mental status, unspecified altered mental status type [R41.82]  DISCHARGE DIAGNOSIS:  Active Problems:   Altered mental status   AMS (altered mental status)   SECONDARY DIAGNOSIS:   Past Medical History:  Diagnosis Date  . Asthma   . Atypical chest pain    neg ETT at [facility]  . CKD (chronic kidney disease), stage III ([place])   . Depression   . Diabetes mellitus without complication ([place])   . Fibromyalgia   . HLD (hyperlipidemia)   . Hypertension   . Vitamin D deficiency     HOSPITAL COURSE:  * Altered mental status ?  Suspected due to medication side effect/opioid use versus possible mild dementia Resolved Referred to the observation unit on our TIA/CVA protocol, MRI negative for stroke, carotid Dopplers negative for any hemodynamically significant stenosis, neurology consulted while in house-okay for discharge from their standpoint/we will arrange for outpatient follow-up given history of chronic intermittent episodes of confusion, per family-patient has been seen several times at other hospital facilities without a clear etiology, the family denies any history of dementia-noted Aricept use in the past in 2017 according to prior records, patient evaluated by physical therapy-recommended skilled nursing facility status post discharge, discharge held to work things out regarding this issue, the patient nor the patient's family are interested in rehab/nursing home placement, patient is able to ambulate over 300 feet with wheeled walker-typically uses cane at home  * asthma without exacerbation Provided  breathing treatments PRN  * Diabetes mellitus type 2 Controlled on current regiment  * Hypertension Stable on Cozaar and HCTZ  *  Fibromyalgia Stable  * CKD stage III Stable Secondary to diabetes  Case discussed with the patient as well as patient's family/daughter prior to discharge with all questions answered  DISCHARGE CONDITIONS:   stable  CONSULTS OBTAINED:  Treatment Team:  Gorden Harms, MD Alexis Goodell, MD  DRUG ALLERGIES:   Allergies  Allergen Reactions  . Ace Inhibitors Other (See Comments) and Cough    Constant persistent cough  . Pollen Extracts [Pollen Extract] Other (See Comments)    Sneezing/watery eyes/itchy eye  . Vicodin [Hydrocodone-Acetaminophen] Nausea And Vomiting    DISCHARGE MEDICATIONS:   Allergies as of 07/31/2018      Reactions   Ace Inhibitors Other (See Comments), Cough   Constant persistent cough   Pollen Extracts [pollen Extract] Other (See Comments)   Sneezing/watery eyes/itchy eye   Vicodin [hydrocodone-acetaminophen] Nausea And Vomiting      Medication List    STOP taking these medications   azithromycin 250 MG tablet Commonly known as:  Zithromax Z-Pak     TAKE these medications   acetaminophen 500 MG tablet Commonly known as:  TYLENOL Take 500-1,000 mg by mouth every 6 (six) hours as needed for mild pain or headache.   aspirin EC 81 MG tablet Take 1 tablet (81 mg total) by mouth daily.   benzonatate 100 MG capsule Commonly known as:  Tessalon Perles Take 1 capsule (100 mg total) by mouth every 6 (six) hours as needed for cough.   diclofenac sodium 1 % Gel Commonly known as:  VOLTAREN  Apply 2 g topically 2 (two) times daily as needed. What changed:  reasons to take this   hydrochlorothiazide 25 MG tablet Commonly known as:  HYDRODIURIL Take 25 mg by mouth daily.   isosorbide mononitrate 30 MG 24 hr tablet Commonly known as:  IMDUR Take 30 mg by mouth daily.   Lantus SoloStar 100 UNIT/ML Solostar  Pen Generic drug:  Insulin Glargine Inject 43 Units into the skin daily.   loratadine 10 MG tablet Commonly known as:  CLARITIN Take 10 mg by mouth daily.   losartan 25 MG tablet Commonly known as:  COZAAR Take 25 mg by mouth daily.   multivitamin with minerals Tabs tablet Take 1 tablet by mouth daily.   nortriptyline 10 MG capsule Commonly known as:  PAMELOR Take 30 mg by mouth at bedtime.   omeprazole 20 MG capsule Commonly known as:  PRILOSEC Take 20 mg by mouth daily.   ondansetron 4 MG disintegrating tablet Commonly known as:  Zofran ODT Take 1 tablet (4 mg total) by mouth every 8 (eight) hours as needed for nausea or vomiting.   oxyCODONE 5 MG immediate release tablet Commonly known as:  Roxicodone Take 1-2 tablets (5-10 mg total) by mouth every 4 (four) hours as needed.   ProAir HFA 108 (90 Base) MCG/ACT inhaler Generic drug:  albuterol Inhale 2 puffs into the lungs every 6 (six) hours as needed for wheezing or shortness of breath.   sitaGLIPtin 100 MG tablet Commonly known as:  JANUVIA Take 100 mg by mouth daily.   SUMAtriptan 100 MG tablet Commonly known as:  IMITREX Take 100 mg by mouth every 2 (two) hours as needed for migraine. May repeat in 2 hours if headache persists or recurs.   venlafaxine XR 150 MG 24 hr capsule Commonly known as:  EFFEXOR-XR Take 150 mg by mouth daily with breakfast.   Vitamin D3 50 MCG (2000 UT) Tabs Take 2,000 Units by mouth daily.        DISCHARGE INSTRUCTIONS:    If you experience worsening of your admission symptoms, develop shortness of breath, life threatening emergency, suicidal or homicidal thoughts you must seek medical attention immediately by calling 911 or calling your MD immediately  if symptoms less severe.  You Must read complete instructions/literature along with all the possible adverse reactions/side effects for all the Medicines you take and that have been prescribed to you. Take any new Medicines after  you have completely understood and accept all the possible adverse reactions/side effects.   Please note  You were cared for by a hospitalist during your hospital stay. If you have any questions about your discharge medications or the care you received while you were in the hospital after you are discharged, you can call the unit and asked to speak with the hospitalist on call if the hospitalist that took care of you is not available. Once you are discharged, your primary care physician will handle any further medical issues. Please note that NO REFILLS for any discharge medications will be authorized once you are discharged, as it is imperative that you return to your primary care physician (or establish a relationship with a primary care physician if you do not have one) for your aftercare needs so that they can reassess your need for medications and monitor your lab values.    Today   CHIEF COMPLAINT:   Chief Complaint  Patient presents with  . Altered Mental Status    HISTORY OF PRESENT ILLNESS:  VITAL SIGNS:  Blood pressure 131/87, pulse 85, temperature 98.3 F (36.8 C), temperature source Oral, resp. rate 18, height 5\' 1"  (1.549 m), weight 72.6 kg, SpO2 95 %.  I/O:    Intake/Output Summary (Last 24 hours) at 07/31/2018 1043 Last data filed at 07/31/2018 0900 Gross per 24 hour  Intake 720 ml  Output -  Net 720 ml    PHYSICAL EXAMINATION:  GENERAL:  73 y.o.-year-old patient lying in the bed with no acute distress.  EYES: Pupils equal, round, reactive to light and accommodation. No scleral icterus. Extraocular muscles intact.  HEENT: Head atraumatic, normocephalic. Oropharynx and nasopharynx clear.  NECK:  Supple, no jugular venous distention. No thyroid enlargement, no tenderness.  LUNGS: Normal breath sounds bilaterally, no wheezing, rales,rhonchi or crepitation. No use of accessory muscles of respiration.  CARDIOVASCULAR: S1, S2 normal. No murmurs, rubs, or gallops.   ABDOMEN: Soft, non-tender, non-distended. Bowel sounds present. No organomegaly or mass.  EXTREMITIES: No pedal edema, cyanosis, or clubbing.  NEUROLOGIC: Cranial nerves II through XII are intact. Muscle strength 5/5 in all extremities. Sensation intact. Gait not checked.  PSYCHIATRIC: The patient is alert and oriented x 3.  SKIN: No obvious rash, lesion, or ulcer.   DATA REVIEW:   CBC Recent Labs  Lab 07/30/18 0455  WBC 9.1  HGB 12.8  HCT 39.3  PLT 276    Chemistries  Recent Labs  Lab 07/29/18 1530 07/30/18 0455  NA 138 137  K 3.6 3.7  CL 101 102  CO2 28 28  GLUCOSE 206* 304*  BUN 19 20  CREATININE 1.37* 1.53*  CALCIUM 8.9 8.2*  AST 19  --   ALT 17  --   ALKPHOS 88  --   BILITOT 0.4  --     Cardiac Enzymes Recent Labs  Lab 07/26/18 1350  TROPONINI <0.03    Microbiology Results  Results for orders placed or performed during the hospital encounter of 07/29/18  Urine culture     Status: None   Collection Time: 07/29/18  8:37 PM  Result Value Ref Range Status   Specimen Description   Final    URINE, RANDOM Performed at [facility], [address] [place], [place] [address]    Special Requests   Final    NONE Performed at [facility], [address], [place], [place] [address]    Culture   Final    NO GROWTH Performed at [facility], [place] [address] [place], [place] [address]    Report Status 07/31/2018 FINAL  Final  SARS Coronavirus 2 [facility] order, Performed in [place] hospital lab)     Status: None   Collection Time: 07/29/18  9:33 PM  Result Value Ref Range Status   SARS Coronavirus 2 NEGATIVE NEGATIVE Final    Comment: (NOTE) If result is NEGATIVE SARS-CoV-2 target nucleic acids are NOT DETECTED. The SARS-CoV-2 RNA is generally detectable in upper and lower  respiratory specimens during the acute phase of infection. The lowest  concentration of SARS-CoV-2 viral copies this assay can detect is 250  copies /  mL. A negative result does not preclude SARS-CoV-2 infection  and should not be used as the sole basis for treatment or other  patient management decisions.  A negative result may occur with  improper specimen collection / handling, submission of specimen other  than nasopharyngeal swab, presence of viral mutation(s) within the  areas targeted by this assay, and inadequate number of viral copies  (<250  copies / mL). A negative result must be combined with clinical  observations, patient history, and epidemiological information. If result is POSITIVE SARS-CoV-2 target nucleic acids are DETECTED. The SARS-CoV-2 RNA is generally detectable in upper and lower  respiratory specimens dur ing the acute phase of infection.  Positive  results are indicative of active infection with SARS-CoV-2.  Clinical  correlation with patient history and other diagnostic information is  necessary to determine patient infection status.  Positive results do  not rule out bacterial infection or co-infection with other viruses. If result is PRESUMPTIVE POSTIVE SARS-CoV-2 nucleic acids MAY BE PRESENT.   A presumptive positive result was obtained on the submitted specimen  and confirmed on repeat testing.  While 2019 novel coronavirus  (SARS-CoV-2) nucleic acids may be present in the submitted sample  additional confirmatory testing may be necessary for epidemiological  and / or clinical management purposes  to differentiate between  SARS-CoV-2 and other Sarbecovirus currently known to infect humans.  If clinically indicated additional testing with an alternate test  methodology [id number]) is advised. The SARS-CoV-2 RNA is generally  detectable in upper and lower respiratory sp ecimens during the acute  phase of infection. The expected result is Negative. Fact Sheet for Patients:  StrictlyIdeas.no Fact Sheet for Healthcare Providers: BankingDealers.co.za This test is  not yet approved or cleared by the [place] FDA and has been authorized for detection and/or diagnosis of SARS-CoV-2 by FDA under an Emergency Use Authorization (EUA).  This EUA will remain in effect (meaning this test can be used) for the duration of the COVID-19 declaration under Section 564(b)(1) of the Act, 21 U.S.C. section 360bbb-3(b)(1), unless the authorization is terminated or revoked sooner. Performed at [facility], [address] [place], [place] [address]     RADIOLOGY:  Ct Head Wo Contrast  Result Date: 07/29/2018 CLINICAL DATA:  Headache, dizziness. EXAM: CT HEAD WITHOUT CONTRAST TECHNIQUE: Contiguous axial images were obtained from the base of the skull through the vertex without intravenous contrast. COMPARISON:  CT scan of January 05, 2015. FINDINGS: Brain: Mild chronic ischemic white matter disease is noted. No mass effect or midline shift is noted. Ventricular size is within normal limits. There is no evidence of mass lesion, hemorrhage or acute infarction. Vascular: No hyperdense vessel or unexpected calcification. Skull: Normal. Negative for fracture or focal lesion. Sinuses/Orbits: No acute finding. Other: None. IMPRESSION: Mild chronic ischemic white matter disease. No acute intracranial abnormality seen. Electronically Signed   By: Marijo Conception M.D.   On: 07/29/2018 16:01   Mr Brain Wo Contrast  Result Date: 07/29/2018 CLINICAL DATA:  Initial evaluation for acute encephalopathy, increased forgetfulness, headache, dizziness. EXAM: MRI HEAD WITHOUT CONTRAST TECHNIQUE: Multiplanar, multiecho pulse sequences of the brain and surrounding structures were obtained without intravenous contrast. COMPARISON:  Prior CT from earlier same day. FINDINGS: Brain: Cerebral volume within normal limits for patient age. Scattered patchy T2/FLAIR hyperintensity within the periventricular and deep white matter both cerebral hemispheres most consistent with chronic small  vessel ischemic disease, mild in nature. No abnormal foci of restricted diffusion to suggest acute or subacute ischemia. Gray-white matter differentiation well maintained. No encephalomalacia to suggest chronic infarction. No foci of susceptibility artifact to suggest acute or chronic intracranial hemorrhage. No mass lesion, midline shift or mass effect. No hydrocephalus. No extra-axial fluid collection. Major dural sinuses are grossly patent. Note made of a partially empty sella. Midline structures intact and normal. Vascular: Major intracranial vascular flow voids well maintained and normal  in appearance. Skull and upper cervical spine: Craniocervical junction normal. Multilevel degenerative spondylolysis noted within the upper cervical spine without significant stenosis. Bone marrow signal intensity normal. No scalp soft tissue abnormality. Sinuses/Orbits: Globes and orbital soft tissues within normal limits. Paranasal sinuses are clear. No mastoid effusion. Inner ear structures normal. Other: None. IMPRESSION: 1. No acute intracranial abnormality identified. No findings to explain patient's symptoms. 2. Generalized age appropriate cerebral volume loss with mild chronic small vessel ischemic disease. Electronically Signed   By: Jeannine Boga M.D.   On: 07/29/2018 19:41   US Carotid Bilateral  Result Date: 07/30/2018 CLINICAL DATA:  Headache.  Dizziness.  Increased forgetfulness. EXAM: BILATERAL CAROTID DUPLEX ULTRASOUND TECHNIQUE: Pearline Cables scale imaging, color Doppler and duplex ultrasound were performed of bilateral carotid and vertebral arteries in the neck. COMPARISON:  12/20/2009 FINDINGS: Criteria: Quantification of carotid stenosis is based on velocity parameters that correlate the residual internal carotid diameter with NASCET-based stenosis levels, using the diameter of the distal internal carotid lumen as the denominator for stenosis measurement. The following velocity measurements were obtained:  RIGHT ICA: 65 cm/sec CCA: 50 cm/sec SYSTOLIC ICA/CCA RATIO:  1.3 ECA: 57 cm/sec LEFT ICA: 39 cm/sec CCA: 64 cm/sec SYSTOLIC ICA/CCA RATIO:  0.6 ECA: 55 cm/sec RIGHT CAROTID ARTERY: Little if any plaque in the bulb. Low resistance internal carotid Doppler pattern is preserved. RIGHT VERTEBRAL ARTERY:  Antegrade. LEFT CAROTID ARTERY: Little if any plaque in the bulb. Low resistance internal carotid Doppler pattern is preserved. LEFT VERTEBRAL ARTERY:  Antegrade. IMPRESSION: Less than 50% stenosis in the right and left internal carotid arteries. Electronically Signed   By: Marybelle Killings M.D.   On: 07/30/2018 11:27    EKG:   Orders placed or performed during the hospital encounter of 07/29/18  . ED EKG  . ED EKG      Management plans discussed with the patient, family and they are in agreement.  CODE STATUS:     Code Status Orders  (From admission, onward)         Start     Ordered   07/29/18 2130  Full code  Continuous     07/29/18 2129        Code Status History    Date Active Date Inactive Code Status Order ID Comments User Context   04/25/2017 1744 04/25/2017 2119 Full Code [id number]  Poggi, Marshall Cork, MD Inpatient      TOTAL TIME TAKING CARE OF THIS PATIENT: 35 minutes.    Avel Peace Cheyrl Buley M.D on 07/31/2018 at 10:43 AM  Between 7am to 6pm - Pager - [phone]  After 6pm go to [email or web address] - password [email or web address]  Office  [phone]  CC: Primary care physician; Center, [facility]   Note: This dictation was prepared with Dragon dictation along with smaller phrase technology. Any transcriptional errors that result from this process are unintentional.

## 2018-07-31 NOTE — Progress Notes (Signed)
Discharge instructions and prescriptions reviewed with pt, states understanding, pt with no complaints

## 2018-08-02 ENCOUNTER — Encounter: Payer: Self-pay | Admitting: Emergency Medicine

## 2018-08-02 ENCOUNTER — Other Ambulatory Visit: Payer: Self-pay

## 2018-08-02 ENCOUNTER — Emergency Department: Payer: Medicare Other

## 2018-08-02 ENCOUNTER — Emergency Department
Admission: EM | Admit: 2018-08-02 | Discharge: 2018-08-02 | Disposition: A | Discharge status: Home / Self Care | Payer: Medicare Other | Attending: Emergency Medicine | Admitting: Emergency Medicine

## 2018-08-02 DIAGNOSIS — I129 Hypertensive chronic kidney disease with stage 1 through stage 4 chronic kidney disease, or unspecified chronic kidney disease: Secondary | ICD-10-CM | POA: Insufficient documentation

## 2018-08-02 DIAGNOSIS — M722 Plantar fascial fibromatosis: Secondary | ICD-10-CM | POA: Diagnosis not present

## 2018-08-02 DIAGNOSIS — Z79899 Other long term (current) drug therapy: Secondary | ICD-10-CM | POA: Diagnosis not present

## 2018-08-02 DIAGNOSIS — Z794 Long term (current) use of insulin: Secondary | ICD-10-CM | POA: Diagnosis not present

## 2018-08-02 DIAGNOSIS — N183 Chronic kidney disease, stage 3 (moderate): Secondary | ICD-10-CM | POA: Diagnosis not present

## 2018-08-02 DIAGNOSIS — M79671 Pain in right foot: Secondary | ICD-10-CM | POA: Diagnosis present

## 2018-08-02 DIAGNOSIS — J45909 Unspecified asthma, uncomplicated: Secondary | ICD-10-CM | POA: Insufficient documentation

## 2018-08-02 DIAGNOSIS — E1122 Type 2 diabetes mellitus with diabetic chronic kidney disease: Secondary | ICD-10-CM | POA: Diagnosis not present

## 2018-08-02 MED ORDER — PREDNISONE 20 MG PO TABS
60.0000 mg | ORAL_TABLET | Freq: Once | ORAL | Status: AC
  Administered 2018-08-02: 60 mg via ORAL
  Filled 2018-08-02: qty 3

## 2018-08-02 MED ORDER — OXYCODONE-ACETAMINOPHEN 5-325 MG PO TABS
1.0000 | ORAL_TABLET | Freq: Once | ORAL | Status: AC
  Administered 2018-08-02: 1 via ORAL
  Filled 2018-08-02: qty 1

## 2018-08-02 MED ORDER — ONDANSETRON 8 MG PO TBDP
8.0000 mg | ORAL_TABLET | Freq: Once | ORAL | Status: AC
  Administered 2018-08-02: 8 mg via ORAL
  Filled 2018-08-02: qty 1

## 2018-08-02 MED ORDER — TRAMADOL HCL 50 MG PO TABS: 50.0000 mg | ORAL_TABLET | Freq: Four times a day (QID) | ORAL | 0 refills | Status: DC | PRN

## 2018-08-02 MED ORDER — PREDNISONE 10 MG PO TABS: 10.0000 mg | ORAL_TABLET | Freq: Every day | ORAL | 0 refills | Status: DC

## 2018-08-02 NOTE — ED Triage Notes (Signed)
States pain bottom of R foot since yesterday. Denies injury.

## 2018-08-02 NOTE — ED Provider Notes (Signed)
[facility] Emergency Department Provider Note  ____________________________________________  Time seen: Approximately 10:17 PM  I have reviewed the triage vital signs and the nursing notes.   HISTORY  Chief Complaint Foot Pain    HPI Theresa Booker is a 73 y.o. female who presents the emergency department complaining of pain to the sole of the right foot.  Patient reports that she has had no trauma but feels like she has a burning sensation/sharp sensation/stepping on a rock sensation when walking to the right foot.  No other pain complaint.  No gross edema to the area.  Patient denies stepping on any foreign body.  No medications for his complaint prior to arrival.         Past Medical History:  Diagnosis Date  . Asthma   . Atypical chest pain    neg ETT at [facility]  . CKD (chronic kidney disease), stage III ([place])   . Depression   . Diabetes mellitus without complication ([place])   . Fibromyalgia   . HLD (hyperlipidemia)   . Hypertension   . Vitamin D deficiency     Patient Active Problem List   Diagnosis Date Noted  . AMS (altered mental status) 07/30/2018  . Altered mental status 07/29/2018  . Cocaine use 05/03/2015  . Abnormal MRI, lumbar spine 05/03/2015  . Numbness in feet 04/29/2015  . Gait instability 04/29/2015  . BP (high blood pressure) 04/13/2015  . Fibromyalgia 04/13/2015  . Essential (primary) hypertension 04/13/2015  . Clinical depression 04/13/2015  . Chronic kidney disease (CKD), stage III (moderate) ([place]) 04/13/2015  . Airway hyperreactivity 04/13/2015  . Chronic knee pain (Right) 04/13/2015  . Chronic low back pain (Location of Secondary source of pain) (Bilateral) (R>L) 04/13/2015  . Chronic lower straight pain (Location of Primary Source of Pain) (Right) 04/13/2015  . Chronic lumbar radicular pain (Right) (L5 Dermatome) 04/13/2015  . At high risk for falls 04/13/2015  . Chronic hip pain (Right) 04/13/2015  . Chronic  sacroiliac joint pain (Bilateral) (R>L) 04/13/2015  . Controlled type 2 diabetes mellitus without complication ([place]) 01/60/1093  . Headache disorder 02/01/2015  . Dizziness 02/01/2015  . Bad posture 02/01/2015  . Episodic tension type headache 01/10/2015  . Amnesia 10/18/2014  . 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 Acid reflux 09/24/2013  . Chronic pain 09/24/2013  . Avitaminosis D 02/11/2013    Past Surgical History:  Procedure Laterality Date  . ABDOMINAL HYSTERECTOMY    . COLONOSCOPY WITH PROPOFOL N/A 05/22/2016   Procedure: COLONOSCOPY WITH PROPOFOL;  Surgeon: Lollie Sails, MD;  Location: [facility] ENDOSCOPY;  Service: Endoscopy;  Laterality: N/A;  . ESOPHAGOGASTRODUODENOSCOPY (EGD) WITH PROPOFOL N/A 05/22/2016   Procedure: ESOPHAGOGASTRODUODENOSCOPY (EGD) WITH PROPOFOL;  Surgeon: Lollie Sails, MD;  Location: [facility] ENDOSCOPY;  Service: Endoscopy;  Laterality: N/A;  . HAND SURGERY Right 2014   Dr Rudene Christians  . rotator cuff surgery Left 03/2013   DR Rudene Christians  . SHOULDER ARTHROSCOPY WITH OPEN ROTATOR CUFF REPAIR Right 04/25/2017   Procedure: SHOULDER ARTHROSCOPY WITH OPEN ROTATOR CUFF REPAIR;  Surgeon: Corky Mull, MD;  Location: [facility] ORS;  Service: Orthopedics;  Laterality: Right;  . SHOULDER ARTHROSCOPY WITH OPEN ROTATOR CUFF REPAIR Right 03/11/2018   Procedure: SHOULDER ARTHROSCOPY WITH Recurrent  OPEN ROTATOR CUFF REPAIR;  Surgeon: Corky Mull, MD;  Location: [facility] ORS;  Service: Orthopedics;  Laterality: Right;  debridment and decompression    Prior to Admission medications   Medication Sig Start Date End Date  Taking? Authorizing Provider  acetaminophen (TYLENOL) 500 MG tablet Take 500-1,000 mg by mouth every 6 (six) hours as needed for mild pain or headache.     [provider]  albuterol (PROAIR HFA) 108 (90 BASE) MCG/ACT inhaler Inhale 2 puffs into the lungs every 6 (six) hours as needed for wheezing or shortness of breath.     [provider]  aspirin EC 81 MG tablet Take 1 tablet (81 mg total) by mouth daily. 07/30/18 07/30/19  Salary, Avel Peace, MD  benzonatate (TESSALON PERLES) 100 MG capsule Take 1 capsule (100 mg total) by mouth every 6 (six) hours as needed for cough. 07/22/18 07/22/19  Harvest Dark, MD  Cholecalciferol (VITAMIN D3) 50 MCG (2000 UT) TABS Take 2,000 Units by mouth daily.    [provider]  diclofenac sodium (VOLTAREN) 1 % GEL Apply 2 g topically 2 (two) times daily as needed. Patient taking differently: Apply 2 g topically 2 (two) times daily as needed (for pain).  09/30/17   Merlyn Lot, MD  hydrochlorothiazide (HYDRODIURIL) 25 MG tablet Take 25 mg by mouth daily.     [provider]  Insulin Glargine (LANTUS SOLOSTAR) 100 UNIT/ML Solostar Pen Inject 43 Units into the skin daily.    [provider]  isosorbide mononitrate (IMDUR) 30 MG 24 hr tablet Take 30 mg by mouth daily.    [provider]  loratadine (CLARITIN) 10 MG tablet Take 10 mg by mouth daily.    [provider]  losartan (COZAAR) 25 MG tablet Take 25 mg by mouth daily.    [provider]  Multiple Vitamin (MULTIVITAMIN WITH MINERALS) TABS tablet Take 1 tablet by mouth daily. 07/31/18   Salary, Avel Peace, MD  nortriptyline (PAMELOR) 10 MG capsule Take 30 mg by mouth at bedtime.     [provider]  omeprazole (PRILOSEC) 20 MG capsule Take 20 mg by mouth daily.     [provider]  ondansetron (ZOFRAN ODT) 4 MG disintegrating tablet Take 1 tablet (4 mg total) by mouth every 8 (eight) hours as needed for nausea or vomiting. 03/11/18   Poggi, Marshall Cork, MD  oxyCODONE (ROXICODONE) 5 MG immediate release tablet Take 1-2 tablets (5-10 mg total) by mouth every 4 (four) hours as needed. 03/11/18   Poggi, Marshall Cork, MD  predniSONE (DELTASONE) 10 MG tablet Take 1 tablet (10 mg total) by mouth daily. 08/02/18   Aloma Boch, Charline Bills, PA-C  sitaGLIPtin (JANUVIA) 100 MG tablet Take  100 mg by mouth daily.    [provider]  SUMAtriptan (IMITREX) 100 MG tablet Take 100 mg by mouth every 2 (two) hours as needed for migraine. May repeat in 2 hours if headache persists or recurs.    [provider]  traMADol (ULTRAM) 50 MG tablet Take 1 tablet (50 mg total) by mouth every 6 (six) hours as needed. 08/02/18   Zyan Mirkin, Charline Bills, PA-C  venlafaxine XR (EFFEXOR-XR) 150 MG 24 hr capsule Take 150 mg by mouth daily with breakfast.     [provider]    Allergies Ace inhibitors; Pollen extracts [pollen extract]; and Vicodin [hydrocodone-acetaminophen]  Family History  Problem Relation Age of Onset  . Diabetes Mother   . Breast cancer Mother 23  . Cancer Sister   . Cancer Father   . Liver disease Brother   . Breast cancer Maternal Aunt   . Breast cancer Cousin        maternal    Social History Social  History   Tobacco Use  . Smoking status: Never Smoker  . Smokeless tobacco: Never Used  Substance Use Topics  . Alcohol use: No    Alcohol/week: 0.0 standard drinks  . Drug use: No     Review of Systems  Constitutional: No fever/chills Eyes: No visual changes. No discharge ENT: No upper respiratory complaints. Cardiovascular: no chest pain. Respiratory: no cough. No SOB. Gastrointestinal: No abdominal pain.  No nausea, no vomiting.   Musculoskeletal: Positive for right foot pain Skin: Negative for rash, abrasions, lacerations, ecchymosis. Neurological: Negative for headaches, focal weakness or numbness. 10-point ROS otherwise negative.  ____________________________________________   PHYSICAL EXAM:  VITAL SIGNS: ED Triage Vitals  Enc Vitals Group     BP 08/02/18 1746 124/76     Pulse Rate 08/02/18 1746 86     Resp 08/02/18 1746 18     Temp 08/02/18 1746 98.2 F (36.8 C)     Temp Source 08/02/18 1746 Oral     SpO2 08/02/18 1746 96 %     Weight 08/02/18 1747 160 lb (72.6 kg)     Height 08/02/18 1747 5\' 1"  (1.549 m)      Head Circumference --      Peak Flow --      Pain Score 08/02/18 1746 10     Pain Loc --      Pain Edu? --      Excl. in [place]? --      Constitutional: Alert and oriented. Well appearing and in no acute distress. Eyes: Conjunctivae are normal. PERRL. EOMI. Head: Atraumatic. Neck: No stridor.    Cardiovascular: Normal rate, regular rhythm. Normal S1 and S2.  Good peripheral circulation. Respiratory: Normal respiratory effort without tachypnea or retractions. Lungs CTAB. Good air entry to the bases with no decreased or absent breath sounds. Musculoskeletal: Full range of motion to all extremities. No gross deformities appreciated.  Visualization of the plantar aspect of the foot reveals no wounds.  No ecchymosis.  No edema or erythema.  Patient is very tender to palpation along the plantar fascia distribution.  No palpable abnormality.  No other gross tenderness to the osseous or muscular structures of the right foot.  Dorsalis pedis pulse intact.  Sensation intact all digits. Neurologic:  Normal speech and language. No gross focal neurologic deficits are appreciated.  Skin:  Skin is warm, dry and intact. No rash noted. Psychiatric: Mood and affect are normal. Speech and behavior are normal. Patient exhibits appropriate insight and judgement.   ____________________________________________   LABS (all labs ordered are listed, but only abnormal results are displayed)  Labs Reviewed - No data to display ____________________________________________  EKG   ____________________________________________  RADIOLOGY I personally viewed and evaluated these images as part of my medical decision making, as well as reviewing the written report by the radiologist.  Dg Foot Complete Right  Result Date: 08/02/2018 CLINICAL DATA:  Right foot pain EXAM: RIGHT FOOT COMPLETE - 3+ VIEW COMPARISON:  None. FINDINGS: Mild degenerative changes and hallux valgus deformity at the 1st MTP joint. No acute bony  abnormality. Specifically, no fracture, subluxation, or dislocation. Plantar calcaneal spur. IMPRESSION: First MTP degenerative changes/hallux valgus deformity. Plantar calcaneal spur. No acute findings. Electronically Signed   By: Rolm Baptise M.D.   On: 08/02/2018 18:54    ____________________________________________    PROCEDURES  Procedure(s) performed:    Procedures    Medications  predniSONE (DELTASONE) tablet 60 mg (has no administration in time range)  oxyCODONE-acetaminophen (PERCOCET/ROXICET) 5-325 MG  per tablet 1 tablet (has no administration in time range)  ondansetron (ZOFRAN-ODT) disintegrating tablet 8 mg (has no administration in time range)     ____________________________________________   INITIAL IMPRESSION / ASSESSMENT AND PLAN / ED COURSE  Pertinent labs & imaging results that were available during my care of the patient were reviewed by me and considered in my medical decision making (see chart for details).  Review of the [place] CSRS was performed in accordance of the [place] prior to dispensing any controlled drugs.           Patient's diagnosis is consistent with plantar fasciitis.  Patient presented to the emergency department complaining of pain to the sole of the right foot.  X-ray reveals no fractures.  Patient does have calcaneal spur.  Findings on exam are most consistent with plantar fasciitis.  Patient will be placed on course of steroid, limited pain medication and follow-up podiatry..  Patient is given ED precautions to return to the ED for any worsening or new symptoms.     ____________________________________________  FINAL CLINICAL IMPRESSION(S) / ED DIAGNOSES  Final diagnoses:  Plantar fasciitis of left foot      NEW MEDICATIONS STARTED DURING THIS VISIT:  ED Discharge Orders         Ordered    traMADol (ULTRAM) 50 MG tablet  Every 6 hours PRN     08/02/18 2231    predniSONE (DELTASONE) 10 MG tablet  Daily    Note to Pharmacy:   Take 6 pills x 2 days, 5 pills x 2 days, 4 pills x 2 days, 3 pills x 2 days, 2 pills x 2 days, and 1 pill x 2 days   08/02/18 2231              This chart was dictated using voice recognition software/Dragon. Despite best efforts to proofread, errors can occur which can change the meaning. Any change was purely unintentional.    Darletta Moll, PA-C 08/02/18 2231    Carrie Mew, MD 08/02/18 2358

## 2018-08-07 ENCOUNTER — Inpatient Hospital Stay
Admission: EM | Admit: 2018-08-07 | Discharge: 2018-08-11 | DRG: 872 | Disposition: A | Discharge status: Home / Self Care | Payer: Medicare Other | Attending: Internal Medicine | Admitting: Internal Medicine

## 2018-08-07 ENCOUNTER — Other Ambulatory Visit: Payer: Self-pay

## 2018-08-07 ENCOUNTER — Emergency Department: Payer: Medicare Other

## 2018-08-07 DIAGNOSIS — A419 Sepsis, unspecified organism: Principal | ICD-10-CM | POA: Diagnosis present

## 2018-08-07 DIAGNOSIS — J9811 Atelectasis: Secondary | ICD-10-CM | POA: Diagnosis present

## 2018-08-07 DIAGNOSIS — E1122 Type 2 diabetes mellitus with diabetic chronic kidney disease: Secondary | ICD-10-CM | POA: Diagnosis present

## 2018-08-07 DIAGNOSIS — E1165 Type 2 diabetes mellitus with hyperglycemia: Secondary | ICD-10-CM | POA: Diagnosis present

## 2018-08-07 DIAGNOSIS — M722 Plantar fascial fibromatosis: Secondary | ICD-10-CM | POA: Diagnosis present

## 2018-08-07 DIAGNOSIS — Z833 Family history of diabetes mellitus: Secondary | ICD-10-CM

## 2018-08-07 DIAGNOSIS — Z79891 Long term (current) use of opiate analgesic: Secondary | ICD-10-CM

## 2018-08-07 DIAGNOSIS — I129 Hypertensive chronic kidney disease with stage 1 through stage 4 chronic kidney disease, or unspecified chronic kidney disease: Secondary | ICD-10-CM | POA: Diagnosis present

## 2018-08-07 DIAGNOSIS — N390 Urinary tract infection, site not specified: Secondary | ICD-10-CM | POA: Diagnosis present

## 2018-08-07 DIAGNOSIS — E872 Acidosis: Secondary | ICD-10-CM | POA: Diagnosis present

## 2018-08-07 DIAGNOSIS — E11649 Type 2 diabetes mellitus with hypoglycemia without coma: Secondary | ICD-10-CM | POA: Diagnosis present

## 2018-08-07 DIAGNOSIS — J45909 Unspecified asthma, uncomplicated: Secondary | ICD-10-CM | POA: Diagnosis present

## 2018-08-07 DIAGNOSIS — E559 Vitamin D deficiency, unspecified: Secondary | ICD-10-CM | POA: Diagnosis present

## 2018-08-07 DIAGNOSIS — Z885 Allergy status to narcotic agent status: Secondary | ICD-10-CM

## 2018-08-07 DIAGNOSIS — E860 Dehydration: Secondary | ICD-10-CM | POA: Diagnosis present

## 2018-08-07 DIAGNOSIS — Z20828 Contact with and (suspected) exposure to other viral communicable diseases: Secondary | ICD-10-CM | POA: Diagnosis present

## 2018-08-07 DIAGNOSIS — R410 Disorientation, unspecified: Secondary | ICD-10-CM

## 2018-08-07 DIAGNOSIS — N179 Acute kidney failure, unspecified: Secondary | ICD-10-CM | POA: Diagnosis present

## 2018-08-07 DIAGNOSIS — E785 Hyperlipidemia, unspecified: Secondary | ICD-10-CM | POA: Diagnosis present

## 2018-08-07 DIAGNOSIS — R42 Dizziness and giddiness: Secondary | ICD-10-CM

## 2018-08-07 DIAGNOSIS — R739 Hyperglycemia, unspecified: Secondary | ICD-10-CM

## 2018-08-07 DIAGNOSIS — Z7982 Long term (current) use of aspirin: Secondary | ICD-10-CM

## 2018-08-07 DIAGNOSIS — Z803 Family history of malignant neoplasm of breast: Secondary | ICD-10-CM | POA: Diagnosis not present

## 2018-08-07 DIAGNOSIS — M797 Fibromyalgia: Secondary | ICD-10-CM | POA: Diagnosis present

## 2018-08-07 DIAGNOSIS — Z794 Long term (current) use of insulin: Secondary | ICD-10-CM

## 2018-08-07 DIAGNOSIS — Z888 Allergy status to other drugs, medicaments and biological substances status: Secondary | ICD-10-CM | POA: Diagnosis not present

## 2018-08-07 DIAGNOSIS — N183 Chronic kidney disease, stage 3 (moderate): Secondary | ICD-10-CM | POA: Diagnosis present

## 2018-08-07 DIAGNOSIS — Z90710 Acquired absence of both cervix and uterus: Secondary | ICD-10-CM

## 2018-08-07 LAB — COMPREHENSIVE METABOLIC PANEL
ALT: 20 U/L (ref 0–44)
AST: 21 U/L (ref 15–41)
Albumin: 3.4 g/dL — ABNORMAL LOW (ref 3.5–5.0)
Alkaline Phosphatase: 135 U/L — ABNORMAL HIGH (ref 38–126)
Anion gap: 17 — ABNORMAL HIGH (ref 5–15)
BUN: 40 mg/dL — ABNORMAL HIGH (ref 8–23)
CO2: 19 mmol/L — ABNORMAL LOW (ref 22–32)
Calcium: 8.8 mg/dL — ABNORMAL LOW (ref 8.9–10.3)
Chloride: 96 mmol/L — ABNORMAL LOW (ref 98–111)
Creatinine, Ser: 1.66 mg/dL — ABNORMAL HIGH (ref 0.44–1.00)
GFR calc Af Amer: 35 mL/min — ABNORMAL LOW (ref >60)
GFR calc non Af Amer: 30 mL/min — ABNORMAL LOW (ref >60)
Glucose, Bld: 510 mg/dL (ref 70–99)
Potassium: 4.5 mmol/L (ref 3.5–5.1)
Sodium: 132 mmol/L — ABNORMAL LOW (ref 135–145)
Total Bilirubin: 0.3 mg/dL (ref 0.3–1.2)
Total Protein: 7.3 g/dL (ref 6.5–8.1)

## 2018-08-07 LAB — BASIC METABOLIC PANEL
Anion gap: 10 (ref 5–15)
BUN: 36 mg/dL — ABNORMAL HIGH (ref 8–23)
CO2: 26 mmol/L (ref 22–32)
Calcium: 8.9 mg/dL (ref 8.9–10.3)
Chloride: 98 mmol/L (ref 98–111)
Creatinine, Ser: 1.62 mg/dL — ABNORMAL HIGH (ref 0.44–1.00)
GFR calc Af Amer: 36 mL/min — ABNORMAL LOW (ref >60)
GFR calc non Af Amer: 31 mL/min — ABNORMAL LOW (ref >60)
Glucose, Bld: 311 mg/dL — ABNORMAL HIGH (ref 70–99)
Potassium: 4.5 mmol/L (ref 3.5–5.1)
Sodium: 134 mmol/L — ABNORMAL LOW (ref 135–145)

## 2018-08-07 LAB — GLUCOSE, CAPILLARY
Glucose-Capillary: 488 mg/dL — ABNORMAL HIGH (ref 70–99)
Glucose-Capillary: 262 mg/dL — ABNORMAL HIGH (ref 70–99)
Glucose-Capillary: 277 mg/dL — ABNORMAL HIGH (ref 70–99)
Glucose-Capillary: 320 mg/dL — ABNORMAL HIGH (ref 70–99)

## 2018-08-07 LAB — URINALYSIS, COMPLETE (UACMP) WITH MICROSCOPIC
Bilirubin Urine: NEGATIVE
Glucose, UA: >=500 mg/dL — AB
Hgb urine dipstick: NEGATIVE
Ketones, ur: NEGATIVE mg/dL
Leukocytes,Ua: NEGATIVE
Nitrite: NEGATIVE
Protein, ur: 30 mg/dL — AB
Specific Gravity, Urine: 1.021 (ref 1.005–1.030)
pH: 6 (ref 5.0–8.0)

## 2018-08-07 LAB — CBC WITH DIFFERENTIAL/PLATELET
Abs Immature Granulocytes: 0.16 10*3/uL — ABNORMAL HIGH (ref 0.00–0.07)
Basophils Absolute: 0.1 10*3/uL (ref 0.0–0.1)
Basophils Relative: 0 %
Eosinophils Absolute: 0 10*3/uL (ref 0.0–0.5)
Eosinophils Relative: 0 %
HCT: 43.6 % (ref 36.0–46.0)
Hemoglobin: 13.8 g/dL (ref 12.0–15.0)
Immature Granulocytes: 1 %
Lymphocytes Relative: 11 %
Lymphs Abs: 1.8 10*3/uL (ref 0.7–4.0)
MCH: 29.1 pg (ref 26.0–34.0)
MCHC: 31.7 g/dL (ref 30.0–36.0)
MCV: 92 fL (ref 80.0–100.0)
Monocytes Absolute: 1 10*3/uL (ref 0.1–1.0)
Monocytes Relative: 6 %
Neutro Abs: 12.8 10*3/uL — ABNORMAL HIGH (ref 1.7–7.7)
Neutrophils Relative %: 82 %
Platelets: 350 10*3/uL (ref 150–400)
RBC: 4.74 MIL/uL (ref 3.87–5.11)
RDW: 13.7 % (ref 11.5–15.5)
WBC: 15.8 10*3/uL — ABNORMAL HIGH (ref 4.0–10.5)
nRBC: 0 % (ref 0.0–0.2)

## 2018-08-07 LAB — TSH: TSH: 0.472 u[IU]/mL (ref 0.350–4.500)

## 2018-08-07 LAB — SARS CORONAVIRUS 2 BY RT PCR (HOSPITAL ORDER, PERFORMED IN ~~LOC~~ HOSPITAL LAB): SARS Coronavirus 2: NEGATIVE

## 2018-08-07 LAB — LACTIC ACID, PLASMA
Lactic Acid, Venous: 3.3 mmol/L (ref 0.5–1.9)
Lactic Acid, Venous: 2.3 mmol/L (ref 0.5–1.9)
Lactic Acid, Venous: 2 mmol/L (ref 0.5–1.9)
Lactic Acid, Venous: 2.1 mmol/L (ref 0.5–1.9)

## 2018-08-07 LAB — TROPONIN I: Troponin I: <0.03 ng/mL (ref <0.03)

## 2018-08-07 MED ORDER — LINAGLIPTIN 5 MG PO TABS
5.0000 mg | ORAL_TABLET | Freq: Every day | ORAL | Status: DC
  Administered 2018-08-08 – 2018-08-11 (×4): 5 mg via ORAL
  Filled 2018-08-07 (×4): qty 1

## 2018-08-07 MED ORDER — ISOSORBIDE MONONITRATE ER 30 MG PO TB24
30.0000 mg | ORAL_TABLET | Freq: Every day | ORAL | Status: DC
  Administered 2018-08-08 – 2018-08-11 (×4): 30 mg via ORAL
  Filled 2018-08-07 (×4): qty 1

## 2018-08-07 MED ORDER — ACETAMINOPHEN 650 MG RE SUPP: 650.0000 mg | Freq: Four times a day (QID) | RECTAL | Status: DC | PRN

## 2018-08-07 MED ORDER — HYDROCHLOROTHIAZIDE 25 MG PO TABS
25.0000 mg | ORAL_TABLET | Freq: Every day | ORAL | Status: DC
  Administered 2018-08-08 – 2018-08-11 (×4): 25 mg via ORAL
  Filled 2018-08-07 (×4): qty 1

## 2018-08-07 MED ORDER — PANTOPRAZOLE SODIUM 40 MG PO TBEC
40.0000 mg | DELAYED_RELEASE_TABLET | Freq: Every day | ORAL | Status: DC
  Administered 2018-08-08 – 2018-08-11 (×4): 40 mg via ORAL
  Filled 2018-08-07 (×4): qty 1

## 2018-08-07 MED ORDER — VANCOMYCIN HCL 10 G IV SOLR
1500.0000 mg | Freq: Once | INTRAVENOUS | Status: AC
  Administered 2018-08-07: 1500 mg via INTRAVENOUS
  Filled 2018-08-07: qty 1500

## 2018-08-07 MED ORDER — SODIUM CHLORIDE 0.9% FLUSH
3.0000 mL | Freq: Two times a day (BID) | INTRAVENOUS | Status: DC
  Administered 2018-08-08 – 2018-08-10 (×5): 3 mL via INTRAVENOUS

## 2018-08-07 MED ORDER — SODIUM CHLORIDE 0.9 % IV SOLN: 2.0000 g | Freq: Once | INTRAVENOUS | Status: DC

## 2018-08-07 MED ORDER — VITAMIN B-1 100 MG PO TABS
100.0000 mg | ORAL_TABLET | Freq: Every day | ORAL | Status: DC
  Administered 2018-08-08 – 2018-08-11 (×5): 100 mg via ORAL
  Filled 2018-08-07 (×5): qty 1

## 2018-08-07 MED ORDER — INSULIN ASPART 100 UNIT/ML ~~LOC~~ SOLN
10.0000 [IU] | Freq: Once | SUBCUTANEOUS | Status: AC
  Administered 2018-08-07: 13:00:00 10 [IU] via INTRAVENOUS
  Filled 2018-08-07: qty 1

## 2018-08-07 MED ORDER — SODIUM CHLORIDE 0.9 % IV SOLN
INTRAVENOUS | Status: DC
  Administered 2018-08-08 – 2018-08-10 (×4): via INTRAVENOUS

## 2018-08-07 MED ORDER — SODIUM CHLORIDE 0.9 % IV BOLUS
1000.0000 mL | Freq: Once | INTRAVENOUS | Status: AC
  Administered 2018-08-07: 1000 mL via INTRAVENOUS

## 2018-08-07 MED ORDER — ADULT MULTIVITAMIN W/MINERALS CH
1.0000 | ORAL_TABLET | Freq: Every day | ORAL | Status: DC
  Administered 2018-08-08 – 2018-08-11 (×4): 1 via ORAL
  Filled 2018-08-07 (×4): qty 1

## 2018-08-07 MED ORDER — VANCOMYCIN HCL 10 G IV SOLR: 1250.0000 mg | INTRAVENOUS | Status: DC

## 2018-08-07 MED ORDER — INSULIN GLARGINE 100 UNIT/ML ~~LOC~~ SOLN
43.0000 [IU] | Freq: Every day | SUBCUTANEOUS | Status: DC
  Administered 2018-08-08 – 2018-08-10 (×3): 43 [IU] via SUBCUTANEOUS
  Filled 2018-08-07 (×4): qty 0.43

## 2018-08-07 MED ORDER — ASPIRIN EC 81 MG PO TBEC
81.0000 mg | DELAYED_RELEASE_TABLET | Freq: Every day | ORAL | Status: DC
  Administered 2018-08-08 – 2018-08-11 (×4): 81 mg via ORAL
  Filled 2018-08-07 (×4): qty 1

## 2018-08-07 MED ORDER — NORTRIPTYLINE HCL 10 MG PO CAPS
30.0000 mg | ORAL_CAPSULE | Freq: Every day | ORAL | Status: DC
  Administered 2018-08-08 – 2018-08-10 (×4): 30 mg via ORAL
  Filled 2018-08-07 (×5): qty 3

## 2018-08-07 MED ORDER — SODIUM CHLORIDE 0.9 % IV SOLN
1.0000 g | Freq: Two times a day (BID) | INTRAVENOUS | Status: DC
  Administered 2018-08-08 (×2): 1 g via INTRAVENOUS
  Filled 2018-08-07 (×3): qty 1

## 2018-08-07 MED ORDER — ENOXAPARIN SODIUM 40 MG/0.4ML ~~LOC~~ SOLN
40.0000 mg | SUBCUTANEOUS | Status: DC
  Administered 2018-08-08 (×2): 40 mg via SUBCUTANEOUS
  Filled 2018-08-07 (×2): qty 0.4

## 2018-08-07 MED ORDER — FOLIC ACID 1 MG PO TABS
1.0000 mg | ORAL_TABLET | Freq: Every day | ORAL | Status: DC
  Administered 2018-08-08 – 2018-08-11 (×5): 1 mg via ORAL
  Filled 2018-08-07 (×5): qty 1

## 2018-08-07 MED ORDER — ONDANSETRON HCL 4 MG PO TABS: 4.0000 mg | ORAL_TABLET | Freq: Four times a day (QID) | ORAL | Status: DC | PRN

## 2018-08-07 MED ORDER — VENLAFAXINE HCL ER 75 MG PO CP24
150.0000 mg | ORAL_CAPSULE | Freq: Every day | ORAL | Status: DC
  Administered 2018-08-08 – 2018-08-11 (×4): 150 mg via ORAL
  Filled 2018-08-07 (×4): qty 2

## 2018-08-07 MED ORDER — ONDANSETRON HCL 4 MG/2ML IJ SOLN: 4.0000 mg | Freq: Four times a day (QID) | INTRAMUSCULAR | Status: DC | PRN

## 2018-08-07 MED ORDER — ACETAMINOPHEN 325 MG PO TABS: 650.0000 mg | ORAL_TABLET | Freq: Four times a day (QID) | ORAL | Status: DC | PRN

## 2018-08-07 MED ORDER — METRONIDAZOLE IN NACL 5-0.79 MG/ML-% IV SOLN
500.0000 mg | Freq: Three times a day (TID) | INTRAVENOUS | Status: DC
  Administered 2018-08-08 – 2018-08-11 (×9): 500 mg via INTRAVENOUS
  Filled 2018-08-07 (×13): qty 100

## 2018-08-07 MED ORDER — VANCOMYCIN HCL IN DEXTROSE 1-5 GM/200ML-% IV SOLN: 1000.0000 mg | Freq: Once | INTRAVENOUS | Status: DC

## 2018-08-07 MED ORDER — LORATADINE 10 MG PO TABS
10.0000 mg | ORAL_TABLET | Freq: Every day | ORAL | Status: DC
  Administered 2018-08-08 – 2018-08-11 (×4): 10 mg via ORAL
  Filled 2018-08-07 (×5): qty 1

## 2018-08-07 MED ORDER — INSULIN ASPART 100 UNIT/ML ~~LOC~~ SOLN
0.0000 [IU] | Freq: Three times a day (TID) | SUBCUTANEOUS | Status: DC
  Administered 2018-08-08: 5 [IU] via SUBCUTANEOUS
  Administered 2018-08-08: 8 [IU] via SUBCUTANEOUS
  Administered 2018-08-08: 5 [IU] via SUBCUTANEOUS
  Administered 2018-08-09: 8 [IU] via SUBCUTANEOUS
  Administered 2018-08-09 – 2018-08-10 (×3): 2 [IU] via SUBCUTANEOUS
  Administered 2018-08-10: 3 [IU] via SUBCUTANEOUS
  Administered 2018-08-10: 2 [IU] via SUBCUTANEOUS
  Administered 2018-08-11: 3 [IU] via SUBCUTANEOUS
  Filled 2018-08-07 (×9): qty 1

## 2018-08-07 MED ORDER — INSULIN ASPART 100 UNIT/ML ~~LOC~~ SOLN
0.0000 [IU] | Freq: Every day | SUBCUTANEOUS | Status: DC
  Administered 2018-08-08: 3 [IU] via SUBCUTANEOUS
  Administered 2018-08-08: 4 [IU] via SUBCUTANEOUS
  Filled 2018-08-07 (×2): qty 1

## 2018-08-07 MED ORDER — POLYETHYLENE GLYCOL 3350 17 G PO PACK: 17.0000 g | PACK | Freq: Every day | ORAL | Status: DC | PRN

## 2018-08-07 MED ORDER — VITAMIN D 25 MCG (1000 UNIT) PO TABS
2000.0000 [IU] | ORAL_TABLET | Freq: Every day | ORAL | Status: DC
  Administered 2018-08-08 – 2018-08-11 (×4): 2000 [IU] via ORAL
  Filled 2018-08-07 (×4): qty 2

## 2018-08-07 MED ORDER — LOSARTAN POTASSIUM 25 MG PO TABS
25.0000 mg | ORAL_TABLET | Freq: Every day | ORAL | Status: DC
  Administered 2018-08-08 – 2018-08-11 (×4): 25 mg via ORAL
  Filled 2018-08-07 (×4): qty 1

## 2018-08-07 NOTE — Progress Notes (Signed)
CODE SEPSIS - PHARMACY COMMUNICATION  **Broad Spectrum Antibiotics should be administered within 1 hour of Sepsis diagnosis**  Time Code Sepsis Called/Page Received: n/a  Antibiotics Ordered: vanc/cefepime/flagyl  Time of 1st antibiotic administration: 2213  Additional action taken by pharmacy: Pharmacist called multiple times to get abx administered.  If necessary, Name of Provider/Nurse Contacted:     Tobie Lords ,PharmD Clinical Pharmacist  08/07/2018  10:59 PM

## 2018-08-07 NOTE — Progress Notes (Signed)
Advanced care plan. Purpose of the Encounter: CODE STATUS Parties in Attendance: Patient Patient's Decision Capacity:Good Subjective/Patient's story: Theresa Booker  is a 73 y.o. female with a known history of asthma, CKD, diabetes mellitus, fibromyalgia, hypertension, hyperlipidemia.  She presented to the emergency room via EMS services from home reportedly sent by home health care providers with elevated glucose at 488 with reported altered mental status at that time.  Notably patient is completing a 12-day prednisone pack prescribed her on 08/02/2022 plantar fasciitis.  Notably patient was recently admitted with discharge on 07/29/2018 after being treated for exacerbation of asthma.  Initially she has been admitted with altered mental status at that time as well.  According to documentation, patient has taken Aricept in the past in 2017 however the family had denied any history of dementia at that time.  At the time that I am seeing the patient, she is awake, alert, and oriented x4.  She was hypertensive on arrival with blood pressure 166/111.  Glucose was 311.  Hemoglobin A1c is 8.1.  Lactic acid is 2.0.  She has noted no dysuria.  However, she has noted dark urine with a foul odor.  Urinalysis demonstrates rare bacteria and 0-5 white blood cells as well as evidence of mucus.  Chest x-ray demonstrated mild bibasilar atelectasis with no edema or consolidation.  CT head was completed as patient was felt to be slightly lethargic with altered mental status on her arrival.  CT head was negative.  Notably MRI brain on 07/29/2018 demonstrated no acute intracranial abnormality.  Patient denies increased shortness of breath.  She denies cough.  She denies chest pain.  She has noted no fevers, chills, nausea, vomiting, diarrhea.  She denies abdominal pain.  She denies hematemesis, hematochezia, or melena.  There are no visible wounds present on my assessment.  She denies falls.  There is leukocytosis present on her  arrival with WBC 15.8.  BUN is 36 with creatinine 1.62.  Blood and urine cultures are pending.  Patient received 1 L normal saline bolus on arrival. We have admitted her to the hospitalist service for sepsis with urinary tract infection.  We will continue to monitor closely and treat her expectantly Objective/Medical story Patient needs work-up for sepsis and needs IV fluids and antibiotics.  We will follow-up cultures and lactic acid levels.  Initially patient will be started on broad-spectrum antibiotics and later on tapered. Goals of care determination:  Advance care directives goals of care and treatment plan discussed.  For now patient wants CPR, intubation ventilator if the need arises.  Full resuscitation. CODE STATUS: Full code Time spent discussing advanced care planning: 16 minutes

## 2018-08-07 NOTE — ED Notes (Signed)
PA made aware of lactic acid of 2.3

## 2018-08-07 NOTE — ED Notes (Signed)
ED TO INPATIENT HANDOFF REPORT  ED Nurse Name and Phone #: Antonieta Pert, RN [phone]  S Name/Age/Gender Theresa Booker 73 y.o. female Room/Bed: ED14A/ED14A  Code Status   Code Status: Full Code  Home/SNF/Other Home Patient oriented to: self, place, time and situation Is this baseline? No   Triage Complete: Triage complete  Chief Complaint hyperglycemia  Triage Note PT to ED via EMS from home. Home health nurse visited pt for first time today and found AMS and high blood sugar. PT is known diabetic. PT is alert at this time.    Allergies Allergies  Allergen Reactions  . Ace Inhibitors Other (See Comments) and Cough    Constant persistent cough  . Pollen Extracts [Pollen Extract] Other (See Comments)    Sneezing/watery eyes/itchy eye  . Vicodin [Hydrocodone-Acetaminophen] Nausea And Vomiting    Level of Care/Admitting Diagnosis ED Disposition    ED Disposition Condition [facility]: [place] [[id number]]  Level of Care: Med-Surg [16]  Covid Evaluation: Confirmed COVID Negative  Diagnosis: Sepsis [facility]) [[id number]]  Admitting Physician: Saundra Shelling [[id number]]  Attending Physician: Saundra Shelling [[id number]]  Estimated length of stay: 3 - 4 days  Certification:: I certify this patient will need inpatient services for at least 2 midnights  PT Class (Do Not Modify): Inpatient [101]  PT Acc Code (Do Not Modify): Private [1]       B Medical/Surgery History Past Medical History:  Diagnosis Date  . Asthma   . Atypical chest pain    neg ETT at [facility]  . CKD (chronic kidney disease), stage III ([place])   . Depression   . Diabetes mellitus without complication ([place])   . Fibromyalgia   . HLD (hyperlipidemia)   . Hypertension   . Vitamin D deficiency    Past Surgical History:  Procedure Laterality Date  . ABDOMINAL HYSTERECTOMY    . COLONOSCOPY WITH PROPOFOL N/A 05/22/2016   Procedure: COLONOSCOPY WITH PROPOFOL;  Surgeon: Lollie Sails, MD;  Location: [facility] ENDOSCOPY;  Service: Endoscopy;  Laterality: N/A;  . ESOPHAGOGASTRODUODENOSCOPY (EGD) WITH PROPOFOL N/A 05/22/2016   Procedure: ESOPHAGOGASTRODUODENOSCOPY (EGD) WITH PROPOFOL;  Surgeon: Lollie Sails, MD;  Location: [facility] ENDOSCOPY;  Service: Endoscopy;  Laterality: N/A;  . HAND SURGERY Right 2014   Dr Rudene Christians  . rotator cuff surgery Left 03/2013   DR Rudene Christians  . SHOULDER ARTHROSCOPY WITH OPEN ROTATOR CUFF REPAIR Right 04/25/2017   Procedure: SHOULDER ARTHROSCOPY WITH OPEN ROTATOR CUFF REPAIR;  Surgeon: Corky Mull, MD;  Location: [facility] ORS;  Service: Orthopedics;  Laterality: Right;  . SHOULDER ARTHROSCOPY WITH OPEN ROTATOR CUFF REPAIR Right 03/11/2018   Procedure: SHOULDER ARTHROSCOPY WITH Recurrent  OPEN ROTATOR CUFF REPAIR;  Surgeon: Corky Mull, MD;  Location: [facility] ORS;  Service: Orthopedics;  Laterality: Right;  debridment and decompression     A IV Location/Drains/Wounds Patient Lines/Drains/Airways Status   Active Line/Drains/Airways    Name:   Placement date:   Placement time:   Site:   Days:   Peripheral IV 08/07/18 Left Antecubital   08/07/18    1135    Antecubital   less than 1   Peripheral IV 08/07/18 Right Hand   08/07/18    2106    Hand   less than 1   External Urinary Catheter   08/07/18    2018    -   less than 1          Intake/Output Last 24 hours  Intake/Output Summary (Last 24 hours) at 08/07/2018 2155 Last data filed at 08/07/2018 1400 Gross per 24 hour  Intake 1000 ml  Output -  Net 1000 ml    Labs/Imaging Results for orders placed or performed during the hospital encounter of 08/07/18 (from the past 48 hour(s))  Urinalysis, Complete w Microscopic     Status: Abnormal   Collection Time: 08/07/18 11:50 AM  Result Value Ref Range   Color, Urine STRAW (A) YELLOW   APPearance CLEAR (A) CLEAR   Specific Gravity, Urine 1.021 1.005 - 1.030   pH 6.0 5.0 - 8.0   Glucose, UA >=500 (A) NEGATIVE mg/dL   Hgb urine dipstick NEGATIVE NEGATIVE    Bilirubin Urine NEGATIVE NEGATIVE   Ketones, ur NEGATIVE NEGATIVE mg/dL   Protein, ur 30 (A) NEGATIVE mg/dL   Nitrite NEGATIVE NEGATIVE   Leukocytes,Ua NEGATIVE NEGATIVE   RBC / HPF 0-5 0 - 5 RBC/hpf   WBC, UA 0-5 0 - 5 WBC/hpf   Bacteria, UA RARE (A) NONE SEEN   Squamous Epithelial / LPF 0-5 0 - 5    Comment: Performed at [facility], [address] [place], [place] [address]  Glucose, capillary     Status: Abnormal   Collection Time: 08/07/18 11:51 AM  Result Value Ref Range   Glucose-Capillary 488 (H) 70 - 99 mg/dL   Comment 1 Document in Chart   Comprehensive metabolic panel     Status: Abnormal   Collection Time: 08/07/18 12:01 PM  Result Value Ref Range   Sodium 132 (L) 135 - 145 mmol/L   Potassium 4.5 3.5 - 5.1 mmol/L   Chloride 96 (L) 98 - 111 mmol/L   CO2 19 (L) 22 - 32 mmol/L   Glucose, Bld 510 (HH) 70 - 99 mg/dL    Comment: CRITICAL RESULT CALLED TO, READ BACK BY AND VERIFIED WITH PAIGE JOHNSON AT 1249 08/07/2018 DAS    BUN 40 (H) 8 - 23 mg/dL   Creatinine, Ser 1.66 (H) 0.44 - 1.00 mg/dL   Calcium 8.8 (L) 8.9 - 10.3 mg/dL   Total Protein 7.3 6.5 - 8.1 g/dL   Albumin 3.4 (L) 3.5 - 5.0 g/dL   AST 21 15 - 41 U/L   ALT 20 0 - 44 U/L   Alkaline Phosphatase 135 (H) 38 - 126 U/L   Total Bilirubin 0.3 0.3 - 1.2 mg/dL   GFR calc non Af Amer 30 (L) >60 mL/min   GFR calc Af Amer 35 (L) >60 mL/min   Anion gap 17 (H) 5 - 15    Comment: Performed at [facility], [address] [place], [place] [address]  Troponin I - Once     Status: None   Collection Time: 08/07/18 12:01 PM  Result Value Ref Range   Troponin I <0.03 <0.03 ng/mL    Comment: Performed at [facility], [address] [place], [place] [address]  Lactic acid, plasma     Status: Abnormal   Collection Time: 08/07/18 12:01 PM  Result Value Ref Range   Lactic Acid, Venous 3.3 (HH) 0.5 - 1.9 mmol/L    Comment: CRITICAL RESULT CALLED TO, READ BACK BY AND VERIFIED WITH PAIGE  JOHNSON AT 1340 ON 08/07/18 KLM Performed at [facility], [address] [place],  [address]   CBC with Differential     Status: Abnormal   Collection Time: 08/07/18 12:01 PM  Result Value Ref Range   WBC 15.8 (H) 4.0 - 10.5 K/uL  RBC 4.74 3.87 - 5.11 MIL/uL   Hemoglobin 13.8 12.0 - 15.0 g/dL   HCT 43.6 36.0 - 46.0 %   MCV 92.0 80.0 - 100.0 fL   MCH 29.1 26.0 - 34.0 pg   MCHC 31.7 30.0 - 36.0 g/dL   RDW 13.7 11.5 - 15.5 %   Platelets 350 150 - 400 K/uL   nRBC 0.0 0.0 - 0.2 %   Neutrophils Relative % 82 %   Neutro Abs 12.8 (H) 1.7 - 7.7 K/uL   Lymphocytes Relative 11 %   Lymphs Abs 1.8 0.7 - 4.0 K/uL   Monocytes Relative 6 %   Monocytes Absolute 1.0 0.1 - 1.0 K/uL   Eosinophils Relative 0 %   Eosinophils Absolute 0.0 0.0 - 0.5 K/uL   Basophils Relative 0 %   Basophils Absolute 0.1 0.0 - 0.1 K/uL   Immature Granulocytes 1 %   Abs Immature Granulocytes 0.16 (H) 0.00 - 0.07 K/uL    Comment: Performed at [facility], [address] [place], [place] [address]  Glucose, capillary     Status: Abnormal   Collection Time: 08/07/18  2:11 PM  Result Value Ref Range   Glucose-Capillary 262 (H) 70 - 99 mg/dL  Basic metabolic panel     Status: Abnormal   Collection Time: 08/07/18  2:36 PM  Result Value Ref Range   Sodium 134 (L) 135 - 145 mmol/L   Potassium 4.5 3.5 - 5.1 mmol/L   Chloride 98 98 - 111 mmol/L   CO2 26 22 - 32 mmol/L   Glucose, Bld 311 (H) 70 - 99 mg/dL   BUN 36 (H) 8 - 23 mg/dL   Creatinine, Ser 1.62 (H) 0.44 - 1.00 mg/dL   Calcium 8.9 8.9 - 10.3 mg/dL   GFR calc non Af Amer 31 (L) >60 mL/min   GFR calc Af Amer 36 (L) >60 mL/min   Anion gap 10 5 - 15    Comment: Performed at [facility], [address] [place], [place] [address]  Lactic acid, plasma     Status: Abnormal   Collection Time: 08/07/18  3:49 PM  Result Value Ref Range   Lactic Acid, Venous 2.3 (HH) 0.5 - 1.9 mmol/L    Comment: CRITICAL RESULT CALLED TO, READ  BACK BY AND VERIFIED WITH PAIGE JOHNSON 08/07/18 @ [place] Performed at [facility], [address] [place], [place] [address]   Lactic acid, plasma     Status: Abnormal   Collection Time: 08/07/18  6:48 PM  Result Value Ref Range   Lactic Acid, Venous 2.0 (HH) 0.5 - 1.9 mmol/L    Comment: CRITICAL RESULT CALLED TO, READ BACK BY AND VERIFIED WITH Terri P[facility] 08/07/18 @ 1926  MLK Performed at [facility], [address], [place],  [address]   SARS Coronavirus 2 (CEPHEID - Performed in [place] hospital lab), Hosp Order     Status: None   Collection Time: 08/07/18  6:48 PM  Result Value Ref Range   SARS Coronavirus 2 NEGATIVE NEGATIVE    Comment: (NOTE) If result is NEGATIVE SARS-CoV-2 target nucleic acids are NOT DETECTED. The SARS-CoV-2 RNA is generally detectable in upper and lower  respiratory specimens during the acute phase of infection. The lowest  concentration of SARS-CoV-2 viral copies this assay can detect is 250  copies / mL. A negative result does not preclude SARS-CoV-2 infection  and should not be used as the sole basis for treatment or other  patient management decisions.  A negative result may occur with  improper specimen collection / handling, submission of specimen other  than nasopharyngeal swab, presence of viral mutation(s) within the  areas targeted by this assay, and inadequate number of viral copies  (<250 copies / mL). A negative result must be combined with clinical  observations, patient history, and epidemiological information. If result is POSITIVE SARS-CoV-2 target nucleic acids are DETECTED. The SARS-CoV-2 RNA is generally detectable in upper and lower  respiratory specimens dur ing the acute phase of infection.  Positive  results are indicative of active infection with SARS-CoV-2.  Clinical  correlation with patient history and other diagnostic information is  necessary to determine patient infection status.   Positive results do  not rule out bacterial infection or co-infection with other viruses. If result is PRESUMPTIVE POSTIVE SARS-CoV-2 nucleic acids MAY BE PRESENT.   A presumptive positive result was obtained on the submitted specimen  and confirmed on repeat testing.  While 2019 novel coronavirus  (SARS-CoV-2) nucleic acids may be present in the submitted sample  additional confirmatory testing may be necessary for epidemiological  and / or clinical management purposes  to differentiate between  SARS-CoV-2 and other Sarbecovirus currently known to infect humans.  If clinically indicated additional testing with an alternate test  methodology [phone]) is advised. The SARS-CoV-2 RNA is generally  detectable in upper and lower respiratory sp ecimens during the acute  phase of infection. The expected result is Negative. Fact Sheet for Patients:  StrictlyIdeas.no Fact Sheet for Healthcare Providers: BankingDealers.co.za This test is not yet approved or cleared by the [place] FDA and has been authorized for detection and/or diagnosis of SARS-CoV-2 by FDA under an Emergency Use Authorization (EUA).  This EUA will remain in effect (meaning this test can be used) for the duration of the COVID-19 declaration under Section 564(b)(1) of the Act, 21 U.S.C. section 360bbb-3(b)(1), unless the authorization is terminated or revoked sooner. Performed at [facility], [address] [place], [place] [address]   Glucose, capillary     Status: Abnormal   Collection Time: 08/07/18  7:41 PM  Result Value Ref Range   Glucose-Capillary 277 (H) 70 - 99 mg/dL  Lactic acid, plasma     Status: Abnormal   Collection Time: 08/07/18  8:57 PM  Result Value Ref Range   Lactic Acid, Venous 2.1 (HH) 0.5 - 1.9 mmol/L    Comment: CRITICAL RESULT CALLED TO, READ BACK BY AND VERIFIED WITH RACHAEL HAIDEN 08/07/18 @ 2136  [facility] Performed at [facility], [place], [place] [address]    Ct Head Wo Contrast  Result Date: 08/07/2018 CLINICAL DATA:  73 year old female with altered mental status, headache, ataxia, hyperglycemia. EXAM: CT HEAD WITHOUT CONTRAST TECHNIQUE: Contiguous axial images were obtained from the base of the skull through the vertex without intravenous contrast. COMPARISON:  Brain MRI and head CT 07/29/2018 and earlier. FINDINGS: Brain: Stable gray-white matter differentiation throughout the brain. No midline shift, ventriculomegaly, mass effect, evidence of mass lesion, intracranial hemorrhage or evidence of cortically based acute infarction. Mild white matter hypodensity. No cortical encephalomalacia identified. Vascular: Calcified atherosclerosis at the skull base. No suspicious intracranial vascular hyperdensity. Skull: Negative. Sinuses/Orbits: Visualized paranasal sinuses and mastoids are stable and well pneumatized. Other: Visualized orbits and scalp soft tissues are within normal limits. IMPRESSION: Stable mild white matter changes. No acute intracranial abnormality. Electronically Signed   By: Genevie Ann M.D.   On: 08/07/2018 18:09  Dg Chest Portable 1 View  Result Date: 08/07/2018 CLINICAL DATA:  Altered mental status and hyperglycemia EXAM: PORTABLE CHEST 1 VIEW COMPARISON:  Jul 26, 2018 FINDINGS: There is bibasilar atelectasis. There is no edema or consolidation. Heart is upper normal in size with pulmonary vascularity normal. No adenopathy. There is degenerative change in the thoracic spine. IMPRESSION: Mild bibasilar atelectasis. No edema or consolidation. Stable cardiac silhouette. Electronically Signed   By: Lowella Grip III M.D.   On: 08/07/2018 12:46    Pending Labs Unresulted Labs (From admission, onward)    Start     Ordered   08/14/18 0500  Creatinine, serum  (enoxaparin (LOVENOX)    CrCl >/= 30 ml/min)  Weekly,   STAT    Comments:  while on enoxaparin therapy    08/07/18 2121   08/08/18  0500  Protime-INR  Tomorrow morning,   STAT     08/07/18 2121   08/08/18 0500  Cortisol-am, blood  Tomorrow morning,   STAT     08/07/18 2121   08/08/18 0500  Procalcitonin  Tomorrow morning,   STAT     08/07/18 2121   08/08/18 7989  Basic metabolic panel  Tomorrow morning,   STAT     08/07/18 2121   08/08/18 0500  CBC  Tomorrow morning,   STAT     08/07/18 2121   08/07/18 2118  TSH  Once,   STAT     08/07/18 2121   08/07/18 2030  Blood culture (routine x 2)  BLOOD CULTURE X 2,   STAT     08/07/18 2029          Vitals/Pain Today's Vitals   08/07/18 1945 08/07/18 2000 08/07/18 2015 08/07/18 2030  BP:  (!) 133/101  (!) 149/99  Pulse: 94 95 91 85  Resp: 20 (!) 21 (!) 22 (!) 22  Temp:      SpO2: 96% 92% 93% 99%  Weight:      Height:      PainSc:        Isolation Precautions No active isolations  Medications Medications  enoxaparin (LOVENOX) injection 40 mg (has no administration in time range)  sodium chloride flush (NS) 0.9 % injection 3 mL (has no administration in time range)  0.9 %  sodium chloride infusion (has no administration in time range)  metroNIDAZOLE (FLAGYL) IVPB 500 mg (has no administration in time range)  folic acid (FOLVITE) tablet 1 mg (has no administration in time range)  thiamine (VITAMIN B-1) tablet 100 mg (has no administration in time range)  acetaminophen (TYLENOL) tablet 650 mg (has no administration in time range)    Or  acetaminophen (TYLENOL) suppository 650 mg (has no administration in time range)  polyethylene glycol (MIRALAX / GLYCOLAX) packet 17 g (has no administration in time range)  ondansetron (ZOFRAN) tablet 4 mg (has no administration in time range)    Or  ondansetron (ZOFRAN) injection 4 mg (has no administration in time range)  insulin aspart (novoLOG) injection 0-15 Units (has no administration in time range)  insulin aspart (novoLOG) injection 0-5 Units (has no administration in time range)  vancomycin (VANCOCIN) 1,500 mg in  sodium chloride 0.9 % 500 mL IVPB (has no administration in time range)  vancomycin (VANCOCIN) 1,250 mg in sodium chloride 0.9 % 250 mL IVPB (has no administration in time range)  ceFEPIme (MAXIPIME) 1 g in sodium chloride 0.9 % 100 mL IVPB (has no administration in time range)  sodium chloride 0.9 % bolus  1,000 mL (0 mLs Intravenous Stopped 08/07/18 1400)  insulin aspart (novoLOG) injection 10 Units (10 Units Intravenous Given 08/07/18 1255)  sodium chloride 0.9 % bolus 1,000 mL (0 mLs Intravenous Stopped 08/07/18 1853)    Mobility walks with device High fall risk   Focused Assessments Neuro Assessment Handoff:  Swallow screen pass? Not indicated     Last date known well: 06/18/18   Neuro Assessment: Exceptions to WDL Neuro Checks:      Last Documented NIHSS Modified Score:   Has TPA been given? No If patient is a Neuro Trauma and patient is going to OR before floor call report to [place] nurse: [phone] or [phone]     R Recommendations: See Admitting Provider Note  Report given to:   Additional Notes: Pt is A&O x4 but is slow to respond to some orientation questions. Pt's family states this is not her baseline.

## 2018-08-07 NOTE — H&P (Signed)
[facility] [place] NAME: Theresa Booker    MR#:  [id number]  DATE OF BIRTH:  May 31, 1945  DATE OF ADMISSION:  08/07/2018  PRIMARY CARE PHYSICIAN: [facility] [place]   REQUESTING/REFERRING PHYSICIAN: Ashok Cordia, MD  CHIEF COMPLAINT:   Chief Complaint  Patient presents with  . Hyperglycemia  . Altered Mental Status    HISTORY OF PRESENT ILLNESS:  Theresa Booker  is a 73 y.o. female with a known history of asthma, CKD, diabetes mellitus, fibromyalgia, hypertension, hyperlipidemia.  She presented to the emergency room via EMS services from home reportedly sent by home health care providers with elevated glucose at 488 with reported altered mental status at that time.  Notably patient is completing a 12-day prednisone pack prescribed her on 08/02/2022 plantar fasciitis.  Notably patient was recently admitted with discharge on 07/29/2018 after being treated for exacerbation of asthma.  Initially she has been admitted with altered mental status at that time as well.  According to documentation, patient has taken Aricept in the past in 2017 however the family had denied any history of dementia at that time.  At the time that I am seeing the patient, she is awake, alert, and oriented x4.  She was hypertensive on arrival with blood pressure 166/111.  Glucose was 311.  Hemoglobin A1c is 8.1.  Lactic acid is 2.0.  She has noted no dysuria.  However, she has noted dark urine with a foul odor.  Urinalysis demonstrates rare bacteria and 0-5 white blood cells as well as evidence of mucus.  Chest x-ray demonstrated mild bibasilar atelectasis with no edema or consolidation.  CT head was completed as patient was felt to be slightly lethargic with altered mental status on her arrival.  CT head was negative.  Notably MRI brain on 07/29/2018 demonstrated no acute intracranial abnormality.  Patient denies increased shortness of breath.  She denies cough.  She denies  chest pain.  She has noted no fevers, chills, nausea, vomiting, diarrhea.  She denies abdominal pain.  She denies hematemesis, hematochezia, or melena.  There are no visible wounds present on my assessment.  She denies falls.  There is leukocytosis present on her arrival with WBC 15.8.  BUN is 36 with creatinine 1.62.  Blood and urine cultures are pending.  Patient received 1 L normal saline bolus on arrival.  We have admitted her to the hospitalist service for sepsis with urinary tract infection.  We will continue to monitor closely and treat her expectantly  PAST MEDICAL HISTORY:   Past Medical History:  Diagnosis Date  . Asthma   . Atypical chest pain    neg ETT at [facility]  . CKD (chronic kidney disease), stage III ([place])   . Depression   . Diabetes mellitus without complication ([place])   . Fibromyalgia   . HLD (hyperlipidemia)   . Hypertension   . Vitamin D deficiency     PAST SURGICAL HISTORY:   Past Surgical History:  Procedure Laterality Date  . ABDOMINAL HYSTERECTOMY    . COLONOSCOPY WITH PROPOFOL N/A 05/22/2016   Procedure: COLONOSCOPY WITH PROPOFOL;  Surgeon: Lollie Sails, MD;  Location: [facility] ENDOSCOPY;  Service: Endoscopy;  Laterality: N/A;  . ESOPHAGOGASTRODUODENOSCOPY (EGD) WITH PROPOFOL N/A 05/22/2016   Procedure: ESOPHAGOGASTRODUODENOSCOPY (EGD) WITH PROPOFOL;  Surgeon: Lollie Sails, MD;  Location: [facility] ENDOSCOPY;  Service: Endoscopy;  Laterality: N/A;  . HAND SURGERY Right 2014   Dr Rudene Christians  . rotator cuff surgery  Left 03/2013   DR Rudene Christians  . SHOULDER ARTHROSCOPY WITH OPEN ROTATOR CUFF REPAIR Right 04/25/2017   Procedure: SHOULDER ARTHROSCOPY WITH OPEN ROTATOR CUFF REPAIR;  Surgeon: Corky Mull, MD;  Location: [facility] ORS;  Service: Orthopedics;  Laterality: Right;  . SHOULDER ARTHROSCOPY WITH OPEN ROTATOR CUFF REPAIR Right 03/11/2018   Procedure: SHOULDER ARTHROSCOPY WITH Recurrent  OPEN ROTATOR CUFF REPAIR;  Surgeon: Corky Mull, MD;  Location: [facility] ORS;  Service:  Orthopedics;  Laterality: Right;  debridment and decompression    SOCIAL HISTORY:   Social History   Tobacco Use  . Smoking status: Never Smoker  . Smokeless tobacco: Never Used  Substance Use Topics  . Alcohol use: No    Alcohol/week: 0.0 standard drinks    FAMILY HISTORY:   Family History  Problem Relation Age of Onset  . Diabetes Mother   . Breast cancer Mother 53  . Cancer Sister   . Cancer Father   . Liver disease Brother   . Breast cancer Maternal Aunt   . Breast cancer Cousin        maternal    DRUG ALLERGIES:   Allergies  Allergen Reactions  . Ace Inhibitors Other (See Comments) and Cough    Constant persistent cough  . Pollen Extracts [Pollen Extract] Other (See Comments)    Sneezing/watery eyes/itchy eye  . Vicodin [Hydrocodone-Acetaminophen] Nausea And Vomiting    REVIEW OF SYSTEMS:   Review of Systems  Constitutional: Positive for malaise/fatigue. Negative for chills and fever.  HENT: Negative for congestion, sinus pain and sore throat.   Eyes: Negative for blurred vision, double vision and pain.  Respiratory: Negative for cough, hemoptysis, sputum production, shortness of breath and wheezing.   Cardiovascular: Negative for chest pain, palpitations, orthopnea, claudication and leg swelling.  Gastrointestinal: Negative for abdominal pain, blood in stool, constipation, diarrhea, heartburn, melena, nausea and vomiting.  Genitourinary: Negative for dysuria, flank pain, frequency, hematuria and urgency.  Musculoskeletal: Negative for falls, joint pain and myalgias.  Skin: Negative for itching and rash.  Neurological: Negative for dizziness, loss of consciousness, weakness and headaches.  Psychiatric/Behavioral: Negative.       MEDICATIONS AT HOME:   Prior to Admission medications   Medication Sig Start Date End Date Taking? Authorizing Provider  acetaminophen (TYLENOL) 500 MG tablet Take 500-1,000 mg by mouth every 6 (six) hours as needed for mild  pain or headache.    Yes [provider]  albuterol (PROAIR HFA) 108 (90 BASE) MCG/ACT inhaler Inhale 2 puffs into the lungs every 6 (six) hours as needed for wheezing or shortness of breath.    Yes [provider]  aspirin EC 81 MG tablet Take 1 tablet (81 mg total) by mouth daily. 07/30/18 07/30/19 Yes Salary, Avel Peace, MD  benzonatate (TESSALON PERLES) 100 MG capsule Take 1 capsule (100 mg total) by mouth every 6 (six) hours as needed for cough. 07/22/18 07/22/19 Yes Harvest Dark, MD  Cholecalciferol (VITAMIN D3) 50 MCG (2000 UT) TABS Take 2,000 Units by mouth daily.   Yes [provider]  diclofenac sodium (VOLTAREN) 1 % GEL Apply 2 g topically 2 (two) times daily as needed. Patient taking differently: Apply 2 g topically 2 (two) times daily as needed (for pain).  09/30/17  Yes Merlyn Lot, MD  hydrochlorothiazide (HYDRODIURIL) 25 MG tablet Take 25 mg by mouth daily.    Yes [provider]  Insulin Glargine (LANTUS SOLOSTAR) 100 UNIT/ML Solostar Pen Inject 43 Units into the skin daily.  Yes [provider]  isosorbide mononitrate (IMDUR) 30 MG 24 hr tablet Take 30 mg by mouth daily.   Yes [provider]  loratadine (CLARITIN) 10 MG tablet Take 10 mg by mouth daily.   Yes [provider]  losartan (COZAAR) 25 MG tablet Take 25 mg by mouth daily.   Yes [provider]  Multiple Vitamin (MULTIVITAMIN WITH MINERALS) TABS tablet Take 1 tablet by mouth daily. 07/31/18  Yes Salary, Avel Peace, MD  nortriptyline (PAMELOR) 10 MG capsule Take 30 mg by mouth at bedtime.    Yes [provider]  omeprazole (PRILOSEC) 20 MG capsule Take 20 mg by mouth daily.    Yes [provider]  ondansetron (ZOFRAN ODT) 4 MG disintegrating tablet Take 1 tablet (4 mg total) by mouth every 8 (eight) hours as needed for nausea or vomiting. 03/11/18  Yes Poggi, Marshall Cork, MD  oxyCODONE (ROXICODONE) 5 MG immediate release tablet Take 1-2  tablets (5-10 mg total) by mouth every 4 (four) hours as needed. 03/11/18  Yes Poggi, Marshall Cork, MD  sitaGLIPtin (JANUVIA) 100 MG tablet Take 100 mg by mouth daily.   Yes [provider]  SUMAtriptan (IMITREX) 100 MG tablet Take 100 mg by mouth every 2 (two) hours as needed for migraine. May repeat in 2 hours if headache persists or recurs.   Yes [provider]  venlafaxine XR (EFFEXOR-XR) 150 MG 24 hr capsule Take 150 mg by mouth daily with breakfast.    Yes [provider]      VITAL SIGNS:  Blood pressure (!) 149/99, pulse 85, temperature 98.2 F (36.8 C), resp. rate (!) 22, height 5\' 1"  (1.549 m), weight 72.5 kg, SpO2 99 %.  PHYSICAL EXAMINATION:  Physical Exam Constitutional:      Appearance: Normal appearance.  HENT:     Head: Normocephalic and atraumatic.     Right Ear: External ear normal.     Left Ear: External ear normal.     Nose: Nose normal. No congestion.     Mouth/Throat:     Mouth: Mucous membranes are moist.     Pharynx: Oropharynx is clear.  Eyes:     General: No scleral icterus.    Extraocular Movements: Extraocular movements intact.     Conjunctiva/sclera: Conjunctivae normal.     Pupils: Pupils are equal, round, and reactive to light.  Neck:     Musculoskeletal: Normal range of motion and neck supple. No muscular tenderness.  Cardiovascular:     Rate and Rhythm: Normal rate and regular rhythm.     Pulses: Normal pulses.     Heart sounds: Normal heart sounds. No murmur. No friction rub. No gallop.   Pulmonary:     Effort: Pulmonary effort is normal. No respiratory distress.     Breath sounds: Normal breath sounds. No wheezing, rhonchi or rales.  Abdominal:     General: Bowel sounds are normal. There is no distension.     Palpations: Abdomen is soft. There is no mass.     Tenderness: There is no abdominal tenderness.  Musculoskeletal: Normal range of motion.        General: No swelling or tenderness.     Right lower leg: No edema.      Left lower leg: No edema.  Skin:    General: Skin is warm and dry.     Capillary Refill: Capillary refill takes less than 2 seconds.     Findings: No rash.  Neurological:  General: No focal deficit present.     Mental Status: She is alert and oriented to person, place, and time.     Cranial Nerves: No cranial nerve deficit.     Motor: No weakness.  Psychiatric:        Mood and Affect: Mood normal.        Behavior: Behavior normal.       LABORATORY PANEL:   CBC Recent Labs  Lab 08/07/18 1201  WBC 15.8*  HGB 13.8  HCT 43.6  PLT 350   ------------------------------------------------------------------------------------------------------------------  Chemistries  Recent Labs  Lab 08/07/18 1201 08/07/18 1436  NA 132* 134*  K 4.5 4.5  CL 96* 98  CO2 19* 26  GLUCOSE 510* 311*  BUN 40* 36*  CREATININE 1.66* 1.62*  CALCIUM 8.8* 8.9  AST 21  --   ALT 20  --   ALKPHOS 135*  --   BILITOT 0.3  --    ------------------------------------------------------------------------------------------------------------------  Cardiac Enzymes Recent Labs  Lab 08/07/18 1201  TROPONINI <0.03   ------------------------------------------------------------------------------------------------------------------  RADIOLOGY:  Ct Head Wo Contrast  Result Date: 08/07/2018 CLINICAL DATA:  73 year old female with altered mental status, headache, ataxia, hyperglycemia. EXAM: CT HEAD WITHOUT CONTRAST TECHNIQUE: Contiguous axial images were obtained from the base of the skull through the vertex without intravenous contrast. COMPARISON:  Brain MRI and head CT 07/29/2018 and earlier. FINDINGS: Brain: Stable gray-white matter differentiation throughout the brain. No midline shift, ventriculomegaly, mass effect, evidence of mass lesion, intracranial hemorrhage or evidence of cortically based acute infarction. Mild white matter hypodensity. No cortical encephalomalacia identified. Vascular:  Calcified atherosclerosis at the skull base. No suspicious intracranial vascular hyperdensity. Skull: Negative. Sinuses/Orbits: Visualized paranasal sinuses and mastoids are stable and well pneumatized. Other: Visualized orbits and scalp soft tissues are within normal limits. IMPRESSION: Stable mild white matter changes. No acute intracranial abnormality. Electronically Signed   By: Genevie Ann M.D.   On: 08/07/2018 18:09   Dg Chest Portable 1 View  Result Date: 08/07/2018 CLINICAL DATA:  Altered mental status and hyperglycemia EXAM: PORTABLE CHEST 1 VIEW COMPARISON:  Jul 26, 2018 FINDINGS: There is bibasilar atelectasis. There is no edema or consolidation. Heart is upper normal in size with pulmonary vascularity normal. No adenopathy. There is degenerative change in the thoracic spine. IMPRESSION: Mild bibasilar atelectasis. No edema or consolidation. Stable cardiac silhouette. Electronically Signed   By: Lowella Grip III M.D.   On: 08/07/2018 12:46      IMPRESSION AND PLAN:  1. sepsis - Lactic acid 2.0 - Possibly secondary to urinary tract infection. - Broad-spectrum IV antibiotic therapy has been initiated with cefepime, vancomycin, and Flagyl.  Patient received 1 L normal saline bolus in the emergency room with continued normal saline infusing at 75 cc/h to peripheral IV. - Blood and urine cultures are pending. - No evidence of infection seen on chest x-ray. - Repeat CBC and BMP in the a.m.  2.  Diabetes mellitus with hyperglycemia - With hemoglobin A1c 8.1 and glucose 311 on arrival - Lantus insulin restarted - We will treat with moderate sliding scale insulin - Diabetic educator consulted  3.  UTI --Urine culture pending - IV cefepime  4.  Acute on chronic renal failure - May be secondary to urinary tract infection - We will continue to monitor renal function closely with repeated BMP in the a.m.  5.  Hypertension - Cozaar restarted -We will treat with IV hydralazine as needed  for persistent hypertension  DVT and PPI prophylaxis have  been initiated.    All the records are reviewed and case discussed with ED provider. The plan of care was discussed in details with the patient (and family). I answered all questions. The patient agreed to proceed with the above mentioned plan. Further management will depend upon hospital course.   CODE STATUS: Full code  TOTAL TIME TAKING CARE OF THIS PATIENT: 45 minutes.    [place] on 08/07/2018 at 9:21 PM  Pager - [phone]  After 6pm go to [email or web address] - [employer] [facility]  Office  [phone]  CC: Primary care physician; [facility]   Note: This dictation was prepared with Dragon dictation along with smaller phrase technology. Any transcriptional errors that result from this process are unintentional.

## 2018-08-07 NOTE — ED Notes (Signed)
PA made aware of headache and pt having trouble ambulating

## 2018-08-07 NOTE — Progress Notes (Signed)
Pharmacy Antibiotic Note  Theresa Booker is a 73 y.o. female admitted on 08/07/2018 with sepsis.  Pharmacy has been consulted for Vancomycin, Cefepime dosing.  Plan: Will start Cefepime 1 gm IV Q12H.   Vancomycin 1500 mg IV X 1 ordered to be given on 6/4 @ ~ 2200. Vancomycin 1250 mg IV Q48H ordered to start on 6/6 @ 2200. No peak or trough currently ordered.   CrCl (IBW) = 23 ml/min Ke = 0.023 hr-1 T1/2 = 29.5  Vd = 52.2   AUC = 509  Vanc trough = 11.9   Height: 5\' 1"  (154.9 cm) Weight: 159 lb 13.3 oz (72.5 kg) IBW/kg (Calculated) : 47.8  Temp (24hrs), Avg:98.2 F (36.8 C), Min:98.2 F (36.8 C), Max:98.2 F (36.8 C)  Recent Labs  Lab 08/07/18 1201 08/07/18 1436 08/07/18 1549 08/07/18 1848 08/07/18 2057  WBC 15.8*  --   --   --   --   CREATININE 1.66* 1.62*  --   --   --   LATICACIDVEN 3.3*  --  2.3* 2.0* 2.1*    Estimated Creatinine Clearance: 27.8 mL/min (A) (by C-G formula based on SCr of 1.62 mg/dL (H)).    Allergies  Allergen Reactions  . Ace Inhibitors Other (See Comments) and Cough    Constant persistent cough  . Pollen Extracts [Pollen Extract] Other (See Comments)    Sneezing/watery eyes/itchy eye  . Vicodin [Hydrocodone-Acetaminophen] Nausea And Vomiting    Antimicrobials this admission:   >>    >>   Dose adjustments this admission:   Microbiology results:  BCx:   UCx:    Sputum:    MRSA PCR:   Thank you for allowing pharmacy to be a part of this patient's care.  Jerrold Haskell D 08/07/2018 9:55 PM

## 2018-08-07 NOTE — ED Provider Notes (Signed)
[facility] Emergency Department Provider Note  ____________________________________________   None    (approximate)  I have reviewed the triage vital signs and the nursing notes.   HISTORY  Chief Complaint Hyperglycemia and Altered Mental Status    HPI Theresa Booker is a 73 y.o. female presents emergency department via EMS from home.  Patient is reported to have elevated glucose at 488 and altered mental status at home.  Family is available to help her with her medications but not routinely.  Patient was also seen here on 08/02/2018 and was diagnosed plantar fasciitis.  Patient was given a 12-day prednisone pack.    Past Medical History:  Diagnosis Date  . Asthma   . Atypical chest pain    neg ETT at [facility]  . CKD (chronic kidney disease), stage III ([place])   . Depression   . Diabetes mellitus without complication ([place])   . Fibromyalgia   . HLD (hyperlipidemia)   . Hypertension   . Vitamin D deficiency     Patient Active Problem List   Diagnosis Date Noted  . AMS (altered mental status) 07/30/2018  . Altered mental status 07/29/2018  . Cocaine use 05/03/2015  . Abnormal MRI, lumbar spine 05/03/2015  . Numbness in feet 04/29/2015  . Gait instability 04/29/2015  . BP (high blood pressure) 04/13/2015  . Fibromyalgia 04/13/2015  . Essential (primary) hypertension 04/13/2015  . Clinical depression 04/13/2015  . Chronic kidney disease (CKD), stage III (moderate) ([place]) 04/13/2015  . Airway hyperreactivity 04/13/2015  . Chronic knee pain (Right) 04/13/2015  . Chronic low back pain (Location of Secondary source of pain) (Bilateral) (R>L) 04/13/2015  . Chronic lower straight pain (Location of Primary Source of Pain) (Right) 04/13/2015  . Chronic lumbar radicular pain (Right) (L5 Dermatome) 04/13/2015  . At high risk for falls 04/13/2015  . Chronic hip pain (Right) 04/13/2015  . Chronic sacroiliac joint pain (Bilateral) (R>L) 04/13/2015  .  Controlled type 2 diabetes mellitus without complication ([place]) 03/13/3233  . Headache disorder 02/01/2015  . Dizziness 02/01/2015  . Bad posture 02/01/2015  . Episodic tension type headache 01/10/2015  . Amnesia 10/18/2014  . 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 Acid reflux 09/24/2013  . Chronic pain 09/24/2013  . Avitaminosis D 02/11/2013    Past Surgical History:  Procedure Laterality Date  . ABDOMINAL HYSTERECTOMY    . COLONOSCOPY WITH PROPOFOL N/A 05/22/2016   Procedure: COLONOSCOPY WITH PROPOFOL;  Surgeon: Lollie Sails, MD;  Location: [facility] ENDOSCOPY;  Service: Endoscopy;  Laterality: N/A;  . ESOPHAGOGASTRODUODENOSCOPY (EGD) WITH PROPOFOL N/A 05/22/2016   Procedure: ESOPHAGOGASTRODUODENOSCOPY (EGD) WITH PROPOFOL;  Surgeon: Lollie Sails, MD;  Location: [facility] ENDOSCOPY;  Service: Endoscopy;  Laterality: N/A;  . HAND SURGERY Right 2014   Dr Rudene Christians  . rotator cuff surgery Left 03/2013   DR Rudene Christians  . SHOULDER ARTHROSCOPY WITH OPEN ROTATOR CUFF REPAIR Right 04/25/2017   Procedure: SHOULDER ARTHROSCOPY WITH OPEN ROTATOR CUFF REPAIR;  Surgeon: Corky Mull, MD;  Location: [facility] ORS;  Service: Orthopedics;  Laterality: Right;  . SHOULDER ARTHROSCOPY WITH OPEN ROTATOR CUFF REPAIR Right 03/11/2018   Procedure: SHOULDER ARTHROSCOPY WITH Recurrent  OPEN ROTATOR CUFF REPAIR;  Surgeon: Corky Mull, MD;  Location: [facility] ORS;  Service: Orthopedics;  Laterality: Right;  debridment and decompression    Prior to Admission medications   Medication Sig Start Date End Date Taking? Authorizing Provider  acetaminophen (TYLENOL) 500 MG tablet Take 500-1,000 mg by mouth every  6 (six) hours as needed for mild pain or headache.    Yes [provider]  albuterol (PROAIR HFA) 108 (90 BASE) MCG/ACT inhaler Inhale 2 puffs into the lungs every 6 (six) hours as needed for wheezing or shortness of breath.    Yes [provider]  aspirin EC 81 MG tablet Take 1 tablet  (81 mg total) by mouth daily. 07/30/18 07/30/19 Yes Salary, Avel Peace, MD  benzonatate (TESSALON PERLES) 100 MG capsule Take 1 capsule (100 mg total) by mouth every 6 (six) hours as needed for cough. 07/22/18 07/22/19 Yes Harvest Dark, MD  Cholecalciferol (VITAMIN D3) 50 MCG (2000 UT) TABS Take 2,000 Units by mouth daily.   Yes [provider]  diclofenac sodium (VOLTAREN) 1 % GEL Apply 2 g topically 2 (two) times daily as needed. Patient taking differently: Apply 2 g topically 2 (two) times daily as needed (for pain).  09/30/17  Yes Merlyn Lot, MD  hydrochlorothiazide (HYDRODIURIL) 25 MG tablet Take 25 mg by mouth daily.    Yes [provider]  Insulin Glargine (LANTUS SOLOSTAR) 100 UNIT/ML Solostar Pen Inject 43 Units into the skin daily.   Yes [provider]  isosorbide mononitrate (IMDUR) 30 MG 24 hr tablet Take 30 mg by mouth daily.   Yes [provider]  loratadine (CLARITIN) 10 MG tablet Take 10 mg by mouth daily.   Yes [provider]  losartan (COZAAR) 25 MG tablet Take 25 mg by mouth daily.   Yes [provider]  Multiple Vitamin (MULTIVITAMIN WITH MINERALS) TABS tablet Take 1 tablet by mouth daily. 07/31/18  Yes Salary, Avel Peace, MD  nortriptyline (PAMELOR) 10 MG capsule Take 30 mg by mouth at bedtime.    Yes [provider]  omeprazole (PRILOSEC) 20 MG capsule Take 20 mg by mouth daily.    Yes [provider]  ondansetron (ZOFRAN ODT) 4 MG disintegrating tablet Take 1 tablet (4 mg total) by mouth every 8 (eight) hours as needed for nausea or vomiting. 03/11/18  Yes Poggi, Marshall Cork, MD  oxyCODONE (ROXICODONE) 5 MG immediate release tablet Take 1-2 tablets (5-10 mg total) by mouth every 4 (four) hours as needed. 03/11/18  Yes Poggi, Marshall Cork, MD  sitaGLIPtin (JANUVIA) 100 MG tablet Take 100 mg by mouth daily.   Yes [provider]  SUMAtriptan (IMITREX) 100 MG tablet Take 100 mg by mouth every 2 (two) hours as  needed for migraine. May repeat in 2 hours if headache persists or recurs.   Yes [provider]  venlafaxine XR (EFFEXOR-XR) 150 MG 24 hr capsule Take 150 mg by mouth daily with breakfast.    Yes [provider]    Allergies Ace inhibitors; Pollen extracts [pollen extract]; and Vicodin [hydrocodone-acetaminophen]  Family History  Problem Relation Age of Onset  . Diabetes Mother   . Breast cancer Mother 34  . Cancer Sister   . Cancer Father   . Liver disease Brother   . Breast cancer Maternal Aunt   . Breast cancer Cousin        maternal    Social History Social History   Tobacco Use  . Smoking status: Never Smoker  . Smokeless tobacco: Never Used  Substance Use Topics  . Alcohol use: No    Alcohol/week: 0.0 standard drinks  . Drug use: No    Review of Systems  Constitutional: No fever/chills, weakness and AMS Eyes: No visual changes. ENT: No sore throat. Respiratory: Denies cough Gastrointestinal: Positive  for increased thirst and urinary frequency Genitourinary: Negative for dysuria. Musculoskeletal: Negative for back pain. Skin: Negative for rash.    ____________________________________________   PHYSICAL EXAM:  VITAL SIGNS: ED Triage Vitals  Enc Vitals Group     BP 08/07/18 1153 (!) 166/111     Pulse Rate 08/07/18 1153 90     Resp 08/07/18 1153 (!) 24     Temp 08/07/18 1153 98.2 F (36.8 C)     Temp src --      SpO2 08/07/18 1149 94 %     Weight 08/07/18 1154 159 lb 13.3 oz (72.5 kg)     Height 08/07/18 1154 5\' 1"  (1.549 m)     Head Circumference --      Peak Flow --      Pain Score 08/07/18 1154 0     Pain Loc --      Pain Edu? --      Excl. in [place]? --     Constitutional: Alert and oriented. Well appearing and in no acute distress. Eyes: Conjunctivae are normal.  Head: Atraumatic. Nose: No congestion/rhinnorhea. Mouth/Throat: Mucous membranes are moist.   Neck:  supple no lymphadenopathy noted Cardiovascular: Normal  rate, regular rhythm. Heart sounds are normal Respiratory: Normal respiratory effort.  No retractions, lungs c t a, cough is wet Abd: soft nontender bs normal all 4 quad GU: deferred Musculoskeletal: FROM all extremities, warm and well perfused Neurologic:  Normal speech and language.  Skin:  Skin is warm, dry and intact. No rash noted. Psychiatric: Mood and affect are normal. Speech and behavior are normal.  ____________________________________________   LABS (all labs ordered are listed, but only abnormal results are displayed)  Labs Reviewed  COMPREHENSIVE METABOLIC PANEL - Abnormal; Notable for the following components:      Result Value   Sodium 132 (*)    Chloride 96 (*)    CO2 19 (*)    Glucose, Bld 510 (*)    BUN 40 (*)    Creatinine, Ser 1.66 (*)    Calcium 8.8 (*)    Albumin 3.4 (*)    Alkaline Phosphatase 135 (*)    GFR calc non Af Amer 30 (*)    GFR calc Af Amer 35 (*)    Anion gap 17 (*)    All other components within normal limits  LACTIC ACID, PLASMA - Abnormal; Notable for the following components:   Lactic Acid, Venous 3.3 (*)    All other components within normal limits  LACTIC ACID, PLASMA - Abnormal; Notable for the following components:   Lactic Acid, Venous 2.3 (*)    All other components within normal limits  CBC WITH DIFFERENTIAL/PLATELET - Abnormal; Notable for the following components:   WBC 15.8 (*)    Neutro Abs 12.8 (*)    Abs Immature Granulocytes 0.16 (*)    All other components within normal limits  URINALYSIS, COMPLETE (UACMP) WITH MICROSCOPIC - Abnormal; Notable for the following components:   Color, Urine STRAW (*)    APPearance CLEAR (*)    Glucose, UA >=500 (*)    Protein, ur 30 (*)    Bacteria, UA RARE (*)    All other components within normal limits  GLUCOSE, CAPILLARY - Abnormal; Notable for the following components:   Glucose-Capillary 488 (*)    All other components within normal limits  GLUCOSE, CAPILLARY - Abnormal;  Notable for the following components:   Glucose-Capillary 262 (*)    All other components within normal limits  BASIC METABOLIC PANEL - Abnormal; Notable for the following components:   Sodium 134 (*)    Glucose, Bld 311 (*)    BUN 36 (*)    Creatinine, Ser 1.62 (*)    GFR calc non Af Amer 31 (*)    GFR calc Af Amer 36 (*)    All other components within normal limits  LACTIC ACID, PLASMA - Abnormal; Notable for the following components:   Lactic Acid, Venous 2.0 (*)    All other components within normal limits  SARS CORONAVIRUS 2 (HOSPITAL ORDER, [place] LAB)  CULTURE, BLOOD (ROUTINE X 2)  CULTURE, BLOOD (ROUTINE X 2)  TROPONIN I  LACTIC ACID, PLASMA  CBG MONITORING, ED   ____________________________________________   ____________________________________________  RADIOLOGY  Chest x-ray is normal  ____________________________________________   PROCEDURES  Procedure(s) performed: Insulin 10 units IV, normal saline 1 L IV   Procedures    ____________________________________________   INITIAL IMPRESSION / ASSESSMENT AND PLAN / ED COURSE  Pertinent labs & imaging results that were available during my care of the patient were reviewed by me and considered in my medical decision making (see chart for details).   Patient 73 year old female presents emergency department via EMS with elevated glucose and DKA type symptoms with altered mental status.  Physical exam patient is a little confused but is able to answer questions.  Remainder the exam is basically unremarkable other than a wet cough.  DDX: ams, dka, cva, sepsis, uti  Chest x-ray does not show any acute abnormality  CBC has elevated WBC at 15.8, glucose is elevated at 510, BUN is increased at 40, creatinine 1.66, troponin is normal  Patient was given 10 units insulin IV, normal saline 1 L IV    ----------------------------------------- 1:25 PM on  08/07/2018 -----------------------------------------  Patient was sleeping but is able to flex her toes with equal strength bilaterally.  She awoke for a little bit answer 1 question and went back to sleep.  ----------------------------------------- 8:41 PM on 08/07/2018 -----------------------------------------  Patient's lactic acid did decrease with fluids, however patient is still acting a little confused and lethargic.  Discussed this with Danton Sewer who is her daughter-in-law.  She states that she has been to the hospital 6 times in the past month and continues to get worse.  Due to the elevated lactic acids and questionable sepsis along with the hyperglycemia we will have her admitted.  Spoke to the hospitalist for admission.  They will be taking over her care at this time.  Theresa Booker was evaluated in Emergency Department on 08/07/2018 for the symptoms described in the history of present illness. She was evaluated in the context of the global COVID-19 pandemic, which necessitated consideration that the patient might be at risk for infection with the SARS-CoV-2 virus that causes COVID-19. Institutional protocols and algorithms that pertain to the evaluation of patients at risk for COVID-19 are in a state of rapid change based on information released by regulatory bodies including the CDC and federal and state organizations. These policies and algorithms were followed during the patient's care in the ED.  As part of my medical decision making, I reviewed the following data within the [place] History obtained from family, Nursing notes reviewed and incorporated, Labs reviewed see above, EKG interpreted nonspecific ST-T wave changes, Old chart reviewed, Radiograph reviewed chest x-ray normal, Discussed with admitting physician hospitalist, Levada Dy, Evaluated by EM attending Dr. Jimmye Norman, Notes from prior ED visits and [place]  Controlled Substance  Database  ____________________________________________   FINAL CLINICAL IMPRESSION(S) / ED DIAGNOSES  Final diagnoses:  AKI (acute kidney injury) ([place])  Acute sepsis ([place])  Hyperglycemia  Confusion      NEW MEDICATIONS STARTED DURING THIS VISIT:  New Prescriptions   No medications on file     Note:  This document was prepared using Dragon voice recognition software and may include unintentional dictation errors.    Versie Starks, PA-C 08/07/18 2044    Earleen Newport, MD 08/08/18 [phone]

## 2018-08-07 NOTE — ED Provider Notes (Signed)
EKG: Interpreted by me, sinus rhythm with rate of 88 bpm, wide QRS, inferior infarct age indeterminate  Patient was seen and examined by me, presents from home by EMS.  She was altered, encouraged to come by home health nurse.  Also found to have high blood sugar.   Earleen Newport, MD 08/07/18 [phone]

## 2018-08-07 NOTE — ED Triage Notes (Signed)
PT to ED via EMS from home. Home health nurse visited pt for first time today and found AMS and high blood sugar. PT is known diabetic. PT is alert at this time.

## 2018-08-08 LAB — CBC
HCT: 40.6 % (ref 36.0–46.0)
Hemoglobin: 13.4 g/dL (ref 12.0–15.0)
MCH: 28.3 pg (ref 26.0–34.0)
MCHC: 33 g/dL (ref 30.0–36.0)
MCV: 85.8 fL (ref 80.0–100.0)
Platelets: 342 10*3/uL (ref 150–400)
RBC: 4.73 MIL/uL (ref 3.87–5.11)
RDW: 13.6 % (ref 11.5–15.5)
WBC: 19.5 10*3/uL — ABNORMAL HIGH (ref 4.0–10.5)
nRBC: 0 % (ref 0.0–0.2)

## 2018-08-08 LAB — GLUCOSE, CAPILLARY
Glucose-Capillary: 218 mg/dL — ABNORMAL HIGH (ref 70–99)
Glucose-Capillary: 240 mg/dL — ABNORMAL HIGH (ref 70–99)
Glucose-Capillary: 291 mg/dL — ABNORMAL HIGH (ref 70–99)
Glucose-Capillary: 264 mg/dL — ABNORMAL HIGH (ref 70–99)

## 2018-08-08 LAB — BASIC METABOLIC PANEL
Anion gap: 9 (ref 5–15)
BUN: 30 mg/dL — ABNORMAL HIGH (ref 8–23)
CO2: 24 mmol/L (ref 22–32)
Calcium: 8.5 mg/dL — ABNORMAL LOW (ref 8.9–10.3)
Chloride: 104 mmol/L (ref 98–111)
Creatinine, Ser: 1.27 mg/dL — ABNORMAL HIGH (ref 0.44–1.00)
GFR calc Af Amer: 48 mL/min — ABNORMAL LOW (ref >60)
GFR calc non Af Amer: 42 mL/min — ABNORMAL LOW (ref >60)
Glucose, Bld: 280 mg/dL — ABNORMAL HIGH (ref 70–99)
Potassium: 4 mmol/L (ref 3.5–5.1)
Sodium: 137 mmol/L (ref 135–145)

## 2018-08-08 LAB — PROTIME-INR
INR: 1.1 (ref 0.8–1.2)
Prothrombin Time: 14 seconds (ref 11.4–15.2)

## 2018-08-08 LAB — CORTISOL-AM, BLOOD: Cortisol - AM: 2.9 ug/dL — ABNORMAL LOW (ref 6.7–22.6)

## 2018-08-08 LAB — MRSA PCR SCREENING: MRSA by PCR: NEGATIVE

## 2018-08-08 LAB — PROCALCITONIN: Procalcitonin: <0.1 ng/mL

## 2018-08-08 MED ORDER — VANCOMYCIN HCL IN DEXTROSE 750-5 MG/150ML-% IV SOLN: 750.0000 mg | INTRAVENOUS | Status: DC

## 2018-08-08 MED ORDER — INSULIN ASPART 100 UNIT/ML ~~LOC~~ SOLN
3.0000 [IU] | Freq: Three times a day (TID) | SUBCUTANEOUS | Status: DC
  Administered 2018-08-08 – 2018-08-11 (×8): 3 [IU] via SUBCUTANEOUS
  Filled 2018-08-08 (×8): qty 1

## 2018-08-08 MED ORDER — SODIUM CHLORIDE 0.9 % IV SOLN
2.0000 g | Freq: Two times a day (BID) | INTRAVENOUS | Status: DC
  Administered 2018-08-08 – 2018-08-10 (×5): 2 g via INTRAVENOUS
  Filled 2018-08-08 (×7): qty 2

## 2018-08-08 NOTE — Progress Notes (Addendum)
Pharmacy Antibiotic Note  Theresa Booker is a 73 y.o. female admitted on 08/07/2018 with sepsis.  Pharmacy has been consulted for Vancomycin, Cefepime dosing.  Baseline Scr (07/30/2018): 1.53 mg/dL BMI: 32.12  Plan: 1) Will start Cefepime 2 gm IV Q12H.   2) Vancomycin 1500 mg IV X 1 ordered to be given on 6/4 @ ~ 2200. Vancomycin 750 mg IV Q36H ordered to start on 6/6 @ 1000. No peak or trough currently ordered.   CrCl (IBW) = 36.5 ml/min Used Scr: 1.27 mg/dL  Expected AUC = 451.3 Expected Vanc trough = 11.0  Height: 5\' 1"  (154.9 cm) Weight: 169 lb 15.6 oz (77.1 kg) IBW/kg (Calculated) : 47.8  Temp (24hrs), Avg:98.1 F (36.7 C), Min:97.7 F (36.5 C), Max:98.2 F (36.8 C)  Recent Labs  Lab 08/07/18 1201 08/07/18 1436 08/07/18 1549 08/07/18 1848 08/07/18 2057 08/08/18 0519  WBC 15.8*  --   --   --   --  19.5*  CREATININE 1.66* 1.62*  --   --   --  1.27*  LATICACIDVEN 3.3*  --  2.3* 2.0* 2.1*  --     Estimated Creatinine Clearance: 36.5 mL/min (A) (by C-G formula based on SCr of 1.27 mg/dL (H)).    Allergies  Allergen Reactions  . Ace Inhibitors Other (See Comments) and Cough    Constant persistent cough  . Pollen Extracts [Pollen Extract] Other (See Comments)    Sneezing/watery eyes/itchy eye  . Vicodin [Hydrocodone-Acetaminophen] Nausea And Vomiting    Antimicrobials this admission: 6/4 Vanc 6/4 Cefepime  6/5 Metronidazole   Dose adjustments this admission: N/A  Microbiology results: 6/4  BCx: NGTD   6/5 MRSA PCR: negative  Thank you for allowing pharmacy to be a part of this patient's care.  Rowland Lathe 08/08/2018 10:00 AM

## 2018-08-08 NOTE — Evaluation (Signed)
Physical Therapy Evaluation Patient Details Name: Theresa Booker MRN: [id number] DOB: Dec 25, 1945 Today's Date: 08/08/2018   History of Present Illness  presented to ER secondary to elevated FSBS, AMS; admitted with sepsis related to UTI, hyperglycemia.  Clinical Impression  Upon evaluation, patient alert and oriented to basic information; follow simple commands, and demonstrates good effort with all mobility tasks.  Bilat UE/LE strength and ROM grossly symmetrical and WFL; no focal weakness appreciated.  Does endorse R heel pain; previously diagnosed with plantar fasciitis per chart.  Able to complete sit/stand, basic transfers and gait (50') without assist device, min assist; lateral sway/LOB with head turns requiring min assist for correction during gait trial without assist device.  Does improve in safely and overall fluidity/stability with use of RW (150', cga/min assist), but still recommend +1 for optimal safety. Does report mild dizziness with transition to upright-vitals assessed end of session.  Seated BP 128/96, HR 98; standing BP 112/88, HR 96.  May benefit from formal orthostatic assessment in subsequent sessions. Would benefit from skilled PT to address above deficits and promote optimal return to PLOF; Recommend transition to [place] upon discharge from acute hospitalization.     Follow Up Recommendations Home health PT;Supervision/Assistance - 24 hour    Equipment Recommendations  Rolling walker with 5" wheels    Recommendations for Other Services       Precautions / Restrictions Precautions Precautions: Fall Restrictions Weight Bearing Restrictions: No      Mobility  Bed Mobility               General bed mobility comments: seated in recliner beginning/end of treatment session  Transfers Overall transfer level: Needs assistance Equipment used: Rolling walker (2 wheeled) Transfers: Sit to/from Stand Sit to Stand: Min guard         General transfer comment:  completed with and without RW; does require UE support for lift off and overall stabilization  Ambulation/Gait Ambulation/Gait assistance: Min assist Gait Distance (Feet): 50 Feet         General Gait Details: short, choppy steps; limited balance reactions, requiring LE step strategy and +1 assist for balance recovery multiple times with dynamic gait components  Stairs            Wheelchair Mobility    Modified Rankin (Stroke Patients Only)       Balance Overall balance assessment: Needs assistance Sitting-balance support: No upper extremity supported;Feet supported Sitting balance-Leahy Scale: Good     Standing balance support: No upper extremity supported Standing balance-Leahy Scale: Poor Standing balance comment: standing functional reach 3-4" (indicative of increased fall risk), min assist to return to midline         [place] - Eyes Opened: 12 [place] - Eyes Closed: 4                 Pertinent Vitals/Pain Pain Assessment: Faces Faces Pain Scale: Hurts little more Pain Location: R heel Pain Descriptors / Indicators: Grimacing;Guarding Pain Intervention(s): Limited activity within patient's tolerance;Monitored during session;Repositioned    Home Living Family/patient expects to be discharged to:: Private residence Living Arrangements: (grandson) Available Help at Discharge: Family;Available PRN/intermittently   Home Access: Stairs to enter   Entrance Stairs-Number of Steps: 5-7 Home Layout: One level Home Equipment: Cane - single point;Walker - 2 wheels      Prior Function Level of Independence: Independent with assistive device(s)         Comments: Mod indep for ADLs, household and community mobilization, intermittent use of SPC;  does report driving and indep completing community errands/needs.  Question accuracy due to persistent confusion; will verify with family as available.     Hand Dominance        Extremity/Trunk Assessment    Upper Extremity Assessment Upper Extremity Assessment: Overall WFL for tasks assessed    Lower Extremity Assessment Lower Extremity Assessment: Overall WFL for tasks assessed(grossly 4/5 throughout; no focal weakness, sensory deficit appreciated or reported)       Communication   Communication: No difficulties  Cognition Arousal/Alertness: Awake/alert Behavior During Therapy: WFL for tasks assessed/performed Overall Cognitive Status: No family/caregiver present to determine baseline cognitive functioning                                 General Comments: oriented to self, location, month and year; follows commands; limited recall of new information, limited insight into deficits and safety needs      General Comments      Exercises Other Exercises Other Exercises: 150' with RW, cga/min assist-improved safety and stability with use of RW, but does still require +1 for optimal safety (continued sway, LOB wtih head turns-especially vertical)   Assessment/Plan    PT Assessment Patient needs continued PT services  PT Problem List Decreased balance;Decreased strength;Decreased range of motion;Decreased activity tolerance;Decreased mobility;Decreased coordination;Decreased safety awareness;Decreased knowledge of use of DME;Decreased cognition;Pain       PT Treatment Interventions DME instruction;Gait training;Stair training;Therapeutic exercise;Therapeutic activities;Functional mobility training;Balance training;Neuromuscular re-education;Manual techniques;Patient/family education    PT Goals (Current goals can be found in the Care Plan section)  Acute Rehab PT Goals Patient Stated Goal: to return home PT Goal Formulation: With patient Time For Goal Achievement: 08/22/18 Potential to Achieve Goals: Good    Frequency Min 2X/week   Barriers to discharge Inaccessible home environment;Decreased caregiver support      Co-evaluation               AM-PAC PT  "6 Clicks" Mobility  Outcome Measure Help needed turning from your back to your side while in a flat bed without using bedrails?: None Help needed moving from lying on your back to sitting on the side of a flat bed without using bedrails?: None Help needed moving to and from a bed to a chair (including a wheelchair)?: A Little Help needed standing up from a chair using your arms (e.g., wheelchair or bedside chair)?: A Little Help needed to walk in hospital room?: A Little Help needed climbing 3-5 steps with a railing? : A Little 6 Click Score: 20    End of Session Equipment Utilized During Treatment: Gait belt Activity Tolerance: Patient tolerated treatment well Patient left: in chair;with call bell/phone within reach;with chair alarm set Nurse Communication: Mobility status PT Visit Diagnosis: Unsteadiness on feet (R26.81);History of falling (Z91.81);Difficulty in walking, not elsewhere classified (R26.2);Other symptoms and signs involving the nervous system (R29.898);Pain;Dizziness and giddiness (R42) Pain - Right/Left: Right Pain - part of body: Ankle and joints of foot    Time: 8588-5027 PT Time Calculation (min) (ACUTE ONLY): 30 min   Charges:   PT Evaluation $PT Eval Moderate Complexity: 1 Mod PT Treatments $Gait Training: 8-22 mins       Nijah Tejera H. Owens Shark, PT, DPT, NCS 08/08/18, 11:08 AM [phone]

## 2018-08-08 NOTE — Progress Notes (Signed)
Inpatient Diabetes Program Recommendations  AACE/ADA: New Consensus Statement on Inpatient Glycemic Control   Target Ranges:  Prepandial:   less than 140 mg/dL      Peak postprandial:   less than 180 mg/dL (1-2 hours)      Critically ill patients:  140 - 180 mg/dL   Results for Theresa Booker, Theresa Booker (MRN [id number]) as of 08/08/2018 13:57  Ref. Range 08/07/2018 11:51 08/07/2018 14:11 08/07/2018 19:41 08/07/2018 22:32 08/08/2018 07:42 08/08/2018 11:44  Glucose-Capillary Latest Ref Range: 70 - 99 mg/dL 488 (H) 262 (H) 277 (H) 320 (H) 218 (H) 240 (H)   Review of Glycemic Control  Diabetes history: DM2 Outpatient Diabetes medications: Lantus 43 units QHS, Januvia 100 mg daily Current orders for Inpatient glycemic control: Lantus 43 units daily, Novolog 0-15 units TID with meals, Novolog 0-5 units QHS, Tradjenta 5 mg daily  Inpatient Diabetes Program Recommendations:   Insulin-Meal Coverage: If post prandial glucose is consistently greater than 180 mg/dl, please consider ordering Novolog 3 units TID with meals for meal coverage.  HgbA1C: A1C 8.1% on 07/30/18 indicating an average glucose of 186 mg/dl over the past 2-3 months.  NOTE: Noted consult for Diabetes Coordinator. Spoke with patient about diabetes and home regimen for diabetes control. Patient reports being followed by PCP for diabetes management and currently taking Lantus 43 units QHS and Januvia 100 mg daily as an outpatient for diabetes control. Patient reports taking DM medications as prescribed. Patient has not checked glucose in over 2 weeks because she has lost her glucometer. Will asked MD to provide Rx for new glucometer at time of discharge. Discussed A1C results (8.1% on 07/30/18) and explained that current A1C indicates an average glucose of 186 mg/dl over the past 2-3 months. Discussed glucose and A1C goals. Discussed importance of checking CBGs and maintaining good CBG control to prevent long-term and short-term complications.  Discussed impact of  nutrition, exercise, stress, sickness, and medications on diabetes control. Informed patient it was noted that she went to the Emergency Department on 08/02/18 and was dx with plantar fascitis and was discharged on Prednisone 12 day dose pack.  Explained how Prednisone impacts glucose.  Encouraged patient to be sure to talk with the provider any time she is prescribed steroids to see if she needs to make adjustments with DM medications while taking steroids.  Encouraged patient to get a new glucometer and testing supplies and to check glucose 2-3 times a day to ensure it is trending well. Also encouraged patient to reach out to her provider if glucose is staying consistently over 180 mg/dl.  Patient verbalized understanding of information discussed and reports no further questions at this time related to diabetes.  Thanks, Barnie Alderman, RN, MSN, CDE Diabetes Coordinator Inpatient Diabetes Program [phone] (Team Pager)

## 2018-08-08 NOTE — Progress Notes (Signed)
[facility] [place] NAME: Theresa Booker    MR#:  [id number]  DATE OF BIRTH:  07-22-1945  SUBJECTIVE:  CHIEF COMPLAINT:   Chief Complaint  Patient presents with  . Hyperglycemia  . Altered Mental Status   Patient has no complaints. REVIEW OF SYSTEMS:  Review of Systems  Constitutional: Negative for chills, fever and malaise/fatigue.  HENT: Negative for sore throat.   Eyes: Negative for blurred vision and double vision.  Respiratory: Negative for cough, hemoptysis, shortness of breath, wheezing and stridor.   Cardiovascular: Negative for chest pain, palpitations, orthopnea and leg swelling.  Gastrointestinal: Negative for abdominal pain, blood in stool, diarrhea, melena, nausea and vomiting.  Genitourinary: Negative for dysuria, flank pain and hematuria.  Musculoskeletal: Negative for back pain and joint pain.  Skin: Negative for rash.  Neurological: Negative for dizziness, sensory change, focal weakness, seizures, loss of consciousness, weakness and headaches.  Endo/Heme/Allergies: Negative for polydipsia.  Psychiatric/Behavioral: Negative for depression. The patient is not nervous/anxious.     DRUG ALLERGIES:   Allergies  Allergen Reactions  . Ace Inhibitors Other (See Comments) and Cough    Constant persistent cough  . Pollen Extracts [Pollen Extract] Other (See Comments)    Sneezing/watery eyes/itchy eye  . Vicodin [Hydrocodone-Acetaminophen] Nausea And Vomiting   VITALS:  Blood pressure 109/78, pulse 100, temperature 97.7 F (36.5 C), temperature source Oral, resp. rate 20, height 5\' 1"  (1.549 m), weight 77.1 kg, SpO2 95 %. PHYSICAL EXAMINATION:  Physical Exam Constitutional:      General: She is not in acute distress. HENT:     Head: Normocephalic.     Mouth/Throat:     Mouth: Mucous membranes are moist.  Eyes:     General: No scleral icterus.    Conjunctiva/sclera: Conjunctivae normal.     Pupils: Pupils are  equal, round, and reactive to light.  Neck:     Musculoskeletal: Normal range of motion and neck supple.     Vascular: No JVD.     Trachea: No tracheal deviation.  Cardiovascular:     Rate and Rhythm: Normal rate and regular rhythm.     Heart sounds: Normal heart sounds. No murmur. No gallop.   Pulmonary:     Effort: Pulmonary effort is normal. No respiratory distress.     Breath sounds: Normal breath sounds. No wheezing or rales.  Abdominal:     General: Bowel sounds are normal. There is no distension.     Palpations: Abdomen is soft.     Tenderness: There is no abdominal tenderness. There is no rebound.  Musculoskeletal: Normal range of motion.        General: No tenderness.     Right lower leg: No edema.     Left lower leg: No edema.  Skin:    Findings: No erythema or rash.  Neurological:     General: No focal deficit present.     Mental Status: She is alert and oriented to person, place, and time.     Cranial Nerves: No cranial nerve deficit.  Psychiatric:        Mood and Affect: Mood normal.    LABORATORY PANEL:  Female CBC Recent Labs  Lab 08/08/18 0519  WBC 19.5*  HGB 13.4  HCT 40.6  PLT 342   ------------------------------------------------------------------------------------------------------------------ Chemistries  Recent Labs  Lab 08/07/18 1201  08/08/18 0519  NA 132*   < > 137  K 4.5   < > 4.0  CL  96*   < > 104  CO2 19*   < > 24  GLUCOSE 510*   < > 280*  BUN 40*   < > 30*  CREATININE 1.66*   < > 1.27*  CALCIUM 8.8*   < > 8.5*  AST 21  --   --   ALT 20  --   --   ALKPHOS 135*  --   --   BILITOT 0.3  --   --    < > = values in this interval not displayed.   RADIOLOGY:  Ct Head Wo Contrast  Result Date: 08/07/2018 CLINICAL DATA:  73 year old female with altered mental status, headache, ataxia, hyperglycemia. EXAM: CT HEAD WITHOUT CONTRAST TECHNIQUE: Contiguous axial images were obtained from the base of the skull through the vertex without  intravenous contrast. COMPARISON:  Brain MRI and head CT 07/29/2018 and earlier. FINDINGS: Brain: Stable gray-white matter differentiation throughout the brain. No midline shift, ventriculomegaly, mass effect, evidence of mass lesion, intracranial hemorrhage or evidence of cortically based acute infarction. Mild white matter hypodensity. No cortical encephalomalacia identified. Vascular: Calcified atherosclerosis at the skull base. No suspicious intracranial vascular hyperdensity. Skull: Negative. Sinuses/Orbits: Visualized paranasal sinuses and mastoids are stable and well pneumatized. Other: Visualized orbits and scalp soft tissues are within normal limits. IMPRESSION: Stable mild white matter changes. No acute intracranial abnormality. Electronically Signed   By: Genevie Ann M.D.   On: 08/07/2018 18:09   ASSESSMENT AND PLAN:   1. sepsis, unclear source.  Suspectedurinary tract infection but the patient has no complaints of dysuria or urine frequency, urinalysis is unremarkable.  Still leukocytosis. Continue IV antibiotic  Cefepime and Flagyl.  Discontinue vancomycin. - No evidence of infection seen on chest x-ray. Follow-up CBC and blood culture.  Lactic acidosis.  Treatment as above.  2.  Diabetes mellitus with hyperglycemia Continue Lantus and moderate sliding scale insulin, add NovoLog 3 units AC.3.  Acute on chronic renal failure Continue IV fluid support.  5.  Hypertension Continue Cozaar, IV hydralazine as needed for persistent hypertension  I called the patient's relative, Ms. Glennon Mac, but nobody answered the phone. All the records are reviewed and case discussed with Care Management/Social Worker. Management plans discussed with the patient, family and they are in agreement.  CODE STATUS: Full Code  TOTAL TIME TAKING CARE OF THIS PATIENT: 32 minutes.   More than 50% of the time was spent in counseling/coordination of care: YES  POSSIBLE D/C IN 2 DAYS, DEPENDING ON CLINICAL  CONDITION.  Demetrios Loll M.D on 08/08/2018 at 2:21 PM  Between 7am to 6pm - Pager - [phone]  After 6pm go to [email or web address] - [facility]

## 2018-08-09 ENCOUNTER — Inpatient Hospital Stay: Payer: Medicare Other

## 2018-08-09 LAB — GLUCOSE, CAPILLARY
Glucose-Capillary: 143 mg/dL — ABNORMAL HIGH (ref 70–99)
Glucose-Capillary: 265 mg/dL — ABNORMAL HIGH (ref 70–99)
Glucose-Capillary: 126 mg/dL — ABNORMAL HIGH (ref 70–99)
Glucose-Capillary: 159 mg/dL — ABNORMAL HIGH (ref 70–99)

## 2018-08-09 NOTE — TOC Initial Note (Signed)
Transition of Care [facility]) - Initial/Assessment Note    Patient Details  Name: Theresa Booker MRN: [id number] Date of Birth: 1946-03-02  Transition of Care [facility]) CM/SW Contact:    Latanya Maudlin, RN Phone Number: 08/09/2018, 11:52 AM  Clinical Narrative:  TOC met with patient to discuss high risk readmission screening. Patient was recently discharged with home health via [place] care. Notified Melissa of admission. Patient has a rolling walker at home and requires no other DME. Her grandson is often in the home. Patient was previously driving but due to some confusion,etc she is not anymore. She was given a list of transportation resources last admission. She follow at the [facility] clinic for PCP.                  Expected Discharge Plan: [place] Barriers to Discharge: Continued Medical Work up   Patient Goals and CMS Choice   CMS Medicare.gov Compare Post Acute Care list provided to:: Patient    Expected Discharge Plan and Services Expected Discharge Plan: [place] Choice: Resumption of Svcs/PTA Provider Living arrangements for the past 2 months: Single Family Home                           HH Arranged: RN, OT, PT   Date HH Agency Contacted: 08/09/18 Time [place]: 21 Representative spoke with at [place]: [place] Arrangements/Services Living arrangements for the past 2 months: [place] with:: Self Patient language and need for interpreter reviewed:: Yes Do you feel safe going back to the place where you live?: Yes          Current home services: DME, Home PT, Home RN Criminal Activity/Legal Involvement Pertinent to Current Situation/Hospitalization: No - Comment as needed  Activities of Daily Living Home Assistive Devices/Equipment: None ADL Screening (condition at time of admission) Patient's cognitive ability adequate to safely complete daily activities?:  No Is the patient deaf or have difficulty hearing?: No Does the patient have difficulty seeing, even when wearing glasses/contacts?: No Does the patient have difficulty concentrating, remembering, or making decisions?: No Patient able to express need for assistance with ADLs?: Yes Does the patient have difficulty dressing or bathing?: No Independently performs ADLs?: Yes (appropriate for developmental age) Does the patient have difficulty walking or climbing stairs?: No Weakness of Legs: None Weakness of Arms/Hands: None  Permission Sought/Granted Permission sought to share information with : Case Manager                Emotional Assessment Appearance:: Appears stated age Attitude/Demeanor/Rapport: Gracious Affect (typically observed): Accepting Orientation: : Oriented to Self, Oriented to Place      Admission diagnosis:  Confusion [R41.0] Hyperglycemia [R73.9] AKI (acute kidney injury) ([place]) [N17.9] Acute sepsis [facility]) [A41.9] Patient Active Problem List   Diagnosis Date Noted  . Sepsis ([place]) 08/07/2018  . AMS (altered mental status) 07/30/2018  . Altered mental status 07/29/2018  . Cocaine use 05/03/2015  . Abnormal MRI, lumbar spine 05/03/2015  . Numbness in feet 04/29/2015  . Gait instability 04/29/2015  . BP (high blood pressure) 04/13/2015  . Fibromyalgia 04/13/2015  . Essential (primary) hypertension 04/13/2015  . Clinical depression 04/13/2015  . Chronic kidney disease (CKD), stage III (moderate) ([place]) 04/13/2015  . Airway hyperreactivity 04/13/2015  . Chronic knee pain (Right) 04/13/2015  . Chronic low back pain (  Location of Secondary source of pain) (Bilateral) (R>L) 04/13/2015  . Chronic lower straight pain (Location of Primary Source of Pain) (Right) 04/13/2015  . Chronic lumbar radicular pain (Right) (L5 Dermatome) 04/13/2015  . At high risk for falls 04/13/2015  . Chronic hip pain (Right) 04/13/2015  . Chronic sacroiliac joint pain (Bilateral) (R>L)  04/13/2015  . Controlled type 2 diabetes mellitus without complication ([place]) 18/56/3149  . Headache disorder 02/01/2015  . Dizziness 02/01/2015  . Bad posture 02/01/2015  . Episodic tension type headache 01/10/2015  . Amnesia 10/18/2014  . 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 Acid reflux 09/24/2013  . Chronic pain 09/24/2013  . Avitaminosis D 02/11/2013   PCP:  [facility], [place]:   Levin Erp, [place] [address] [place] [address] [place] [address] Phone: [phone] Fax: [place] [address]), [place]) [place] [address] Phone: [phone] Fax: [phone]     Social Determinants of Health (SDOH) Interventions    Readmission Risk Interventions Readmission Risk Prevention Plan 08/09/2018  Transportation Screening Complete  Medication Review (RN Care Manager) Complete  HRI or Home Care Consult Complete  Some recent data might be hidden

## 2018-08-09 NOTE — Progress Notes (Signed)
[facility] [place] at [place] NAME: Theresa Booker    MR#:  [id number]  DATE OF BIRTH:  [birth year]  SUBJECTIVE: Patient is admitted for sepsis of unknown origin, on IV antibiotics, no fever, urine cultures are pending, denies any complaints, has been having dizziness for 2 weeks but it is better now, denies abdominal pain, diarrhea, vomiting.  CHIEF COMPLAINT:   Chief Complaint  Patient presents with  . Hyperglycemia  . Altered Mental Status   Patient has no complaints. REVIEW OF SYSTEMS:  Review of Systems  Constitutional: Negative for chills, fever and malaise/fatigue.  HENT: Negative for sore throat.   Eyes: Negative for blurred vision and double vision.  Respiratory: Negative for cough, hemoptysis, shortness of breath, wheezing and stridor.   Cardiovascular: Negative for chest pain, palpitations, orthopnea and leg swelling.  Gastrointestinal: Negative for abdominal pain, blood in stool, diarrhea, melena, nausea and vomiting.  Genitourinary: Negative for dysuria, flank pain and hematuria.  Musculoskeletal: Negative for back pain and joint pain.  Skin: Negative for rash.  Neurological: Negative for dizziness, sensory change, focal weakness, seizures, loss of consciousness, weakness and headaches.  Endo/Heme/Allergies: Negative for polydipsia.  Psychiatric/Behavioral: Negative for depression. The patient is not nervous/anxious.     DRUG ALLERGIES:   Allergies  Allergen Reactions  . Ace Inhibitors Other (See Comments) and Cough    Constant persistent cough  . Pollen Extracts [Pollen Extract] Other (See Comments)    Sneezing/watery eyes/itchy eye  . Vicodin [Hydrocodone-Acetaminophen] Nausea And Vomiting   VITALS:  Blood pressure 118/88, pulse 88, temperature 98.4 F (36.9 C), temperature source Oral, resp. rate 20, height 5\' 1"  (1.549 m), weight 77.1 kg, SpO2 97 %. PHYSICAL EXAMINATION:  Physical Exam Constitutional:      General: She  is not in acute distress. HENT:     Head: Normocephalic.     Mouth/Throat:     Mouth: Mucous membranes are moist.  Eyes:     General: No scleral icterus.    Conjunctiva/sclera: Conjunctivae normal.     Pupils: Pupils are equal, round, and reactive to light.  Neck:     Musculoskeletal: Normal range of motion and neck supple.     Vascular: No JVD.     Trachea: No tracheal deviation.  Cardiovascular:     Rate and Rhythm: Normal rate and regular rhythm.     Heart sounds: Normal heart sounds. No murmur. No gallop.   Pulmonary:     Effort: Pulmonary effort is normal. No respiratory distress.     Breath sounds: Normal breath sounds. No wheezing or rales.  Abdominal:     General: Bowel sounds are normal. There is no distension.     Palpations: Abdomen is soft.     Tenderness: There is no abdominal tenderness. There is no rebound.  Musculoskeletal: Normal range of motion.        General: No tenderness.     Right lower leg: No edema.     Left lower leg: No edema.  Skin:    Findings: No erythema or rash.  Neurological:     General: No focal deficit present.     Mental Status: She is alert and oriented to person, place, and time.     Cranial Nerves: No cranial nerve deficit.  Psychiatric:        Mood and Affect: Mood normal.    LABORATORY PANEL:  Female CBC Recent Labs  Lab 08/08/18 0519  WBC 19.5*  HGB 13.4  HCT 40.6  PLT 342   ------------------------------------------------------------------------------------------------------------------ Chemistries  Recent Labs  Lab 08/07/18 1201  08/08/18 0519  NA 132*   < > 137  K 4.5   < > 4.0  CL 96*   < > 104  CO2 19*   < > 24  GLUCOSE 510*   < > 280*  BUN 40*   < > 30*  CREATININE 1.66*   < > 1.27*  CALCIUM 8.8*   < > 8.5*  AST 21  --   --   ALT 20  --   --   ALKPHOS 135*  --   --   BILITOT 0.3  --   --    < > = values in this interval not displayed.   RADIOLOGY:  No results found. ASSESSMENT AND PLAN:   1. sepsis,  unclear source.  Suspectedurinary tract infection but the patient has no complaints of dysuria or urine frequency, urinalysis is unremarkable.  Still leukocytosis. Continue IV antibiotic  Cefepime and Flagyl.  Discontinue vancomycin. - Lactic acidosis.  Treatment as above Blood cultures negative, waiting for urine cultures, COVID-19 test has been negative four times since May 2020. Leukocytosis  Is  worse today, WBC increased from 13.8-19.5.   2.  Diabetes mellitus with hyperglycemia Continue Lantus and moderate sliding scale insulin, add NovoLog 3 units AC Continue Tradjenta.   .3.  Acute on chronic renal failure, patient was feeling dizzy likely dehydration improved with IV fluids, creatinine trended down from 1.66-1.27.  Patient started back on  hctz and losartan. Continue IV fluid support.  5.  Hypertension Continue Cozaar, blood pressure stable.     #6 dizziness, check orthostatic vitals, carotid ultrasound.Rozell Searing the records are reviewed and case discussed with Care Management/Social Worker. Management plans discussed with the patient, family and they are in agreement.  CODE STATUS: Full Code  TOTAL TIME TAKING CARE OF THIS PATIENT: 32 minutes.   More than 50% of the time was spent in counseling/coordination of care: YES  POSSIBLE D/C IN 2 DAYS, DEPENDING ON CLINICAL CONDITION.  Epifanio Lesches M.D on 08/09/2018 at 12:22 PM  Between 7am to 6pm - Pager - [phone]  After 6pm go to [email or web address] - [facility]

## 2018-08-10 LAB — GLUCOSE, CAPILLARY
Glucose-Capillary: 135 mg/dL — ABNORMAL HIGH (ref 70–99)
Glucose-Capillary: 180 mg/dL — ABNORMAL HIGH (ref 70–99)
Glucose-Capillary: 121 mg/dL — ABNORMAL HIGH (ref 70–99)
Glucose-Capillary: 121 mg/dL — ABNORMAL HIGH (ref 70–99)

## 2018-08-10 LAB — CBC
HCT: 43 % (ref 36.0–46.0)
Hemoglobin: 14 g/dL (ref 12.0–15.0)
MCH: 28.6 pg (ref 26.0–34.0)
MCHC: 32.6 g/dL (ref 30.0–36.0)
MCV: 87.8 fL (ref 80.0–100.0)
Platelets: 305 10*3/uL (ref 150–400)
RBC: 4.9 MIL/uL (ref 3.87–5.11)
RDW: 14 % (ref 11.5–15.5)
WBC: 14.7 10*3/uL — ABNORMAL HIGH (ref 4.0–10.5)
nRBC: 0 % (ref 0.0–0.2)

## 2018-08-10 MED ORDER — ENOXAPARIN SODIUM 40 MG/0.4ML ~~LOC~~ SOLN
40.0000 mg | SUBCUTANEOUS | Status: DC
  Administered 2018-08-10: 40 mg via SUBCUTANEOUS
  Filled 2018-08-10: qty 0.4

## 2018-08-10 NOTE — Progress Notes (Signed)
Theresa Booker NAME: Theresa Booker    MR#:  [id number]  DATE OF BIRTH:  1945/08/22  Patient is feeling better but urine cultures are still pending, WBC down to 14.  Carotid ultrasound normal.  CHIEF COMPLAINT:   Chief Complaint  Patient presents with  . Hyperglycemia  . Altered Mental Status   Patient has no complaints. REVIEW OF SYSTEMS:  Review of Systems  Constitutional: Negative for chills, fever and malaise/fatigue.  HENT: Negative for sore throat.   Eyes: Negative for blurred vision and double vision.  Respiratory: Negative for cough, hemoptysis, shortness of breath, wheezing and stridor.   Cardiovascular: Negative for chest pain, palpitations, orthopnea and leg swelling.  Gastrointestinal: Negative for abdominal pain, blood in stool, diarrhea, melena, nausea and vomiting.  Genitourinary: Negative for dysuria, flank pain and hematuria.  Musculoskeletal: Negative for back pain and joint pain.  Skin: Negative for rash.  Neurological: Negative for dizziness, sensory change, focal weakness, seizures, loss of consciousness, weakness and headaches.  Endo/Heme/Allergies: Negative for polydipsia.  Psychiatric/Behavioral: Negative for depression. The patient is not nervous/anxious.     DRUG ALLERGIES:   Allergies  Allergen Reactions  . Ace Inhibitors Other (See Comments) and Cough    Constant persistent cough  . Pollen Extracts [Pollen Extract] Other (See Comments)    Sneezing/watery eyes/itchy eye  . Vicodin [Hydrocodone-Acetaminophen] Nausea And Vomiting   VITALS:  Blood pressure 122/90, pulse 88, temperature 98.2 F (36.8 C), temperature source Oral, resp. rate 16, height 5\' 1"  (1.549 m), weight 77.1 kg, SpO2 98 %. PHYSICAL EXAMINATION:  Physical Exam Constitutional:      General: She is not in acute distress. HENT:     Head: Normocephalic.     Mouth/Throat:     Mouth: Mucous membranes are moist.  Eyes:   General: No scleral icterus.    Conjunctiva/sclera: Conjunctivae normal.     Pupils: Pupils are equal, round, and reactive to light.  Neck:     Musculoskeletal: Normal range of motion and neck supple.     Vascular: No JVD.     Trachea: No tracheal deviation.  Cardiovascular:     Rate and Rhythm: Normal rate and regular rhythm.     Heart sounds: Normal heart sounds. No murmur. No gallop.   Pulmonary:     Effort: Pulmonary effort is normal. No respiratory distress.     Breath sounds: Normal breath sounds. No wheezing or rales.  Abdominal:     General: Bowel sounds are normal. There is no distension.     Palpations: Abdomen is soft.     Tenderness: There is no abdominal tenderness. There is no rebound.  Musculoskeletal: Normal range of motion.        General: No tenderness.     Right lower leg: No edema.     Left lower leg: No edema.  Skin:    Findings: No erythema or rash.  Neurological:     General: No focal deficit present.     Mental Status: She is alert and oriented to person, place, and time.     Cranial Nerves: No cranial nerve deficit.  Psychiatric:        Mood and Affect: Mood normal.    LABORATORY PANEL:  Female CBC Recent Labs  Lab 08/10/18 0914  WBC 14.7*  HGB 14.0  HCT 43.0  PLT 305   ------------------------------------------------------------------------------------------------------------------ Chemistries  Recent Labs  Lab 08/07/18 1201  08/08/18 0519  NA 132*   < >  137  K 4.5   < > 4.0  CL 96*   < > 104  CO2 19*   < > 24  GLUCOSE 510*   < > 280*  BUN 40*   < > 30*  CREATININE 1.66*   < > 1.27*  CALCIUM 8.8*   < > 8.5*  AST 21  --   --   ALT 20  --   --   ALKPHOS 135*  --   --   BILITOT 0.3  --   --    < > = values in this interval not displayed.   RADIOLOGY:  No results found. ASSESSMENT AND PLAN:   1. sepsis, unclear source.  Suspectedurinary tract infection but the patient has no complaints of dysuria or urine frequency,  urine cultures  are still pending cultures are still pending continue IV antibiotic  Cefepime and Flagyl.  Discontinue vancomycin. - Lactic acidosis.  Treatment as above Blood cultures negative, waiting for urine cultures, COVID-19 test has been negative four times since May 2020.   2.  Diabetes mellitus with hyperglycemia Continue Lantus and moderate sliding scale insulin, add NovoLog 3 units AC Continue Tradjenta.   .3.  Acute on chronic renal failure, improved   5.  Hypertension Continue Cozaar, blood pressure stable.     #6 dizziness, ; ultrasound of carotids normal, no orthostatic hypotension.  Likely discharge home, tomorrow, restart home medicines including losartan, HCTZ. IAll the records are reviewed and case discussed with Care Management/Social Worker. Management plans discussed with the patient, family and they are in agreement.  CODE STATUS: Full Code  TOTAL TIME TAKING CARE OF THIS PATIENT: 32 minutes.   More than 50% of the time was spent in counseling/coordination of care: YES  POSSIBLE D/C IN 2 DAYS, DEPENDING ON CLINICAL CONDITION.  Epifanio Lesches M.D on 08/10/2018 at 1:39 PM  Between 7am to 6pm - Pager - [phone]  After 6pm go to [email or web address] - [facility]

## 2018-08-11 LAB — CBC
HCT: 37.8 % (ref 36.0–46.0)
Hemoglobin: 12.3 g/dL (ref 12.0–15.0)
MCH: 28.1 pg (ref 26.0–34.0)
MCHC: 32.5 g/dL (ref 30.0–36.0)
MCV: 86.3 fL (ref 80.0–100.0)
Platelets: 293 10*3/uL (ref 150–400)
RBC: 4.38 MIL/uL (ref 3.87–5.11)
RDW: 14.3 % (ref 11.5–15.5)
WBC: 13.2 10*3/uL — ABNORMAL HIGH (ref 4.0–10.5)
nRBC: 0 % (ref 0.0–0.2)

## 2018-08-11 LAB — GLUCOSE, CAPILLARY
Glucose-Capillary: 79 mg/dL (ref 70–99)
Glucose-Capillary: 177 mg/dL — ABNORMAL HIGH (ref 70–99)

## 2018-08-11 MED ORDER — BLOOD GLUCOSE MONITOR KIT: PACK | 0 refills | Status: AC

## 2018-08-11 NOTE — Care Management Important Message (Signed)
Important Message  Patient Details  Name: Theresa Booker MRN: [id number] Date of Birth: 1945-09-01   Medicare Important Message Given:  Yes    Dannette Barbara 08/11/2018, 11:41 AM

## 2018-08-11 NOTE — Progress Notes (Signed)
Pharmacy Antibiotic Note  Theresa Booker is a 73 y.o. female admitted on 08/07/2018 with sepsis.  Pharmacy has been consulted for Vancomycin, Cefepime dosing.  Baseline Scr (07/30/2018): 1.53 mg/dL BMI: 32.12  Plan: Continue Cefepime 2 gm IV Q12H.    Height: 5\' 1"  (154.9 cm) Weight: 169 lb 15.6 oz (77.1 kg) IBW/kg (Calculated) : 47.8  Temp (24hrs), Avg:98 F (36.7 C), Min:97.8 F (36.6 C), Max:98.2 F (36.8 C)  Recent Labs  Lab 08/07/18 1201 08/07/18 1436 08/07/18 1549 08/07/18 1848 08/07/18 2057 08/08/18 0519 08/10/18 0914 08/11/18 0453  WBC 15.8*  --   --   --   --  19.5* 14.7* 13.2*  CREATININE 1.66* 1.62*  --   --   --  1.27*  --   --   LATICACIDVEN 3.3*  --  2.3* 2.0* 2.1*  --   --   --     Estimated Creatinine Clearance: 36.5 mL/min (A) (by C-G formula based on SCr of 1.27 mg/dL (H)).    Allergies  Allergen Reactions  . Ace Inhibitors Other (See Comments) and Cough    Constant persistent cough  . Pollen Extracts [Pollen Extract] Other (See Comments)    Sneezing/watery eyes/itchy eye  . Vicodin [Hydrocodone-Acetaminophen] Nausea And Vomiting    Antimicrobials this admission: 6/4 Vanc >> 6/5  6/4 Cefepime  6/5 Metronidazole   Dose adjustments this admission: N/A  Microbiology results: 6/4  BCx: NGTD 6/7 UCx sent  6/5 MRSA PCR: negative  Thank you for allowing pharmacy to be a part of this patient's care.  Rocky Morel 08/11/2018 9:51 AM

## 2018-08-11 NOTE — Discharge Summary (Signed)
Theresa Booker, is a 73 y.o. female  DOB 1945/09/15  MRN [id number].  Admission date:  08/07/2018  Admitting Physician  Saundra Shelling, MD  Discharge Date:  08/11/2018   Primary MD  Center, [facility]  Recommendations for primary care physician for things to follow:  Follow with PCP in 1 week   Admission Diagnosis  Confusion [R41.0] Hyperglycemia [R73.9] AKI (acute kidney injury) ([place]) [N17.9] Acute sepsis (HCC) [A41.9]   Discharge Diagnosis  Confusion [R41.0] Hyperglycemia [R73.9] AKI (acute kidney injury) ([place]) [N17.9] Acute sepsis ([place]) [A41.9]    Active Problems:   Sepsis ([place])      Past Medical History:  Diagnosis Date  . Asthma   . Atypical chest pain    neg ETT at [facility]  . CKD (chronic kidney disease), stage III ([place])   . Depression   . Diabetes mellitus without complication ([place])   . Fibromyalgia   . HLD (hyperlipidemia)   . Hypertension   . Vitamin D deficiency     Past Surgical History:  Procedure Laterality Date  . ABDOMINAL HYSTERECTOMY    . COLONOSCOPY WITH PROPOFOL N/A 05/22/2016   Procedure: COLONOSCOPY WITH PROPOFOL;  Surgeon: Lollie Sails, MD;  Location: [facility] ENDOSCOPY;  Service: Endoscopy;  Laterality: N/A;  . ESOPHAGOGASTRODUODENOSCOPY (EGD) WITH PROPOFOL N/A 05/22/2016   Procedure: ESOPHAGOGASTRODUODENOSCOPY (EGD) WITH PROPOFOL;  Surgeon: Lollie Sails, MD;  Location: [facility] ENDOSCOPY;  Service: Endoscopy;  Laterality: N/A;  . HAND SURGERY Right 2014   Dr Rudene Christians  . rotator cuff surgery Left 03/2013   DR Rudene Christians  . SHOULDER ARTHROSCOPY WITH OPEN ROTATOR CUFF REPAIR Right 04/25/2017   Procedure: SHOULDER ARTHROSCOPY WITH OPEN ROTATOR CUFF REPAIR;  Surgeon: Corky Mull, MD;  Location: [facility] ORS;  Service: Orthopedics;  Laterality: Right;  . SHOULDER ARTHROSCOPY WITH OPEN  ROTATOR CUFF REPAIR Right 03/11/2018   Procedure: SHOULDER ARTHROSCOPY WITH Recurrent  OPEN ROTATOR CUFF REPAIR;  Surgeon: Corky Mull, MD;  Location: [facility] ORS;  Service: Orthopedics;  Laterality: Right;  debridment and decompression       History of present illness and  Hospital Course:     Kindly see H&P for history of present illness and admission details, please review complete Labs, Consult reports and Test reports for all details in brief  HPI  from the history and physical done on the day of admission 73 year old female patient admitted for confusion, elevated blood sugars.  Initially admitted for possible sepsis.   Hospital Course  #1 sepsis present on admission of unclear origin with elevated lactic acid of 2, patient received aggressive antibiotics, received IV fluids, patient blood cultures have been negative chest x-ray did not show any pneumonia, patient UA did not show any evidence of infection on admission.  COVID-19 test has been negative.  Patient initial white count was elevated as high as 19.5 but decreased to 13.2.  Initial lactic acid 2.1 but procalcitonin is less than 0.1.  Patient feels much better, and has been afebrile, hemodynamically stable.  Patient received IV antibiotics from June 4 until today.  Does not need any further dose of antibiotics at discharge. 2.  Recent plantar fasciitis, patient received prednisone Dosepak at ER, patient leukocytosis likely coming from prednisone as well. 3.  Diabetes mellitus type 2, uncontrolled, patient hemoglobin A1c is 8, seen by diabetes coordinator, patient has poor understanding of the disease, he takes Lantus, Januvia at home, did on Lantus, NovoLog, Tradjenta here.  And prednisone can impact her sugar levels,  encouraged the patient to check blood glucose 2-3 times a day, will send her with home health nurse, patient is to follow-up at [facility] clinic in 1 week as post hospital discharge. #4 deconditioning, physical therapy  recommended home health physical therapy, 24-hour supervision, patient said that she lives with her grandson. 5.  Altered mental status likely secondary to hypoglycemia, patient is alert, awake, oriented now, sitting in the chair, denies any complaints.  #6/ dizziness, ultrasound of carotids did not show any hemodynamically significant stenosis, patient did not have any orthostatic vitals.  Discharge Condition: Stable   Follow UP  [place], [facility]. Schedule an appointment as soon as possible for a visit in 1 week(s).   Specialty:  General Practice Contact information: [place] [address] [phone]             Discharge Instructions  and  Discharge Medications      Allergies as of 08/11/2018      Reactions   Ace Inhibitors Other (See Comments), Cough   Constant persistent cough   Pollen Extracts [pollen Extract] Other (See Comments)   Sneezing/watery eyes/itchy eye   Vicodin [hydrocodone-acetaminophen] Nausea And Vomiting      Medication List    TAKE these medications   acetaminophen 500 MG tablet Commonly known as:  TYLENOL Take 500-1,000 mg by mouth every 6 (six) hours as needed for mild pain or headache.   aspirin EC 81 MG tablet Take 1 tablet (81 mg total) by mouth daily.   benzonatate 100 MG capsule Commonly known as:  Tessalon Perles Take 1 capsule (100 mg total) by mouth every 6 (six) hours as needed for cough.   blood glucose meter kit and supplies Kit Dispense based on patient and insurance preference. Use up to four times daily as directed. (FOR ICD-9 250.00, 250.01).   diclofenac sodium 1 % Gel Commonly known as:  VOLTAREN Apply 2 g topically 2 (two) times daily as needed. What changed:  reasons to take this   hydrochlorothiazide 25 MG tablet Commonly known as:  HYDRODIURIL Take 25 mg by mouth daily.   isosorbide mononitrate 30 MG 24 hr tablet Commonly known as:  IMDUR Take 30 mg  by mouth daily.   Lantus SoloStar 100 UNIT/ML Solostar Pen Generic drug:  Insulin Glargine Inject 43 Units into the skin daily.   loratadine 10 MG tablet Commonly known as:  CLARITIN Take 10 mg by mouth daily.   losartan 25 MG tablet Commonly known as:  COZAAR Take 25 mg by mouth daily.   multivitamin with minerals Tabs tablet Take 1 tablet by mouth daily.   nortriptyline 10 MG capsule Commonly known as:  PAMELOR Take 30 mg by mouth at bedtime.   omeprazole 20 MG capsule Commonly known as:  PRILOSEC Take 20 mg by mouth daily.   ondansetron 4 MG disintegrating tablet Commonly known as:  Zofran ODT Take 1 tablet (4 mg total) by mouth every 8 (eight) hours as needed for nausea or vomiting.   oxyCODONE 5 MG immediate release tablet Commonly known as:  Roxicodone Take 1-2 tablets (5-10 mg total) by mouth every 4 (four) hours as needed.   ProAir HFA 108 (90 Base) MCG/ACT inhaler Generic drug:  albuterol Inhale 2 puffs into the lungs every 6 (six) hours as needed for wheezing or shortness of breath.   sitaGLIPtin 100 MG tablet Commonly known as:  JANUVIA Take 100 mg by mouth daily.   SUMAtriptan 100  MG tablet Commonly known as:  IMITREX Take 100 mg by mouth every 2 (two) hours as needed for migraine. May repeat in 2 hours if headache persists or recurs.   venlafaxine XR 150 MG 24 hr capsule Commonly known as:  EFFEXOR-XR Take 150 mg by mouth daily with breakfast.   Vitamin D3 50 MCG (2000 UT) Tabs Take 2,000 Units by mouth daily.         Diet and Activity recommendation: See Discharge Instructions above   Consults obtained -physical therapy, diabetes coordinator   Major procedures and Radiology Reports - PLEASE review detailed and final reports for all details, in brief -     Ct Head Wo Contrast  Result Date: 08/07/2018 CLINICAL DATA:  73 year old female with altered mental status, headache, ataxia, hyperglycemia. EXAM: CT HEAD WITHOUT CONTRAST TECHNIQUE:  Contiguous axial images were obtained from the base of the skull through the vertex without intravenous contrast. COMPARISON:  Brain MRI and head CT 07/29/2018 and earlier. FINDINGS: Brain: Stable gray-white matter differentiation throughout the brain. No midline shift, ventriculomegaly, mass effect, evidence of mass lesion, intracranial hemorrhage or evidence of cortically based acute infarction. Mild white matter hypodensity. No cortical encephalomalacia identified. Vascular: Calcified atherosclerosis at the skull base. No suspicious intracranial vascular hyperdensity. Skull: Negative. Sinuses/Orbits: Visualized paranasal sinuses and mastoids are stable and well pneumatized. Other: Visualized orbits and scalp soft tissues are within normal limits. IMPRESSION: Stable mild white matter changes. No acute intracranial abnormality. Electronically Signed   By: Genevie Ann M.D.   On: 08/07/2018 18:09   Ct Head Wo Contrast  Result Date: 07/29/2018 CLINICAL DATA:  Headache, dizziness. EXAM: CT HEAD WITHOUT CONTRAST TECHNIQUE: Contiguous axial images were obtained from the base of the skull through the vertex without intravenous contrast. COMPARISON:  CT scan of January 05, 2015. FINDINGS: Brain: Mild chronic ischemic white matter disease is noted. No mass effect or midline shift is noted. Ventricular size is within normal limits. There is no evidence of mass lesion, hemorrhage or acute infarction. Vascular: No hyperdense vessel or unexpected calcification. Skull: Normal. Negative for fracture or focal lesion. Sinuses/Orbits: No acute finding. Other: None. IMPRESSION: Mild chronic ischemic white matter disease. No acute intracranial abnormality seen. Electronically Signed   By: Marijo Conception M.D.   On: 07/29/2018 16:01   Mr Brain Wo Contrast  Result Date: 07/29/2018 CLINICAL DATA:  Initial evaluation for acute encephalopathy, increased forgetfulness, headache, dizziness. EXAM: MRI HEAD WITHOUT CONTRAST TECHNIQUE:  Multiplanar, multiecho pulse sequences of the brain and surrounding structures were obtained without intravenous contrast. COMPARISON:  Prior CT from earlier same day. FINDINGS: Brain: Cerebral volume within normal limits for patient age. Scattered patchy T2/FLAIR hyperintensity within the periventricular and deep white matter both cerebral hemispheres most consistent with chronic small vessel ischemic disease, mild in nature. No abnormal foci of restricted diffusion to suggest acute or subacute ischemia. Gray-white matter differentiation well maintained. No encephalomalacia to suggest chronic infarction. No foci of susceptibility artifact to suggest acute or chronic intracranial hemorrhage. No mass lesion, midline shift or mass effect. No hydrocephalus. No extra-axial fluid collection. Major dural sinuses are grossly patent. Note made of a partially empty sella. Midline structures intact and normal. Vascular: Major intracranial vascular flow voids well maintained and normal in appearance. Skull and upper cervical spine: Craniocervical junction normal. Multilevel degenerative spondylolysis noted within the upper cervical spine without significant stenosis. Bone marrow signal intensity normal. No scalp soft tissue abnormality. Sinuses/Orbits: Globes and orbital soft tissues within normal limits. Paranasal  sinuses are clear. No mastoid effusion. Inner ear structures normal. Other: None. IMPRESSION: 1. No acute intracranial abnormality identified. No findings to explain patient's symptoms. 2. Generalized age appropriate cerebral volume loss with mild chronic small vessel ischemic disease. Electronically Signed   By: Jeannine Boga M.D.   On: 07/29/2018 19:41   US Carotid Bilateral  Result Date: 08/09/2018 CLINICAL DATA:  73 year old female with dizziness EXAM: BILATERAL CAROTID DUPLEX ULTRASOUND TECHNIQUE: Pearline Cables scale imaging, color Doppler and duplex ultrasound were performed of bilateral carotid and vertebral  arteries in the neck. COMPARISON:  None. FINDINGS: Criteria: Quantification of carotid stenosis is based on velocity parameters that correlate the residual internal carotid diameter with NASCET-based stenosis levels, using the diameter of the distal internal carotid lumen as the denominator for stenosis measurement. The following velocity measurements were obtained: RIGHT ICA: 38/15 cm/sec CCA: 32/67 cm/sec SYSTOLIC ICA/CCA RATIO:  0.8 ECA:  103 cm/sec LEFT ICA: 56/21 cm/sec CCA: 12/45 cm/sec SYSTOLIC ICA/CCA RATIO:  1.1 ECA:  57 cm/sec RIGHT CAROTID ARTERY: No significant atherosclerotic plaque or evidence of stenosis. RIGHT VERTEBRAL ARTERY:  Patent with normal antegrade flow. LEFT CAROTID ARTERY: No significant atherosclerotic plaque or evidence of stenosis. LEFT VERTEBRAL ARTERY:  Patent with normal antegrade flow. IMPRESSION: Negative bilateral carotid duplex ultrasound. Signed, Criselda Peaches, MD, [facility] Electronically Signed   By: Jacqulynn Cadet M.D.   On: 08/09/2018 13:45   US Carotid Bilateral  Result Date: 07/30/2018 CLINICAL DATA:  Headache.  Dizziness.  Increased forgetfulness. EXAM: BILATERAL CAROTID DUPLEX ULTRASOUND TECHNIQUE: Pearline Cables scale imaging, color Doppler and duplex ultrasound were performed of bilateral carotid and vertebral arteries in the neck. COMPARISON:  12/20/2009 FINDINGS: Criteria: Quantification of carotid stenosis is based on velocity parameters that correlate the residual internal carotid diameter with NASCET-based stenosis levels, using the diameter of the distal internal carotid lumen as the denominator for stenosis measurement. The following velocity measurements were obtained: RIGHT ICA: 65 cm/sec CCA: 50 cm/sec SYSTOLIC ICA/CCA RATIO:  1.3 ECA: 57 cm/sec LEFT ICA: 39 cm/sec CCA: 64 cm/sec SYSTOLIC ICA/CCA RATIO:  0.6 ECA: 55 cm/sec RIGHT CAROTID ARTERY: Little if any plaque in the bulb. Low  resistance internal carotid Doppler pattern is preserved. RIGHT VERTEBRAL ARTERY:  Antegrade. LEFT CAROTID ARTERY: Little if any plaque in the bulb. Low resistance internal carotid Doppler pattern is preserved. LEFT VERTEBRAL ARTERY:  Antegrade. IMPRESSION: Less than 50% stenosis in the right and left internal carotid arteries. Electronically Signed   By: Marybelle Killings M.D.   On: 07/30/2018 11:27   Dg Chest Portable 1 View  Result Date: 08/07/2018 CLINICAL DATA:  Altered mental status and hyperglycemia EXAM: PORTABLE CHEST 1 VIEW COMPARISON:  Jul 26, 2018 FINDINGS: There is bibasilar atelectasis. There is no edema or consolidation. Heart is upper normal in size with pulmonary vascularity normal. No adenopathy. There is degenerative change in the thoracic spine. IMPRESSION: Mild bibasilar atelectasis. No edema or consolidation. Stable cardiac silhouette. Electronically Signed   By: Lowella Grip III M.D.   On: 08/07/2018 12:46   Dg Chest Port 1 View  Result Date: 07/26/2018 CLINICAL DATA:  73 year old female with a history of cough and headache EXAM: PORTABLE CHEST 1 VIEW COMPARISON:  07/22/2018 FINDINGS: Cardiomediastinal silhouette unchanged in size and contour. No evidence of pneumothorax or pleural effusion. No confluent airspace disease. No displaced fracture. IMPRESSION: Negative for acute cardiopulmonary disease Electronically Signed   By: Corrie Mckusick D.O.   On: 07/26/2018 13:51  Dg Chest Portable 1 View  Result Date: 07/22/2018 CLINICAL DATA:  Shortness of breath EXAM: PORTABLE CHEST 1 VIEW COMPARISON:  Jul 29, 2016. FINDINGS: No edema or consolidation. There is slight left base atelectasis. Heart is upper normal in size with pulmonary vascularity normal. No adenopathy. There is degenerative change in the thoracic spine and in each shoulder. IMPRESSION: Slight left base atelectasis. No edema or consolidation. Heart upper normal in size. Electronically Signed   By: Lowella Grip III M.D.    On: 07/22/2018 22:00   Dg Foot Complete Right  Result Date: 08/02/2018 CLINICAL DATA:  Right foot pain EXAM: RIGHT FOOT COMPLETE - 3+ VIEW COMPARISON:  None. FINDINGS: Mild degenerative changes and hallux valgus deformity at the 1st MTP joint. No acute bony abnormality. Specifically, no fracture, subluxation, or dislocation. Plantar calcaneal spur. IMPRESSION: First MTP degenerative changes/hallux valgus deformity. Plantar calcaneal spur. No acute findings. Electronically Signed   By: Rolm Baptise M.D.   On: 08/02/2018 18:54    Micro Results     Recent Results (from the past 240 hour(s))  SARS Coronavirus 2 (CEPHEID - Performed in [place] hospital lab), Hosp Order     Status: None   Collection Time: 08/07/18  6:48 PM  Result Value Ref Range Status   SARS Coronavirus 2 NEGATIVE NEGATIVE Final    Comment: (NOTE) If result is NEGATIVE SARS-CoV-2 target nucleic acids are NOT DETECTED. The SARS-CoV-2 RNA is generally detectable in upper and lower  respiratory specimens during the acute phase of infection. The lowest  concentration of SARS-CoV-2 viral copies this assay can detect is 250  copies / mL. A negative result does not preclude SARS-CoV-2 infection  and should not be used as the sole basis for treatment or other  patient management decisions.  A negative result may occur with  improper specimen collection / handling, submission of specimen other  than nasopharyngeal swab, presence of viral mutation(s) within the  areas targeted by this assay, and inadequate number of viral copies  (<250 copies / mL). A negative result must be combined with clinical  observations, patient history, and epidemiological information. If result is POSITIVE SARS-CoV-2 target nucleic acids are DETECTED. The SARS-CoV-2 RNA is generally detectable in upper and lower  respiratory specimens dur ing the acute phase of infection.  Positive  results are indicative of active infection with SARS-CoV-2.   Clinical  correlation with patient history and other diagnostic information is  necessary to determine patient infection status.  Positive results do  not rule out bacterial infection or co-infection with other viruses. If result is PRESUMPTIVE POSTIVE SARS-CoV-2 nucleic acids MAY BE PRESENT.   A presumptive positive result was obtained on the submitted specimen  and confirmed on repeat testing.  While 2019 novel coronavirus  (SARS-CoV-2) nucleic acids may be present in the submitted sample  additional confirmatory testing may be necessary for epidemiological  and / or clinical management purposes  to differentiate between  SARS-CoV-2 and other Sarbecovirus currently known to infect humans.  If clinically indicated additional testing with an alternate test  methodology [phone]) is advised. The SARS-CoV-2 RNA is generally  detectable in upper and lower respiratory sp ecimens during the acute  phase of infection. The expected result is Negative. Fact Sheet for Patients:  StrictlyIdeas.no Fact Sheet for Healthcare Providers: BankingDealers.co.za This test is not yet approved or cleared by the [place] FDA and has been authorized for detection and/or diagnosis of SARS-CoV-2 by FDA under an Emergency Use Authorization (EUA).  This EUA will remain in effect (meaning this test can be used) for the duration of the COVID-19 declaration under Section 564(b)(1) of the Act, 21 U.S.C. section 360bbb-3(b)(1), unless the authorization is terminated or revoked sooner. Performed at [facility], [address] [place], [place] [address]   Blood culture (routine x 2)     Status: None (Preliminary result)   Collection Time: 08/07/18  8:58 PM  Result Value Ref Range Status   Specimen Description BLOOD BLOOD RIGHT HAND  Final   Special Requests   Final    BOTTLES DRAWN AEROBIC AND ANAEROBIC Blood Culture results may not be optimal due to  an excessive volume of blood received in culture bottles   Culture   Final    NO GROWTH 4 DAYS Performed at [facility], [address], [place], [place] [address]    Report Status PENDING  Incomplete  Blood culture (routine x 2)     Status: None (Preliminary result)   Collection Time: 08/07/18  8:59 PM  Result Value Ref Range Status   Specimen Description BLOOD BLOOD LEFT ARM  Final   Special Requests   Final    BOTTLES DRAWN AEROBIC AND ANAEROBIC Blood Culture adequate volume   Culture   Final    NO GROWTH 4 DAYS Performed at [facility], [address] [place], [place] [address]    Report Status PENDING  Incomplete  MRSA PCR Screening     Status: None   Collection Time: 08/08/18  8:15 AM  Result Value Ref Range Status   MRSA by PCR NEGATIVE NEGATIVE Final    Comment:        The GeneXpert MRSA Assay (FDA approved for NASAL specimens only), is one component of a comprehensive MRSA colonization surveillance program. It is not intended to diagnose MRSA infection nor to guide or monitor treatment for MRSA infections. Performed at [facility], [address] [place], [place] [address]        Today   Subjective:   Khori Underberg today has no headache,no chest abdominal pain,no new weakness tingling or numbness, feels much better wants to go home today.   Objective:   Blood pressure 137/83, pulse 69, temperature 97.8 F (36.6 C), temperature source Oral, resp. rate 20, height '5\' 1"'  (1.549 m), weight 77.1 kg, SpO2 97 %.   Intake/Output Summary (Last 24 hours) at 08/11/2018 1109 Last data filed at 08/11/2018 0900 Gross per 24 hour  Intake 1132.12 ml  Output 1100 ml  Net 32.12 ml    Exam Awake Alert, Oriented x 3, No new F.N deficits, Normal affect [place].AT,PERRAL Supple Neck,No JVD, No cervical lymphadenopathy appriciated.  Symmetrical Chest wall movement, Good air movement bilaterally, CTAB RRR,No Gallops,Rubs or new Murmurs, No  Parasternal Heave +ve B.Sounds, Abd Soft, Non tender, No organomegaly appriciated, No rebound -guarding or rigidity. No Cyanosis, Clubbing or edema, No new Rash or bruise  Data Review   CBC w Diff:  Lab Results  Component Value Date   WBC 13.2 (H) 08/11/2018   HGB 12.3 08/11/2018   HGB 10.5 (L) 07/10/2013   HCT 37.8 08/11/2018   HCT 32.1 (L) 07/10/2013   PLT 293 08/11/2018   PLT 303 07/10/2013   LYMPHOPCT 11 08/07/2018   LYMPHOPCT 17.7 07/10/2013   MONOPCT 6 08/07/2018   MONOPCT 10.0 07/10/2013   EOSPCT 0 08/07/2018   EOSPCT 0.8 07/10/2013   BASOPCT 0 08/07/2018   BASOPCT 1.1 07/10/2013    CMP:  Lab Results  Component Value Date   NA 137 08/08/2018   NA 139 07/09/2013   K 4.0 08/08/2018   K 3.3 (L) 07/09/2013   CL 104 08/08/2018   CL 106 07/09/2013   CO2 24 08/08/2018   CO2 27 07/09/2013   BUN 30 (H) 08/08/2018   BUN 27 (H) 07/09/2013   CREATININE 1.27 (H) 08/08/2018   CREATININE 1.60 (H) 07/10/2013   PROT 7.3 08/07/2018   PROT 7.9 07/07/2013   ALBUMIN 3.4 (L) 08/07/2018   ALBUMIN 3.5 07/07/2013   BILITOT 0.3 08/07/2018   BILITOT 0.6 07/07/2013   ALKPHOS 135 (H) 08/07/2018   ALKPHOS 55 07/07/2013   AST 21 08/07/2018   AST 23 07/07/2013   ALT 20 08/07/2018   ALT 16 07/07/2013  .   Total Time in preparing paper work, data evaluation and todays exam - 35 minutes  Epifanio Lesches M.D on 08/11/2018 at 11:09 AM    Note: This dictation was prepared with Dragon dictation along with smaller phrase technology. Any transcriptional errors that result from this process are unintentional.

## 2018-08-11 NOTE — TOC Transition Note (Signed)
Transition of Care [facility]) - CM/SW Discharge Note   Patient Details  Name: Theresa Booker MRN: [id number] Date of Birth: 06/27/1945  Transition [facility] Care [facility]) CM/SW Contact:  Shela Leff, LCSW Phone Number: 08/11/2018, 2:01 PM   Clinical Narrative:   Physician to discharge patient home today with home health. Patient stated she has used [facility] in the past and wants to use them again. When CSW contacted Corene Cornea with [facility], he informed CSW that patient was already a current patient of theirs.     Final next level of care: Home w Home Health Services Barriers to Discharge: No Barriers Identified   Patient Goals and CMS Choice   CMS Medicare.gov Compare Post Acute Care list provided to:: Patient Choice offered to / list presented to : Patient  Discharge Placement                       Discharge Plan and Services     Post Acute Care Choice: Resumption of Svcs/PTA Provider                    HH Arranged: RN, PT [facility] Agency: [place] ([facility]) Date HH Agency Contacted: 08/11/18 Time [place]: 1400 Representative spoke with at [place]: Corene Cornea)  Social Determinants of Health ([place]) Interventions     Readmission Risk Interventions Readmission Risk Prevention Plan 08/09/2018  Transportation Screening Complete  Medication Review Press photographer) Complete  HRI or Home Care Consult Complete  Some recent data might be hidden

## 2018-08-11 NOTE — Progress Notes (Signed)
Pt discharged today.  IV removed w/no issues. Pt understood her discharge paperwork.    Pt waiting for grandson to pick her up.

## 2018-08-12 LAB — CULTURE, BLOOD (ROUTINE X 2)
Culture: NO GROWTH
Culture: NO GROWTH
Special Requests: ADEQUATE

## 2018-08-12 LAB — URINE CULTURE
Culture: NO GROWTH
Special Requests: NORMAL

## 2018-08-22 ENCOUNTER — Emergency Department
Admission: EM | Admit: 2018-08-22 | Discharge: 2018-08-22 | Disposition: A | Discharge status: Home / Self Care | Payer: Medicare Other | Attending: Emergency Medicine | Admitting: Emergency Medicine

## 2018-08-22 ENCOUNTER — Encounter: Payer: Self-pay | Admitting: Emergency Medicine

## 2018-08-22 ENCOUNTER — Other Ambulatory Visit: Payer: Self-pay

## 2018-08-22 ENCOUNTER — Emergency Department: Payer: Medicare Other

## 2018-08-22 DIAGNOSIS — I129 Hypertensive chronic kidney disease with stage 1 through stage 4 chronic kidney disease, or unspecified chronic kidney disease: Secondary | ICD-10-CM | POA: Diagnosis not present

## 2018-08-22 DIAGNOSIS — S93402A Sprain of unspecified ligament of left ankle, initial encounter: Secondary | ICD-10-CM | POA: Diagnosis not present

## 2018-08-22 DIAGNOSIS — Y998 Other external cause status: Secondary | ICD-10-CM | POA: Insufficient documentation

## 2018-08-22 DIAGNOSIS — Y92009 Unspecified place in unspecified non-institutional (private) residence as the place of occurrence of the external cause: Secondary | ICD-10-CM | POA: Insufficient documentation

## 2018-08-22 DIAGNOSIS — W010XXA Fall on same level from slipping, tripping and stumbling without subsequent striking against object, initial encounter: Secondary | ICD-10-CM | POA: Diagnosis not present

## 2018-08-22 DIAGNOSIS — N183 Chronic kidney disease, stage 3 (moderate): Secondary | ICD-10-CM | POA: Insufficient documentation

## 2018-08-22 DIAGNOSIS — Z79899 Other long term (current) drug therapy: Secondary | ICD-10-CM | POA: Insufficient documentation

## 2018-08-22 DIAGNOSIS — W19XXXA Unspecified fall, initial encounter: Secondary | ICD-10-CM

## 2018-08-22 DIAGNOSIS — Y9301 Activity, walking, marching and hiking: Secondary | ICD-10-CM | POA: Diagnosis not present

## 2018-08-22 DIAGNOSIS — S99912A Unspecified injury of left ankle, initial encounter: Secondary | ICD-10-CM | POA: Diagnosis present

## 2018-08-22 MED ORDER — NAPROXEN 500 MG PO TABS: 500.0000 mg | ORAL_TABLET | Freq: Two times a day (BID) | ORAL | 2 refills | Status: DC

## 2018-08-22 NOTE — ED Provider Notes (Signed)
[facility] Emergency Department Provider Note   ____________________________________________    I have reviewed the triage vital signs and the nursing notes.   HISTORY  Chief Complaint Fall     HPI Theresa Booker is a 73 y.o. female presents with complaints of left ankle pain after a fall.  Patient reports 2 days ago she fell and since then has had pain in her left ankle.  She denies significant swelling.  She has not take anything for this.  No other injuries reported.  She reports it is painful when she bears weight on it however she is able to walk with a walker.  Past Medical History:  Diagnosis Date  . Asthma   . Atypical chest pain    neg ETT at [facility]  . CKD (chronic kidney disease), stage III ([place])   . Depression   . Diabetes mellitus without complication ([place])   . Fibromyalgia   . HLD (hyperlipidemia)   . Hypertension   . Vitamin D deficiency     Patient Active Problem List   Diagnosis Date Noted  . Sepsis ([place]) 08/07/2018  . AMS (altered mental status) 07/30/2018  . Altered mental status 07/29/2018  . Cocaine use 05/03/2015  . Abnormal MRI, lumbar spine 05/03/2015  . Numbness in feet 04/29/2015  . Gait instability 04/29/2015  . BP (high blood pressure) 04/13/2015  . Fibromyalgia 04/13/2015  . Essential (primary) hypertension 04/13/2015  . Clinical depression 04/13/2015  . Chronic kidney disease (CKD), stage III (moderate) ([place]) 04/13/2015  . Airway hyperreactivity 04/13/2015  . Chronic knee pain (Right) 04/13/2015  . Chronic low back pain (Location of Secondary source of pain) (Bilateral) (R>L) 04/13/2015  . Chronic lower straight pain (Location of Primary Source of Pain) (Right) 04/13/2015  . Chronic lumbar radicular pain (Right) (L5 Dermatome) 04/13/2015  . At high risk for falls 04/13/2015  . Chronic hip pain (Right) 04/13/2015  . Chronic sacroiliac joint pain (Bilateral) (R>L) 04/13/2015  . Controlled type 2 diabetes  mellitus without complication ([place]) 97/35/3299  . Headache disorder 02/01/2015  . Dizziness 02/01/2015  . Bad posture 02/01/2015  . Episodic tension type headache 01/10/2015  . Amnesia 10/18/2014  . 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 Acid reflux 09/24/2013  . Chronic pain 09/24/2013  . Avitaminosis D 02/11/2013    Past Surgical History:  Procedure Laterality Date  . ABDOMINAL HYSTERECTOMY    . COLONOSCOPY WITH PROPOFOL N/A 05/22/2016   Procedure: COLONOSCOPY WITH PROPOFOL;  Surgeon: Lollie Sails, MD;  Location: [facility] ENDOSCOPY;  Service: Endoscopy;  Laterality: N/A;  . ESOPHAGOGASTRODUODENOSCOPY (EGD) WITH PROPOFOL N/A 05/22/2016   Procedure: ESOPHAGOGASTRODUODENOSCOPY (EGD) WITH PROPOFOL;  Surgeon: Lollie Sails, MD;  Location: [facility] ENDOSCOPY;  Service: Endoscopy;  Laterality: N/A;  . HAND SURGERY Right 2014   Dr Rudene Christians  . rotator cuff surgery Left 03/2013   DR Rudene Christians  . SHOULDER ARTHROSCOPY WITH OPEN ROTATOR CUFF REPAIR Right 04/25/2017   Procedure: SHOULDER ARTHROSCOPY WITH OPEN ROTATOR CUFF REPAIR;  Surgeon: Corky Mull, MD;  Location: [facility] ORS;  Service: Orthopedics;  Laterality: Right;  . SHOULDER ARTHROSCOPY WITH OPEN ROTATOR CUFF REPAIR Right 03/11/2018   Procedure: SHOULDER ARTHROSCOPY WITH Recurrent  OPEN ROTATOR CUFF REPAIR;  Surgeon: Corky Mull, MD;  Location: [facility] ORS;  Service: Orthopedics;  Laterality: Right;  debridment and decompression    Prior to Admission medications   Medication Sig Start Date End Date Taking? Authorizing Provider  acetaminophen (TYLENOL) 500 MG  tablet Take 500-1,000 mg by mouth every 6 (six) hours as needed for mild pain or headache.     [provider]  albuterol (PROAIR HFA) 108 (90 BASE) MCG/ACT inhaler Inhale 2 puffs into the lungs every 6 (six) hours as needed for wheezing or shortness of breath.     [provider]  aspirin EC 81 MG tablet Take 1 tablet (81 mg total) by mouth daily.  07/30/18 07/30/19  Salary, Avel Peace, MD  benzonatate (TESSALON PERLES) 100 MG capsule Take 1 capsule (100 mg total) by mouth every 6 (six) hours as needed for cough. 07/22/18 07/22/19  Harvest Dark, MD  blood glucose meter kit and supplies KIT Dispense based on patient and insurance preference. Use up to four times daily as directed. (FOR ICD-9 250.00, 250.01). 08/11/18   Epifanio Lesches, MD  Cholecalciferol (VITAMIN D3) 50 MCG (2000 UT) TABS Take 2,000 Units by mouth daily.    [provider]  diclofenac sodium (VOLTAREN) 1 % GEL Apply 2 g topically 2 (two) times daily as needed. Patient taking differently: Apply 2 g topically 2 (two) times daily as needed (for pain).  09/30/17   Merlyn Lot, MD  hydrochlorothiazide (HYDRODIURIL) 25 MG tablet Take 25 mg by mouth daily.     [provider]  Insulin Glargine (LANTUS SOLOSTAR) 100 UNIT/ML Solostar Pen Inject 43 Units into the skin daily.    [provider]  isosorbide mononitrate (IMDUR) 30 MG 24 hr tablet Take 30 mg by mouth daily.    [provider]  loratadine (CLARITIN) 10 MG tablet Take 10 mg by mouth daily.    [provider]  losartan (COZAAR) 25 MG tablet Take 25 mg by mouth daily.    [provider]  Multiple Vitamin (MULTIVITAMIN WITH MINERALS) TABS tablet Take 1 tablet by mouth daily. 07/31/18   Salary, Avel Peace, MD  naproxen (NAPROSYN) 500 MG tablet Take 1 tablet (500 mg total) by mouth 2 (two) times daily with a meal. 08/22/18   Lavonia Drafts, MD  nortriptyline (PAMELOR) 10 MG capsule Take 30 mg by mouth at bedtime.     [provider]  omeprazole (PRILOSEC) 20 MG capsule Take 20 mg by mouth daily.     [provider]  ondansetron (ZOFRAN ODT) 4 MG disintegrating tablet Take 1 tablet (4 mg total) by mouth every 8 (eight) hours as needed for nausea or vomiting. 03/11/18   Poggi, Marshall Cork, MD  oxyCODONE (ROXICODONE) 5 MG immediate release tablet Take 1-2 tablets  (5-10 mg total) by mouth every 4 (four) hours as needed. 03/11/18   Poggi, Marshall Cork, MD  sitaGLIPtin (JANUVIA) 100 MG tablet Take 100 mg by mouth daily.    [provider]  SUMAtriptan (IMITREX) 100 MG tablet Take 100 mg by mouth every 2 (two) hours as needed for migraine. May repeat in 2 hours if headache persists or recurs.    [provider]  venlafaxine XR (EFFEXOR-XR) 150 MG 24 hr capsule Take 150 mg by mouth daily with breakfast.     [provider]     Allergies Ace inhibitors, Pollen extracts [pollen extract], and Vicodin [hydrocodone-acetaminophen]  Family History  Problem Relation Age of Onset  . Diabetes Mother   . Breast cancer Mother 57  . Cancer Sister   . Cancer Father   . Liver disease Brother   . Breast cancer Maternal Aunt   . Breast cancer Cousin        maternal  Social History Social History   Tobacco Use  . Smoking status: Never Smoker  . Smokeless tobacco: Never Used  Substance Use Topics  . Alcohol use: No    Alcohol/week: 0.0 standard drinks  . Drug use: No    Review of Systems  Constitutional: No fever/chills  ENT: No neck pain   Gastrointestinal: No abdominal pain.     Musculoskeletal: As above Skin: No laceration Neurological: Negative for numbness or tingling    ____________________________________________   PHYSICAL EXAM:  VITAL SIGNS: ED Triage Vitals  Enc Vitals Group     BP 08/22/18 1401 (!) 145/93     Pulse Rate 08/22/18 1401 87     Resp 08/22/18 1401 16     Temp 08/22/18 1401 99 F (37.2 C)     Temp Source 08/22/18 1401 Oral     SpO2 08/22/18 1401 94 %     Weight --      Height --      Head Circumference --      Peak Flow --      Pain Score 08/22/18 1358 10     Pain Loc --      Pain Edu? --      Excl. in [place]? --      Constitutional: Alert and oriented. No acute distress. Pleasant and interactive Eyes: Conjunctivae are normal.  Head: Atraumatic. Nose: No  congestion/rhinnorhea. Mouth/Throat: Mucous membranes are moist.   Cardiovascular: Normal rate, regular rhythm.  Respiratory: Normal respiratory effort.  No retractions.  Musculoskeletal: Left ankle: Minimal swelling, no bruising or bony abnormalities.  Well perfused.  No tenderness to palpation of the heel or forefoot.  Some pain with range of motion of the ankle. Neurologic:  Normal speech and language. No gross focal neurologic deficits are appreciated.   Skin:  Skin is warm, dry and intact. No rash noted.   ____________________________________________   LABS (all labs ordered are listed, but only abnormal results are displayed)  Labs Reviewed - No data to display ____________________________________________  EKG   ____________________________________________  RADIOLOGY  X-ray negative for fracture ____________________________________________   PROCEDURES  Procedure(s) performed: No  Procedures   Critical Care performed: No ____________________________________________   INITIAL IMPRESSION / ASSESSMENT AND PLAN / ED COURSE  Pertinent labs & imaging results that were available during my care of the patient were reviewed by me and considered in my medical decision making (see chart for details).  Overall reassuring exam, x-ray is reassuring.  Recommend Ace wrap, will provide a walker for the patient, rice, outpatient follow-up   ____________________________________________   FINAL CLINICAL IMPRESSION(S) / ED DIAGNOSES  Final diagnoses:  Fall, initial encounter  Sprain of left ankle, unspecified ligament, initial encounter      NEW MEDICATIONS STARTED DURING THIS VISIT:  Current Discharge Medication List    START taking these medications   Details  naproxen (NAPROSYN) 500 MG tablet Take 1 tablet (500 mg total) by mouth 2 (two) times daily with a meal. Qty: 20 tablet, Refills: 2         Note:  This document was prepared using Dragon voice  recognition software and may include unintentional dictation errors.   Lavonia Drafts, MD 08/22/18 1731

## 2018-08-22 NOTE — ED Notes (Signed)
Pt provided walker to take home per EDP request.

## 2018-08-22 NOTE — ED Notes (Signed)
Radiology to bedside. 

## 2018-08-22 NOTE — ED Triage Notes (Signed)
Family says she fell 2 days ago and injured ankle.  She says she fell due to dizziness and has been in the hospital a lot.

## 2018-08-22 NOTE — ED Triage Notes (Signed)
Pt to ED via POV c/o fall 2 days ago. Pt is c/o left ankle and foot pain. Pt is in NAD.

## 2018-09-11 ENCOUNTER — Emergency Department
Admission: EM | Admit: 2018-09-11 | Discharge: 2018-09-11 | Disposition: A | Discharge status: Home / Self Care | Payer: Medicare Other | Attending: Emergency Medicine | Admitting: Emergency Medicine

## 2018-09-11 ENCOUNTER — Other Ambulatory Visit: Payer: Self-pay

## 2018-09-11 ENCOUNTER — Emergency Department: Payer: Medicare Other

## 2018-09-11 DIAGNOSIS — K5900 Constipation, unspecified: Secondary | ICD-10-CM | POA: Diagnosis present

## 2018-09-11 DIAGNOSIS — I129 Hypertensive chronic kidney disease with stage 1 through stage 4 chronic kidney disease, or unspecified chronic kidney disease: Secondary | ICD-10-CM | POA: Diagnosis not present

## 2018-09-11 DIAGNOSIS — Z7982 Long term (current) use of aspirin: Secondary | ICD-10-CM | POA: Insufficient documentation

## 2018-09-11 DIAGNOSIS — N183 Chronic kidney disease, stage 3 (moderate): Secondary | ICD-10-CM | POA: Diagnosis not present

## 2018-09-11 DIAGNOSIS — Z794 Long term (current) use of insulin: Secondary | ICD-10-CM | POA: Insufficient documentation

## 2018-09-11 DIAGNOSIS — J45909 Unspecified asthma, uncomplicated: Secondary | ICD-10-CM | POA: Insufficient documentation

## 2018-09-11 DIAGNOSIS — Z79899 Other long term (current) drug therapy: Secondary | ICD-10-CM | POA: Insufficient documentation

## 2018-09-11 DIAGNOSIS — E1122 Type 2 diabetes mellitus with diabetic chronic kidney disease: Secondary | ICD-10-CM | POA: Insufficient documentation

## 2018-09-11 MED ORDER — LACTULOSE 10 GM/15ML PO SOLN
20.0000 g | Freq: Once | ORAL | Status: AC
  Administered 2018-09-11: 20 g via ORAL
  Filled 2018-09-11 (×2): qty 30

## 2018-09-11 MED ORDER — DOCUSATE SODIUM 50 MG/5ML PO LIQD
100.0000 mg | Freq: Once | ORAL | Status: AC
  Administered 2018-09-11: 23:00:00 100 mg via ORAL
  Filled 2018-09-11 (×2): qty 10

## 2018-09-11 MED ORDER — MINERAL OIL RE ENEM
1.0000 | ENEMA | Freq: Once | RECTAL | Status: AC
  Administered 2018-09-11: 1 via RECTAL

## 2018-09-11 MED ORDER — POLYETHYLENE GLYCOL 3350 17 G PO PACK: 17.0000 g | PACK | Freq: Every day | ORAL | 0 refills | Status: DC | PRN

## 2018-09-11 MED ORDER — MAGNESIUM CITRATE PO SOLN
1.0000 | Freq: Once | ORAL | Status: AC
  Administered 2018-09-11: 1 via ORAL
  Filled 2018-09-11 (×2): qty 296

## 2018-09-11 NOTE — ED Triage Notes (Signed)
Pt to the er for constipation. Last BM 3 days ago was very hard and pt had to disimpact herself. Pt is not treating herself at home with any meds or extra fiber.

## 2018-09-11 NOTE — ED Provider Notes (Signed)
[facility] Emergency Department Provider Note   First MD Initiated Contact with Patient 09/11/18 2122     (approximate)  I have reviewed the triage vital signs and the nursing notes.   HISTORY  Chief Complaint Constipation   HPI Theresa Booker is a 73 y.o. female with below list of previous medical conditions presents to the emergency department with a 3-day history of constipation.  Patient denies any abdominal pain no fever.  Patient denies any nausea or vomiting.  Patient denies any urinary symptoms.  Patient does admit to previous history of constipation.  Patient states that she is tried to disimpact himself at home however symptoms have been persistent over the past 3 days.        Past Medical History:  Diagnosis Date  . Asthma   . Atypical chest pain    neg ETT at [facility]  . CKD (chronic kidney disease), stage III ([place])   . Depression   . Diabetes mellitus without complication ([place])   . Fibromyalgia   . HLD (hyperlipidemia)   . Hypertension   . Vitamin D deficiency     Patient Active Problem List   Diagnosis Date Noted  . Sepsis ([place]) 08/07/2018  . AMS (altered mental status) 07/30/2018  . Altered mental status 07/29/2018  . Cocaine use 05/03/2015  . Abnormal MRI, lumbar spine 05/03/2015  . Numbness in feet 04/29/2015  . Gait instability 04/29/2015  . BP (high blood pressure) 04/13/2015  . Fibromyalgia 04/13/2015  . Essential (primary) hypertension 04/13/2015  . Clinical depression 04/13/2015  . Chronic kidney disease (CKD), stage III (moderate) ([place]) 04/13/2015  . Airway hyperreactivity 04/13/2015  . Chronic knee pain (Right) 04/13/2015  . Chronic low back pain (Location of Secondary source of pain) (Bilateral) (R>L) 04/13/2015  . Chronic lower straight pain (Location of Primary Source of Pain) (Right) 04/13/2015  . Chronic lumbar radicular pain (Right) (L5 Dermatome) 04/13/2015  . At high risk for falls 04/13/2015  . Chronic hip  pain (Right) 04/13/2015  . Chronic sacroiliac joint pain (Bilateral) (R>L) 04/13/2015  . Controlled type 2 diabetes mellitus without complication ([place]) 40/98/1191  . Headache disorder 02/01/2015  . Dizziness 02/01/2015  . Bad posture 02/01/2015  . Episodic tension type headache 01/10/2015  . Amnesia 10/18/2014  . 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 Acid reflux 09/24/2013  . Chronic pain 09/24/2013  . Avitaminosis D 02/11/2013    Past Surgical History:  Procedure Laterality Date  . ABDOMINAL HYSTERECTOMY    . COLONOSCOPY WITH PROPOFOL N/A 05/22/2016   Procedure: COLONOSCOPY WITH PROPOFOL;  Surgeon: Lollie Sails, MD;  Location: [facility] ENDOSCOPY;  Service: Endoscopy;  Laterality: N/A;  . ESOPHAGOGASTRODUODENOSCOPY (EGD) WITH PROPOFOL N/A 05/22/2016   Procedure: ESOPHAGOGASTRODUODENOSCOPY (EGD) WITH PROPOFOL;  Surgeon: Lollie Sails, MD;  Location: [facility] ENDOSCOPY;  Service: Endoscopy;  Laterality: N/A;  . HAND SURGERY Right 2014   Dr Rudene Christians  . rotator cuff surgery Left 03/2013   DR Rudene Christians  . SHOULDER ARTHROSCOPY WITH OPEN ROTATOR CUFF REPAIR Right 04/25/2017   Procedure: SHOULDER ARTHROSCOPY WITH OPEN ROTATOR CUFF REPAIR;  Surgeon: Corky Mull, MD;  Location: [facility] ORS;  Service: Orthopedics;  Laterality: Right;  . SHOULDER ARTHROSCOPY WITH OPEN ROTATOR CUFF REPAIR Right 03/11/2018   Procedure: SHOULDER ARTHROSCOPY WITH Recurrent  OPEN ROTATOR CUFF REPAIR;  Surgeon: Corky Mull, MD;  Location: [facility] ORS;  Service: Orthopedics;  Laterality: Right;  debridment and decompression    Prior to Admission medications  Medication Sig Start Date End Date Taking? Authorizing Provider  acetaminophen (TYLENOL) 500 MG tablet Take 500-1,000 mg by mouth every 6 (six) hours as needed for mild pain or headache.     [provider]  albuterol (PROAIR HFA) 108 (90 BASE) MCG/ACT inhaler Inhale 2 puffs into the lungs every 6 (six) hours as needed for wheezing or  shortness of breath.     [provider]  aspirin EC 81 MG tablet Take 1 tablet (81 mg total) by mouth daily. 07/30/18 07/30/19  Salary, Avel Peace, MD  benzonatate (TESSALON PERLES) 100 MG capsule Take 1 capsule (100 mg total) by mouth every 6 (six) hours as needed for cough. 07/22/18 07/22/19  Harvest Dark, MD  blood glucose meter kit and supplies KIT Dispense based on patient and insurance preference. Use up to four times daily as directed. (FOR ICD-9 250.00, 250.01). 08/11/18   Epifanio Lesches, MD  Cholecalciferol (VITAMIN D3) 50 MCG (2000 UT) TABS Take 2,000 Units by mouth daily.    [provider]  diclofenac sodium (VOLTAREN) 1 % GEL Apply 2 g topically 2 (two) times daily as needed. Patient taking differently: Apply 2 g topically 2 (two) times daily as needed (for pain).  09/30/17   Merlyn Lot, MD  hydrochlorothiazide (HYDRODIURIL) 25 MG tablet Take 25 mg by mouth daily.     [provider]  Insulin Glargine (LANTUS SOLOSTAR) 100 UNIT/ML Solostar Pen Inject 43 Units into the skin daily.    [provider]  isosorbide mononitrate (IMDUR) 30 MG 24 hr tablet Take 30 mg by mouth daily.    [provider]  loratadine (CLARITIN) 10 MG tablet Take 10 mg by mouth daily.    [provider]  losartan (COZAAR) 25 MG tablet Take 25 mg by mouth daily.    [provider]  Multiple Vitamin (MULTIVITAMIN WITH MINERALS) TABS tablet Take 1 tablet by mouth daily. 07/31/18   Salary, Avel Peace, MD  naproxen (NAPROSYN) 500 MG tablet Take 1 tablet (500 mg total) by mouth 2 (two) times daily with a meal. 08/22/18   Lavonia Drafts, MD  nortriptyline (PAMELOR) 10 MG capsule Take 30 mg by mouth at bedtime.     [provider]  omeprazole (PRILOSEC) 20 MG capsule Take 20 mg by mouth daily.     [provider]  ondansetron (ZOFRAN ODT) 4 MG disintegrating tablet Take 1 tablet (4 mg total) by mouth every 8 (eight) hours as needed for  nausea or vomiting. 03/11/18   Poggi, Marshall Cork, MD  oxyCODONE (ROXICODONE) 5 MG immediate release tablet Take 1-2 tablets (5-10 mg total) by mouth every 4 (four) hours as needed. 03/11/18   Poggi, Marshall Cork, MD  sitaGLIPtin (JANUVIA) 100 MG tablet Take 100 mg by mouth daily.    [provider]  SUMAtriptan (IMITREX) 100 MG tablet Take 100 mg by mouth every 2 (two) hours as needed for migraine. May repeat in 2 hours if headache persists or recurs.    [provider]  venlafaxine XR (EFFEXOR-XR) 150 MG 24 hr capsule Take 150 mg by mouth daily with breakfast.     [provider]    Allergies Ace inhibitors, Pollen extracts [pollen extract], and Vicodin [hydrocodone-acetaminophen]  Family History  Problem Relation Age of Onset  . Diabetes Mother   . Breast cancer Mother 30  . Cancer Sister   . Cancer Father   . Liver disease Brother   . Breast cancer Maternal Aunt   .  Breast cancer Cousin        maternal    Social History Social History   Tobacco Use  . Smoking status: Never Smoker  . Smokeless tobacco: Never Used  Substance Use Topics  . Alcohol use: No    Alcohol/week: 0.0 standard drinks  . Drug use: No    Review of Systems Constitutional: No fever/chills Eyes: No visual changes. ENT: No sore throat. Cardiovascular: Denies chest pain. Respiratory: Denies shortness of breath. Gastrointestinal: No abdominal pain.  No nausea, no vomiting.  No diarrhea.  Positive for constipation Genitourinary: Negative for dysuria. Musculoskeletal: Negative for neck pain.  Negative for back pain. Integumentary: Negative for rash. Neurological: Negative for headaches, focal weakness or numbness.  ____________________________________________   PHYSICAL EXAM:  VITAL SIGNS: ED Triage Vitals  Enc Vitals Group     BP 09/11/18 2052 114/74     Pulse Rate 09/11/18 2052 94     Resp 09/11/18 2052 18     Temp 09/11/18 2052 98.7 F (37.1 C)     Temp Source 09/11/18 2052  Oral     SpO2 09/11/18 2052 95 %     Weight 09/11/18 2054 74.8 kg (165 lb)     Height 09/11/18 2054 1.549 m ('5\' 1"' )     Head Circumference --      Peak Flow --      Pain Score 09/11/18 2053 5     Pain Loc --      Pain Edu? --      Excl. in [place]? --     Constitutional: Alert and oriented. Well appearing and in no acute distress. Eyes: Conjunctivae are normal. Mouth/Throat: Mucous membranes are moist.  Oropharynx non-erythematous. Neck: No stridor.   Cardiovascular: Normal rate, regular rhythm. Good peripheral circulation. Grossly normal heart sounds. Respiratory: Normal respiratory effort.  No retractions. No audible wheezing. Gastrointestinal: Soft and nontender. No distention.  Musculoskeletal: No lower extremity tenderness nor edema. No gross deformities of extremities. Neurologic:  Normal speech and language. No gross focal neurologic deficits are appreciated.  Skin:  Skin is warm, dry and intact. No rash noted. Psychiatric: Mood and affect are normal. Speech and behavior are normal.   RADIOLOGY I, [place], personally viewed and evaluated these images (plain radiographs) as part of my medical decision making, as well as reviewing the written report by the radiologist.  ED MD interpretation: Large volume of stool throughout the entire colon consistent with constipation.  Official radiology report(s): Dg Abdomen 1 View  Result Date: 09/11/2018 CLINICAL DATA:  Constipation EXAM: ABDOMEN - 1 VIEW COMPARISON:  CT abdomen dated 12/02/2014. FINDINGS: Overall bowel gas pattern appears nonobstructive. Fairly large amount of stool throughout the colon and rectal vault, compatible with the given history of constipation. No evidence of soft tissue mass or abnormal fluid collection. No evidence of free intraperitoneal air. No acute appearing osseous abnormality. Again noted is chronic degenerative spondylosis of the slightly scoliotic thoracolumbar spine. IMPRESSION: 1. Fairly large  amount of stool throughout the colon and rectal vault, compatible with the given history of constipation. 2. Nonobstructive bowel gas pattern. Electronically Signed   By: Franki Cabot M.D.   On: 09/11/2018 21:38      Procedures   ____________________________________________   INITIAL IMPRESSION / MDM / ASSESSMENT AND PLAN / ED COURSE  As part of my medical decision making, I reviewed the following data within the [place] NUMBER 73 year old female presenting with above-stated history and physical exam consistent with acute constipation.  Single view abdominal x-ray performed consistent with constipation.  Patient was given lactulose.  Patient had a large volume bowel movement and states that she "feels much better at this time".  Will be discharged home with MiraLAX.     ____________________________________________  FINAL CLINICAL IMPRESSION(S) / ED DIAGNOSES  Final diagnoses:  Constipation, unspecified constipation type     MEDICATIONS GIVEN DURING THIS VISIT:  Medications  magnesium citrate solution 1 Bottle (has no administration in time range)  docusate (COLACE) 50 MG/5ML liquid 100 mg (has no administration in time range)  mineral oil enema 1 enema (has no administration in time range)  lactulose (CHRONULAC) 10 GM/15ML solution 20 g (has no administration in time range)     ED Discharge Orders    None      *Please note:  Theresa Booker was evaluated in Emergency Department on 09/11/2018 for the symptoms described in the history of present illness. She was evaluated in the context of the global COVID-19 pandemic, which necessitated consideration that the patient might be at risk for infection with the SARS-CoV-2 virus that causes COVID-19. Institutional protocols and algorithms that pertain to the evaluation of patients at risk for COVID-19 are in a state of rapid change based on information released by regulatory bodies including the CDC and federal and  state organizations. These policies and algorithms were followed during the patient's care in the ED.  Some ED evaluations and interventions may be delayed as a result of limited staffing during the pandemic.*  Note:  This document was prepared using Dragon voice recognition software and may include unintentional dictation errors.   Gregor Hams, MD 09/11/18 [id number]

## 2019-03-03 ENCOUNTER — Emergency Department: Payer: Medicare Other

## 2019-03-03 ENCOUNTER — Other Ambulatory Visit: Payer: Self-pay

## 2019-03-03 ENCOUNTER — Emergency Department
Admission: EM | Admit: 2019-03-03 | Discharge: 2019-03-03 | Disposition: A | Discharge status: Home / Self Care | Payer: Medicare Other | Attending: Emergency Medicine | Admitting: Emergency Medicine

## 2019-03-03 ENCOUNTER — Encounter: Payer: Self-pay | Admitting: Emergency Medicine

## 2019-03-03 DIAGNOSIS — Y9389 Activity, other specified: Secondary | ICD-10-CM | POA: Diagnosis not present

## 2019-03-03 DIAGNOSIS — Z5321 Procedure and treatment not carried out due to patient leaving prior to being seen by health care provider: Secondary | ICD-10-CM | POA: Insufficient documentation

## 2019-03-03 DIAGNOSIS — Y999 Unspecified external cause status: Secondary | ICD-10-CM | POA: Insufficient documentation

## 2019-03-03 DIAGNOSIS — M25571 Pain in right ankle and joints of right foot: Secondary | ICD-10-CM | POA: Insufficient documentation

## 2019-03-03 DIAGNOSIS — W06XXXA Fall from bed, initial encounter: Secondary | ICD-10-CM | POA: Diagnosis not present

## 2019-03-03 DIAGNOSIS — M79641 Pain in right hand: Secondary | ICD-10-CM | POA: Diagnosis not present

## 2019-03-03 DIAGNOSIS — Y92003 Bedroom of unspecified non-institutional (private) residence as the place of occurrence of the external cause: Secondary | ICD-10-CM | POA: Insufficient documentation

## 2019-03-03 LAB — TROPONIN I (HIGH SENSITIVITY): Troponin I (High Sensitivity): 8 ng/L (ref <18)

## 2019-03-03 LAB — CBC
HCT: 39.4 % (ref 36.0–46.0)
Hemoglobin: 12.8 g/dL (ref 12.0–15.0)
MCH: 28.6 pg (ref 26.0–34.0)
MCHC: 32.5 g/dL (ref 30.0–36.0)
MCV: 88.1 fL (ref 80.0–100.0)
Platelets: 312 10*3/uL (ref 150–400)
RBC: 4.47 MIL/uL (ref 3.87–5.11)
RDW: 13.6 % (ref 11.5–15.5)
WBC: 8.9 10*3/uL (ref 4.0–10.5)
nRBC: 0 % (ref 0.0–0.2)

## 2019-03-03 LAB — BASIC METABOLIC PANEL
Anion gap: 9 (ref 5–15)
BUN: 22 mg/dL (ref 8–23)
CO2: 28 mmol/L (ref 22–32)
Calcium: 9.1 mg/dL (ref 8.9–10.3)
Chloride: 100 mmol/L (ref 98–111)
Creatinine, Ser: 1.35 mg/dL — ABNORMAL HIGH (ref 0.44–1.00)
GFR calc Af Amer: 44 mL/min — ABNORMAL LOW (ref >60)
GFR calc non Af Amer: 38 mL/min — ABNORMAL LOW (ref >60)
Glucose, Bld: 176 mg/dL — ABNORMAL HIGH (ref 70–99)
Potassium: 4.3 mmol/L (ref 3.5–5.1)
Sodium: 137 mmol/L (ref 135–145)

## 2019-03-03 MED ORDER — SODIUM CHLORIDE 0.9% FLUSH: 3.0000 mL | Freq: Once | INTRAVENOUS | Status: DC

## 2019-03-03 NOTE — ED Triage Notes (Signed)
Pt states she fell getting out of the bed on Saturday morning, landed on her right hand, hand swollen, painful, unable to bend fifth digit on right had. Missed her footing again today hit her right ankle on her dresser, c/o of ankle pain as well. Falling due to balance issues-states this is a recurrant problem, friend said she was slurring her words 2 days ago, speech clear at this time. No other stroke symptoms currently. Pt states possible cva in the past. NAD.

## 2019-03-03 NOTE — ED Notes (Signed)
No answer when called several times from lobby 

## 2019-03-03 NOTE — ED Notes (Addendum)
No answer when called several times from lobby; attempted to call pt's mobile phone with no answer

## 2019-03-06 ENCOUNTER — Telehealth: Payer: Self-pay | Admitting: Emergency Medicine

## 2019-03-06 NOTE — Telephone Encounter (Signed)
Called patient due to lwot to inquire about condition and follow up plans.  Said her hand/finger is swollen and she cant use it.  I told her that it could be broken, but she would need exam and possible xray to see.  I told her that she really needs to be seen for the dizziness that is causing her to fall.  . I told her that there are long waits here, but she should be seen.  Also told her urgent care was an option.  i told her that she definitely needs to tell her doctor what is going on.  She says she may return here.

## 2019-05-27 ENCOUNTER — Emergency Department: Payer: Medicare Other

## 2019-05-27 ENCOUNTER — Other Ambulatory Visit: Payer: Self-pay

## 2019-05-27 ENCOUNTER — Encounter: Payer: Self-pay | Admitting: Emergency Medicine

## 2019-05-27 ENCOUNTER — Emergency Department
Admission: EM | Admit: 2019-05-27 | Discharge: 2019-05-27 | Disposition: A | Discharge status: Home / Self Care | Payer: Medicare Other | Attending: Emergency Medicine | Admitting: Emergency Medicine

## 2019-05-27 DIAGNOSIS — R42 Dizziness and giddiness: Secondary | ICD-10-CM | POA: Diagnosis present

## 2019-05-27 DIAGNOSIS — Z5321 Procedure and treatment not carried out due to patient leaving prior to being seen by health care provider: Secondary | ICD-10-CM | POA: Insufficient documentation

## 2019-05-27 DIAGNOSIS — R531 Weakness: Secondary | ICD-10-CM | POA: Insufficient documentation

## 2019-05-27 LAB — CBC
HCT: 38.9 % (ref 36.0–46.0)
Hemoglobin: 12.7 g/dL (ref 12.0–15.0)
MCH: 29.3 pg (ref 26.0–34.0)
MCHC: 32.6 g/dL (ref 30.0–36.0)
MCV: 89.8 fL (ref 80.0–100.0)
Platelets: 265 10*3/uL (ref 150–400)
RBC: 4.33 MIL/uL (ref 3.87–5.11)
RDW: 13.5 % (ref 11.5–15.5)
WBC: 6.7 10*3/uL (ref 4.0–10.5)
nRBC: 0 % (ref 0.0–0.2)

## 2019-05-27 LAB — URINALYSIS, COMPLETE (UACMP) WITH MICROSCOPIC
Bilirubin Urine: NEGATIVE
Glucose, UA: NEGATIVE mg/dL
Hgb urine dipstick: NEGATIVE
Ketones, ur: NEGATIVE mg/dL
Nitrite: NEGATIVE
Protein, ur: >=300 mg/dL — AB
Specific Gravity, Urine: 1.015 (ref 1.005–1.030)
pH: 5 (ref 5.0–8.0)

## 2019-05-27 LAB — BASIC METABOLIC PANEL
Anion gap: 7 (ref 5–15)
BUN: 24 mg/dL — ABNORMAL HIGH (ref 8–23)
CO2: 28 mmol/L (ref 22–32)
Calcium: 9.6 mg/dL (ref 8.9–10.3)
Chloride: 102 mmol/L (ref 98–111)
Creatinine, Ser: 1.46 mg/dL — ABNORMAL HIGH (ref 0.44–1.00)
GFR calc Af Amer: 40 mL/min — ABNORMAL LOW (ref >60)
GFR calc non Af Amer: 35 mL/min — ABNORMAL LOW (ref >60)
Glucose, Bld: 113 mg/dL — ABNORMAL HIGH (ref 70–99)
Potassium: 4.6 mmol/L (ref 3.5–5.1)
Sodium: 137 mmol/L (ref 135–145)

## 2019-05-27 MED ORDER — SODIUM CHLORIDE 0.9% FLUSH: 3.0000 mL | Freq: Once | INTRAVENOUS | Status: DC

## 2019-05-27 NOTE — ED Triage Notes (Signed)
Pt presents to ED via POV with c/o weakness, dizziness, and generalized body aches. Pt also noted with cough in triage. Pt presents alert and oriented in triage.

## 2019-06-05 DIAGNOSIS — S065XAA Traumatic subdural hemorrhage with loss of consciousness status unknown, initial encounter: Secondary | ICD-10-CM | POA: Insufficient documentation

## 2019-06-05 DIAGNOSIS — S065X9A Traumatic subdural hemorrhage with loss of consciousness of unspecified duration, initial encounter: Secondary | ICD-10-CM | POA: Insufficient documentation

## 2019-06-08 MED ORDER — CHOLECALCIFEROL 25 MCG (1000 UT) PO TABS
50.00 | ORAL_TABLET | ORAL | Status: DC
Start: 2019-06-09 — End: 2019-06-08

## 2019-06-08 MED ORDER — ISOSORBIDE MONONITRATE ER 30 MG PO TB24
30.00 | ORAL_TABLET | ORAL | Status: DC
Start: 2019-06-09 — End: 2019-06-08

## 2019-06-08 MED ORDER — LORATADINE 10 MG PO TABS
10.00 | ORAL_TABLET | ORAL | Status: DC
Start: 2019-06-09 — End: 2019-06-08

## 2019-06-08 MED ORDER — CALCIUM CARBONATE 1250 (500 CA) MG PO CHEW
CHEWABLE_TABLET | ORAL | Status: DC
Start: ? — End: 2019-06-08

## 2019-06-08 MED ORDER — VENLAFAXINE HCL ER 37.5 MG PO CP24
150.00 | ORAL_CAPSULE | ORAL | Status: DC
Start: 2019-06-09 — End: 2019-06-08

## 2019-06-08 MED ORDER — ASPIRIN 81 MG PO CHEW
81.00 | CHEWABLE_TABLET | ORAL | Status: DC
Start: 2019-06-09 — End: 2019-06-08

## 2019-06-08 MED ORDER — ALBUTEROL SULFATE HFA 108 (90 BASE) MCG/ACT IN AERS
1.00 | INHALATION_SPRAY | RESPIRATORY_TRACT | Status: DC
Start: ? — End: 2019-06-08

## 2019-06-08 MED ORDER — INFLUENZA VAC SPLIT QUAD 0.5 ML IM SUSY
0.50 | PREFILLED_SYRINGE | INTRAMUSCULAR | Status: DC
Start: ? — End: 2019-06-08

## 2019-06-08 MED ORDER — INSULIN LISPRO 100 UNIT/ML ~~LOC~~ SOLN
0.00 | SUBCUTANEOUS | Status: DC
Start: 2019-06-08 — End: 2019-06-08

## 2019-06-08 MED ORDER — PANTOPRAZOLE SODIUM 20 MG PO TBEC
20.00 | DELAYED_RELEASE_TABLET | ORAL | Status: DC
Start: 2019-06-09 — End: 2019-06-08

## 2019-06-08 MED ORDER — DONEPEZIL HCL 10 MG PO TABS
10.00 | ORAL_TABLET | ORAL | Status: DC
Start: 2019-06-09 — End: 2019-06-08

## 2019-06-08 MED ORDER — NORTRIPTYLINE HCL 25 MG PO CAPS
25.00 | ORAL_CAPSULE | ORAL | Status: DC
Start: 2019-06-08 — End: 2019-06-08

## 2019-06-08 MED ORDER — GABAPENTIN 300 MG PO CAPS
300.00 | ORAL_CAPSULE | ORAL | Status: DC
Start: 2019-06-08 — End: 2019-06-08

## 2019-06-08 MED ORDER — FLUTICASONE FUROATE-VILANTEROL 100-25 MCG/INH IN AEPB
1.00 | INHALATION_SPRAY | RESPIRATORY_TRACT | Status: DC
Start: 2019-06-09 — End: 2019-06-08

## 2019-06-08 MED ORDER — HEPARIN SODIUM (PORCINE) 5000 UNIT/ML IJ SOLN
5000.00 | INTRAMUSCULAR | Status: DC
Start: 2019-06-08 — End: 2019-06-08

## 2019-06-08 MED ORDER — DICLOFENAC SODIUM 1 % EX GEL
2.00 | CUTANEOUS | Status: DC
Start: ? — End: 2019-06-08

## 2019-06-08 MED ORDER — PRAVASTATIN SODIUM 40 MG PO TABS
80.00 | ORAL_TABLET | ORAL | Status: DC
Start: 2019-06-08 — End: 2019-06-08

## 2019-06-08 MED ORDER — BUTALBITAL-APAP-CAFFEINE 50-325-40 MG PO TABS
1.00 | ORAL_TABLET | ORAL | Status: DC
Start: ? — End: 2019-06-08

## 2019-06-08 MED ORDER — DEXTROSE 50 % IV SOLN
12.50 | INTRAVENOUS | Status: DC
Start: ? — End: 2019-06-08

## 2019-06-08 MED ORDER — POLYETHYLENE GLYCOL 3350 17 GM/SCOOP PO POWD
17.00 | ORAL | Status: DC
Start: ? — End: 2019-06-08

## 2019-06-08 MED ORDER — ACETAMINOPHEN 325 MG PO TABS
650.00 | ORAL_TABLET | ORAL | Status: DC
Start: ? — End: 2019-06-08

## 2019-06-08 MED ORDER — GLUCOSE 40 % PO GEL
ORAL | Status: DC
Start: ? — End: 2019-06-08

## 2020-06-09 ENCOUNTER — Ambulatory Visit: Payer: Self-pay | Admitting: Podiatry

## 2020-06-09 DIAGNOSIS — E119 Type 2 diabetes mellitus without complications: Secondary | ICD-10-CM | POA: Insufficient documentation

## 2020-06-14 ENCOUNTER — Other Ambulatory Visit: Payer: Self-pay | Admitting: Family Medicine

## 2020-06-14 DIAGNOSIS — Z1231 Encounter for screening mammogram for malignant neoplasm of breast: Secondary | ICD-10-CM

## 2020-06-21 ENCOUNTER — Ambulatory Visit: Payer: Self-pay | Admitting: Podiatry

## 2021-02-14 ENCOUNTER — Emergency Department: Payer: Medicare Other

## 2021-02-14 ENCOUNTER — Other Ambulatory Visit: Payer: Self-pay

## 2021-02-14 ENCOUNTER — Encounter: Payer: Self-pay | Admitting: Emergency Medicine

## 2021-02-14 DIAGNOSIS — E785 Hyperlipidemia, unspecified: Secondary | ICD-10-CM | POA: Diagnosis present

## 2021-02-14 DIAGNOSIS — N1832 Chronic kidney disease, stage 3b: Secondary | ICD-10-CM | POA: Diagnosis present

## 2021-02-14 DIAGNOSIS — J45909 Unspecified asthma, uncomplicated: Secondary | ICD-10-CM | POA: Diagnosis present

## 2021-02-14 DIAGNOSIS — Z91048 Other nonmedicinal substance allergy status: Secondary | ICD-10-CM

## 2021-02-14 DIAGNOSIS — F32A Depression, unspecified: Secondary | ICD-10-CM | POA: Diagnosis present

## 2021-02-14 DIAGNOSIS — Z833 Family history of diabetes mellitus: Secondary | ICD-10-CM

## 2021-02-14 DIAGNOSIS — G8929 Other chronic pain: Secondary | ICD-10-CM | POA: Diagnosis present

## 2021-02-14 DIAGNOSIS — J189 Pneumonia, unspecified organism: Secondary | ICD-10-CM | POA: Diagnosis not present

## 2021-02-14 DIAGNOSIS — J181 Lobar pneumonia, unspecified organism: Principal | ICD-10-CM | POA: Diagnosis present

## 2021-02-14 DIAGNOSIS — J9601 Acute respiratory failure with hypoxia: Secondary | ICD-10-CM | POA: Diagnosis present

## 2021-02-14 DIAGNOSIS — Z79899 Other long term (current) drug therapy: Secondary | ICD-10-CM

## 2021-02-14 DIAGNOSIS — E559 Vitamin D deficiency, unspecified: Secondary | ICD-10-CM | POA: Diagnosis present

## 2021-02-14 DIAGNOSIS — Z20822 Contact with and (suspected) exposure to covid-19: Secondary | ICD-10-CM | POA: Diagnosis present

## 2021-02-14 DIAGNOSIS — Z7984 Long term (current) use of oral hypoglycemic drugs: Secondary | ICD-10-CM

## 2021-02-14 DIAGNOSIS — Z803 Family history of malignant neoplasm of breast: Secondary | ICD-10-CM

## 2021-02-14 DIAGNOSIS — Z888 Allergy status to other drugs, medicaments and biological substances status: Secondary | ICD-10-CM

## 2021-02-14 DIAGNOSIS — E1122 Type 2 diabetes mellitus with diabetic chronic kidney disease: Secondary | ICD-10-CM | POA: Diagnosis present

## 2021-02-14 DIAGNOSIS — R630 Anorexia: Secondary | ICD-10-CM | POA: Diagnosis present

## 2021-02-14 DIAGNOSIS — Z7982 Long term (current) use of aspirin: Secondary | ICD-10-CM

## 2021-02-14 DIAGNOSIS — I440 Atrioventricular block, first degree: Secondary | ICD-10-CM | POA: Diagnosis present

## 2021-02-14 DIAGNOSIS — I129 Hypertensive chronic kidney disease with stage 1 through stage 4 chronic kidney disease, or unspecified chronic kidney disease: Secondary | ICD-10-CM | POA: Diagnosis present

## 2021-02-14 DIAGNOSIS — N179 Acute kidney failure, unspecified: Secondary | ICD-10-CM | POA: Diagnosis present

## 2021-02-14 DIAGNOSIS — E1165 Type 2 diabetes mellitus with hyperglycemia: Secondary | ICD-10-CM | POA: Diagnosis present

## 2021-02-14 DIAGNOSIS — M797 Fibromyalgia: Secondary | ICD-10-CM | POA: Diagnosis present

## 2021-02-14 LAB — CBC
HCT: 36.4 % (ref 36.0–46.0)
Hemoglobin: 12.3 g/dL (ref 12.0–15.0)
MCH: 29.2 pg (ref 26.0–34.0)
MCHC: 33.8 g/dL (ref 30.0–36.0)
MCV: 86.5 fL (ref 80.0–100.0)
Platelets: 281 10*3/uL (ref 150–400)
RBC: 4.21 MIL/uL (ref 3.87–5.11)
RDW: 13.2 % (ref 11.5–15.5)
WBC: 9.7 10*3/uL (ref 4.0–10.5)
nRBC: 0 % (ref 0.0–0.2)

## 2021-02-14 LAB — BASIC METABOLIC PANEL
Anion gap: 10 (ref 5–15)
BUN: 72 mg/dL — ABNORMAL HIGH (ref 8–23)
CO2: 24 mmol/L (ref 22–32)
Calcium: 9.6 mg/dL (ref 8.9–10.3)
Chloride: 91 mmol/L — ABNORMAL LOW (ref 98–111)
Creatinine, Ser: 3.92 mg/dL — ABNORMAL HIGH (ref 0.44–1.00)
GFR, Estimated: 11 mL/min — ABNORMAL LOW (ref >60)
Glucose, Bld: 407 mg/dL — ABNORMAL HIGH (ref 70–99)
Potassium: 5.1 mmol/L (ref 3.5–5.1)
Sodium: 125 mmol/L — ABNORMAL LOW (ref 135–145)

## 2021-02-14 LAB — RESP PANEL BY RT-PCR (FLU A&B, COVID) ARPGX2
Influenza A by PCR: NEGATIVE
Influenza B by PCR: NEGATIVE
SARS Coronavirus 2 by RT PCR: NEGATIVE

## 2021-02-14 NOTE — ED Triage Notes (Signed)
Pt reports that she has had cough that is productive with a tan phlem she has also had congestion for the last two weeks. Denies any fever but has felt dizzy and weak

## 2021-02-15 ENCOUNTER — Inpatient Hospital Stay
Admission: EM | Admit: 2021-02-15 | Discharge: 2021-02-18 | DRG: 193 | Disposition: A | Discharge status: Home Health | Payer: Medicare Other | Attending: Internal Medicine | Admitting: Internal Medicine

## 2021-02-15 DIAGNOSIS — Z91048 Other nonmedicinal substance allergy status: Secondary | ICD-10-CM | POA: Diagnosis not present

## 2021-02-15 DIAGNOSIS — Z803 Family history of malignant neoplasm of breast: Secondary | ICD-10-CM | POA: Diagnosis not present

## 2021-02-15 DIAGNOSIS — Z7982 Long term (current) use of aspirin: Secondary | ICD-10-CM | POA: Diagnosis not present

## 2021-02-15 DIAGNOSIS — J189 Pneumonia, unspecified organism: Secondary | ICD-10-CM | POA: Diagnosis present

## 2021-02-15 DIAGNOSIS — E1165 Type 2 diabetes mellitus with hyperglycemia: Secondary | ICD-10-CM

## 2021-02-15 DIAGNOSIS — N189 Chronic kidney disease, unspecified: Secondary | ICD-10-CM | POA: Diagnosis not present

## 2021-02-15 DIAGNOSIS — I10 Essential (primary) hypertension: Secondary | ICD-10-CM | POA: Diagnosis present

## 2021-02-15 DIAGNOSIS — Z20822 Contact with and (suspected) exposure to covid-19: Secondary | ICD-10-CM | POA: Diagnosis present

## 2021-02-15 DIAGNOSIS — J45909 Unspecified asthma, uncomplicated: Secondary | ICD-10-CM | POA: Diagnosis present

## 2021-02-15 DIAGNOSIS — N1832 Chronic kidney disease, stage 3b: Secondary | ICD-10-CM | POA: Diagnosis present

## 2021-02-15 DIAGNOSIS — Z888 Allergy status to other drugs, medicaments and biological substances status: Secondary | ICD-10-CM | POA: Diagnosis not present

## 2021-02-15 DIAGNOSIS — E785 Hyperlipidemia, unspecified: Secondary | ICD-10-CM | POA: Diagnosis present

## 2021-02-15 DIAGNOSIS — I440 Atrioventricular block, first degree: Secondary | ICD-10-CM | POA: Diagnosis present

## 2021-02-15 DIAGNOSIS — E1122 Type 2 diabetes mellitus with diabetic chronic kidney disease: Secondary | ICD-10-CM | POA: Diagnosis present

## 2021-02-15 DIAGNOSIS — M797 Fibromyalgia: Secondary | ICD-10-CM | POA: Diagnosis present

## 2021-02-15 DIAGNOSIS — J181 Lobar pneumonia, unspecified organism: Secondary | ICD-10-CM | POA: Diagnosis present

## 2021-02-15 DIAGNOSIS — F32A Depression, unspecified: Secondary | ICD-10-CM | POA: Diagnosis present

## 2021-02-15 DIAGNOSIS — R630 Anorexia: Secondary | ICD-10-CM | POA: Diagnosis present

## 2021-02-15 DIAGNOSIS — Z833 Family history of diabetes mellitus: Secondary | ICD-10-CM | POA: Diagnosis not present

## 2021-02-15 DIAGNOSIS — G8929 Other chronic pain: Secondary | ICD-10-CM | POA: Diagnosis present

## 2021-02-15 DIAGNOSIS — Z794 Long term (current) use of insulin: Secondary | ICD-10-CM

## 2021-02-15 DIAGNOSIS — J9601 Acute respiratory failure with hypoxia: Secondary | ICD-10-CM | POA: Diagnosis present

## 2021-02-15 DIAGNOSIS — I129 Hypertensive chronic kidney disease with stage 1 through stage 4 chronic kidney disease, or unspecified chronic kidney disease: Secondary | ICD-10-CM | POA: Diagnosis present

## 2021-02-15 DIAGNOSIS — N179 Acute kidney failure, unspecified: Secondary | ICD-10-CM | POA: Diagnosis present

## 2021-02-15 DIAGNOSIS — Z79899 Other long term (current) drug therapy: Secondary | ICD-10-CM | POA: Diagnosis not present

## 2021-02-15 DIAGNOSIS — E1169 Type 2 diabetes mellitus with other specified complication: Secondary | ICD-10-CM

## 2021-02-15 DIAGNOSIS — Z7984 Long term (current) use of oral hypoglycemic drugs: Secondary | ICD-10-CM | POA: Diagnosis not present

## 2021-02-15 DIAGNOSIS — E559 Vitamin D deficiency, unspecified: Secondary | ICD-10-CM | POA: Diagnosis present

## 2021-02-15 LAB — CREATININE, SERUM
Creatinine, Ser: 3.45 mg/dL — ABNORMAL HIGH (ref 0.44–1.00)
GFR, Estimated: 13 mL/min — ABNORMAL LOW (ref >60)

## 2021-02-15 LAB — BASIC METABOLIC PANEL
Anion gap: 6 (ref 5–15)
BUN: 67 mg/dL — ABNORMAL HIGH (ref 8–23)
CO2: 25 mmol/L (ref 22–32)
Calcium: 8.9 mg/dL (ref 8.9–10.3)
Chloride: 100 mmol/L (ref 98–111)
Creatinine, Ser: 3.33 mg/dL — ABNORMAL HIGH (ref 0.44–1.00)
GFR, Estimated: 14 mL/min — ABNORMAL LOW (ref >60)
Glucose, Bld: 189 mg/dL — ABNORMAL HIGH (ref 70–99)
Potassium: 4 mmol/L (ref 3.5–5.1)
Sodium: 131 mmol/L — ABNORMAL LOW (ref 135–145)

## 2021-02-15 LAB — TROPONIN I (HIGH SENSITIVITY)
Troponin I (High Sensitivity): 13 ng/L (ref <18)
Troponin I (High Sensitivity): 14 ng/L (ref <18)

## 2021-02-15 LAB — CBC
HCT: 33 % — ABNORMAL LOW (ref 36.0–46.0)
Hemoglobin: 11.2 g/dL — ABNORMAL LOW (ref 12.0–15.0)
MCH: 28.6 pg (ref 26.0–34.0)
MCHC: 33.9 g/dL (ref 30.0–36.0)
MCV: 84.4 fL (ref 80.0–100.0)
Platelets: 281 10*3/uL (ref 150–400)
RBC: 3.91 MIL/uL (ref 3.87–5.11)
RDW: 13.3 % (ref 11.5–15.5)
WBC: 9.2 10*3/uL (ref 4.0–10.5)
nRBC: 0 % (ref 0.0–0.2)

## 2021-02-15 LAB — URINALYSIS, ROUTINE W REFLEX MICROSCOPIC
Bilirubin Urine: NEGATIVE
Glucose, UA: NEGATIVE mg/dL
Hgb urine dipstick: NEGATIVE
Nitrite: NEGATIVE
Protein, ur: NEGATIVE mg/dL
Specific Gravity, Urine: >1.03 — ABNORMAL HIGH (ref 1.005–1.030)
pH: 5.5 (ref 5.0–8.0)

## 2021-02-15 LAB — CBG MONITORING, ED: Glucose-Capillary: 314 mg/dL — ABNORMAL HIGH (ref 70–99)

## 2021-02-15 LAB — GLUCOSE, CAPILLARY
Glucose-Capillary: 252 mg/dL — ABNORMAL HIGH (ref 70–99)
Glucose-Capillary: 186 mg/dL — ABNORMAL HIGH (ref 70–99)
Glucose-Capillary: 131 mg/dL — ABNORMAL HIGH (ref 70–99)
Glucose-Capillary: 208 mg/dL — ABNORMAL HIGH (ref 70–99)
Glucose-Capillary: 245 mg/dL — ABNORMAL HIGH (ref 70–99)

## 2021-02-15 LAB — HEPATIC FUNCTION PANEL
ALT: 18 U/L (ref 0–44)
AST: 21 U/L (ref 15–41)
Albumin: 3.8 g/dL (ref 3.5–5.0)
Alkaline Phosphatase: 122 U/L (ref 38–126)
Bilirubin, Direct: <0.1 mg/dL (ref 0.0–0.2)
Total Bilirubin: 0.7 mg/dL (ref 0.3–1.2)
Total Protein: 8 g/dL (ref 6.5–8.1)

## 2021-02-15 LAB — LACTIC ACID, PLASMA
Lactic Acid, Venous: 1.6 mmol/L (ref 0.5–1.9)
Lactic Acid, Venous: 1.2 mmol/L (ref 0.5–1.9)

## 2021-02-15 LAB — URINALYSIS, MICROSCOPIC (REFLEX)

## 2021-02-15 LAB — STREP PNEUMONIAE URINARY ANTIGEN: Strep Pneumo Urinary Antigen: NEGATIVE

## 2021-02-15 MED ORDER — HYDROCODONE-ACETAMINOPHEN 5-325 MG PO TABS: 1.0000 | ORAL_TABLET | ORAL | Status: DC | PRN

## 2021-02-15 MED ORDER — INSULIN ASPART 100 UNIT/ML IJ SOLN
0.0000 [IU] | Freq: Every day | INTRAMUSCULAR | Status: DC
  Administered 2021-02-15: 23:00:00 2 [IU] via SUBCUTANEOUS
  Administered 2021-02-15: 04:00:00 3 [IU] via SUBCUTANEOUS
  Filled 2021-02-15 (×2): qty 1

## 2021-02-15 MED ORDER — ENOXAPARIN SODIUM 30 MG/0.3ML IJ SOSY
30.0000 mg | PREFILLED_SYRINGE | INTRAMUSCULAR | Status: DC
  Administered 2021-02-15 – 2021-02-17 (×3): 30 mg via SUBCUTANEOUS
  Filled 2021-02-15 (×3): qty 0.3

## 2021-02-15 MED ORDER — SODIUM CHLORIDE 0.9 % IV SOLN
500.0000 mg | INTRAVENOUS | Status: DC
  Administered 2021-02-15 – 2021-02-17 (×3): 500 mg via INTRAVENOUS
  Filled 2021-02-15 (×3): qty 5
  Filled 2021-02-15: qty 500

## 2021-02-15 MED ORDER — SODIUM CHLORIDE 0.9 % IV SOLN
2.0000 g | INTRAVENOUS | Status: DC
  Administered 2021-02-16 – 2021-02-18 (×3): 2 g via INTRAVENOUS
  Filled 2021-02-15 (×5): qty 20

## 2021-02-15 MED ORDER — ACETAMINOPHEN 325 MG PO TABS
650.0000 mg | ORAL_TABLET | Freq: Four times a day (QID) | ORAL | Status: DC | PRN
  Filled 2021-02-15: qty 2

## 2021-02-15 MED ORDER — AZITHROMYCIN 500 MG PO TABS
500.0000 mg | ORAL_TABLET | Freq: Once | ORAL | Status: AC
  Administered 2021-02-15: 02:00:00 500 mg via ORAL
  Filled 2021-02-15: qty 1

## 2021-02-15 MED ORDER — ONDANSETRON HCL 4 MG PO TABS: 4.0000 mg | ORAL_TABLET | Freq: Four times a day (QID) | ORAL | Status: DC | PRN

## 2021-02-15 MED ORDER — SODIUM CHLORIDE 0.9 % IV BOLUS
1000.0000 mL | Freq: Once | INTRAVENOUS | Status: AC
  Administered 2021-02-15: 02:00:00 1000 mL via INTRAVENOUS

## 2021-02-15 MED ORDER — ONDANSETRON HCL 4 MG/2ML IJ SOLN: 4.0000 mg | Freq: Four times a day (QID) | INTRAMUSCULAR | Status: DC | PRN

## 2021-02-15 MED ORDER — SODIUM CHLORIDE 0.9 % IV SOLN
1.0000 g | Freq: Once | INTRAVENOUS | Status: AC
  Administered 2021-02-15: 02:00:00 1 g via INTRAVENOUS
  Filled 2021-02-15: qty 10

## 2021-02-15 MED ORDER — INSULIN GLARGINE-YFGN 100 UNIT/ML ~~LOC~~ SOLN
20.0000 [IU] | Freq: Every day | SUBCUTANEOUS | Status: DC
  Administered 2021-02-15 – 2021-02-17 (×3): 20 [IU] via SUBCUTANEOUS
  Filled 2021-02-15 (×4): qty 0.2

## 2021-02-15 MED ORDER — SODIUM CHLORIDE 0.9 % IV SOLN: INTRAVENOUS | Status: DC

## 2021-02-15 MED ORDER — ACETAMINOPHEN 650 MG RE SUPP
650.0000 mg | Freq: Four times a day (QID) | RECTAL | Status: DC | PRN
  Filled 2021-02-15: qty 1

## 2021-02-15 MED ORDER — INSULIN ASPART 100 UNIT/ML IJ SOLN
0.0000 [IU] | Freq: Three times a day (TID) | INTRAMUSCULAR | Status: DC
  Administered 2021-02-15: 13:00:00 2 [IU] via SUBCUTANEOUS
  Administered 2021-02-15: 17:00:00 5 [IU] via SUBCUTANEOUS
  Administered 2021-02-15 – 2021-02-16 (×2): 3 [IU] via SUBCUTANEOUS
  Administered 2021-02-16 (×2): 2 [IU] via SUBCUTANEOUS
  Administered 2021-02-17 – 2021-02-18 (×2): 3 [IU] via SUBCUTANEOUS
  Filled 2021-02-15 (×8): qty 1

## 2021-02-15 MED ORDER — SODIUM CHLORIDE 0.9 % IV SOLN: 2.0000 g | INTRAVENOUS | Status: DC

## 2021-02-15 NOTE — Progress Notes (Signed)
Inpatient Diabetes Program Recommendations  AACE/ADA: New Consensus Statement on Inpatient Glycemic Control (2015)  Target Ranges:  Prepandial:   less than 140 mg/dL      Peak postprandial:   less than 180 mg/dL (1-2 hours)      Critically ill patients:  140 - 180 mg/dL   Lab Results  Component Value Date   GLUCAP 131 (H) 02/15/2021   HGBA1C 8.1 (H) 07/30/2018    Review of Glycemic Control  Latest Reference Range & Units 02/15/21 03:32 02/15/21 07:44 02/15/21 11:30  Glucose-Capillary 70 - 99 mg/dL 252 (H) 186 (H) 131 (H)   Diabetes history: DM 2 Outpatient Diabetes medications:  Lantus 40 units daily, Januvia 100 mg daily Current orders for Inpatient glycemic control:  Novolog moderate tid with meals and HS  Inpatient Diabetes Program Recommendations:    May consider adding Semglee 20 units daily (this is 1/2 of home dose).    Thanks,  Adah Perl, RN, BC-ADM Inpatient Diabetes Coordinator Pager [phone]  (8a-5p)

## 2021-02-15 NOTE — Plan of Care (Signed)
°  Problem: Education: Goal: Knowledge of General Education information will improve Description: Including pain rating scale, medication(s)/side effects and non-pharmacologic comfort measures Outcome: Progressing   Problem: Health Behavior/Discharge Planning: Goal: Ability to manage health-related needs will improve Outcome: Progressing   Problem: Clinical Measurements: Goal: Respiratory complications will improve Outcome: Progressing Goal: Cardiovascular complication will be avoided Outcome: Progressing   Problem: Activity: Goal: Risk for activity intolerance will decrease Outcome: Progressing   Problem: Pain Managment: Goal: General experience of comfort will improve Outcome: Progressing   Problem: Safety: Goal: Ability to remain free from injury will improve Outcome: Progressing   Problem: Skin Integrity: Goal: Risk for impaired skin integrity will decrease Outcome: Progressing

## 2021-02-15 NOTE — ED Provider Notes (Signed)
[facility] Emergency Department Provider Note  ____________________________________________   Event Date/Time   First MD Initiated Contact with Patient 02/15/21 0033     (approximate)  I have reviewed the triage vital signs and the nursing notes.   HISTORY  Chief Complaint Weakness  HPI Theresa Booker is a 75 y.o. female with CKD, diabetes, fibromyalgia who comes in with concern for cough and congestion.  Patient reports not feeling well for the past 2 weeks with cough, congestion, productive tan phlegm.  Denies any fevers but reports feeling really weak and dizzy.  She denies any falls or hitting her head.  She does states that when she moves she feels dizzy.  She has some dizziness at rest as well.  Nothing makes it better.  Does report a little bit of decreased appetite.  Patient has not been really compliant with her medications.          Past Medical History:  Diagnosis Date   Asthma    Atypical chest pain    neg ETT at [facility]   CKD (chronic kidney disease), stage III ([place])    Depression    Diabetes mellitus without complication ([place])    Fibromyalgia    HLD (hyperlipidemia)    Hypertension    Vitamin D deficiency     Patient Active Problem List   Diagnosis Date Noted   Diabetes ([place]) 06/09/2020   SDH (subdural hematoma) 06/05/2019   Sepsis ([place]) 08/07/2018   AMS (altered mental status) 07/30/2018   Altered mental status 07/29/2018   Rotator cuff tendinitis, right 04/01/2017   Traumatic complete tear of right rotator cuff 04/01/2017   Dysphagia, unspecified 09/28/2015   Cocaine use 05/03/2015   Abnormal MRI, lumbar spine 05/03/2015   Numbness in feet 04/29/2015   Gait instability 04/29/2015   BP (high blood pressure) 04/13/2015   Fibromyalgia 04/13/2015   Essential (primary) hypertension 04/13/2015   Clinical depression 04/13/2015   Chronic kidney disease (CKD), stage III (moderate) ([place]) 04/13/2015   Airway hyperreactivity  04/13/2015   Chronic knee pain (Right) 04/13/2015   Chronic low back pain (Location of Secondary source of pain) (Bilateral) (R>L) 04/13/2015   Chronic lower straight pain (Location of Primary Source of Pain) (Right) 04/13/2015   Chronic lumbar radicular pain (Right) (L5 Dermatome) 04/13/2015   At high risk for falls 04/13/2015   Chronic hip pain (Right) 04/13/2015   Chronic sacroiliac joint pain (Bilateral) (R>L) 04/13/2015   Controlled type 2 diabetes mellitus without complication ([place]) 79/15/0569   Headache disorder 02/01/2015   Dizziness 02/01/2015   Bad posture 02/01/2015   Episodic tension type headache 01/10/2015   Amnesia 10/18/2014   Combined fat and carbohydrate induced hyperlipemia 09/24/2013   Acid reflux 09/24/2013   Chronic pain 09/24/2013   Avitaminosis D 02/11/2013    Past Surgical History:  Procedure Laterality Date   ABDOMINAL HYSTERECTOMY     COLONOSCOPY WITH PROPOFOL N/A 05/22/2016   Procedure: COLONOSCOPY WITH PROPOFOL;  Surgeon: Lollie Sails, MD;  Location: [facility] ENDOSCOPY;  Service: Endoscopy;  Laterality: N/A;   ESOPHAGOGASTRODUODENOSCOPY (EGD) WITH PROPOFOL N/A 05/22/2016   Procedure: ESOPHAGOGASTRODUODENOSCOPY (EGD) WITH PROPOFOL;  Surgeon: Lollie Sails, MD;  Location: [facility] ENDOSCOPY;  Service: Endoscopy;  Laterality: N/A;   HAND SURGERY Right 2014   Dr Rudene Christians   rotator cuff surgery Left 03/2013   DR Rudene Christians   SHOULDER ARTHROSCOPY WITH OPEN ROTATOR CUFF REPAIR Right 04/25/2017   Procedure: SHOULDER ARTHROSCOPY WITH OPEN ROTATOR CUFF REPAIR;  Surgeon: Corky Mull, MD;  Location: [facility] ORS;  Service: Orthopedics;  Laterality: Right;   SHOULDER ARTHROSCOPY WITH OPEN ROTATOR CUFF REPAIR Right 03/11/2018   Procedure: SHOULDER ARTHROSCOPY WITH Recurrent  OPEN ROTATOR CUFF REPAIR;  Surgeon: Corky Mull, MD;  Location: [facility] ORS;  Service: Orthopedics;  Laterality: Right;  debridment and decompression    Prior to Admission medications   Medication Sig Start  Date End Date Taking? Authorizing Provider  acetaminophen (TYLENOL) 500 MG tablet Take 500-1,000 mg by mouth every 6 (six) hours as needed for mild pain or headache.     [provider]  albuterol (PROAIR HFA) 108 (90 BASE) MCG/ACT inhaler Inhale 2 puffs into the lungs every 6 (six) hours as needed for wheezing or shortness of breath.     [provider]  aspirin EC 81 MG tablet Take 81 mg by mouth daily. 03/02/20   [provider]  blood glucose meter kit and supplies KIT Dispense based on patient and insurance preference. Use up to four times daily as directed. (FOR ICD-9 250.00, 250.01). 08/11/18   Epifanio Lesches, MD  celecoxib (CELEBREX) 100 MG capsule Take 100 mg by mouth 2 (two) times daily. 06/06/20   [provider]  Cholecalciferol (VITAMIN D3) 50 MCG (2000 UT) TABS Take 2,000 Units by mouth daily.    [provider]  diclofenac sodium (VOLTAREN) 1 % GEL Apply 2 g topically 2 (two) times daily as needed. Patient taking differently: Apply 2 g topically 2 (two) times daily as needed (for pain).  09/30/17   Merlyn Lot, MD  diclofenac Sodium (VOLTAREN) 1 % GEL Apply topically. 05/09/20   [provider]  donepezil (ARICEPT) 10 MG tablet Take by mouth. 09/28/15   [provider]  ezetimibe (ZETIA) 10 MG tablet Take 10 mg by mouth daily. 06/06/20   [provider]  fluticasone furoate-vilanterol (BREO ELLIPTA) 100-25 MCG/INH AEPB Inhale into the lungs. 01/22/19   [provider]  gabapentin (NEURONTIN) 300 MG capsule Take by mouth. 04/02/16   [provider]  hydrochlorothiazide (HYDRODIURIL) 25 MG tablet Take 25 mg by mouth daily.     [provider]  Insulin Glargine (LANTUS SOLOSTAR) 100 UNIT/ML Solostar Pen Inject 43 Units into the skin daily.    [provider]  isosorbide mononitrate (IMDUR) 30 MG 24 hr tablet Take 30 mg by mouth daily.    [provider]  loratadine  (CLARITIN) 10 MG tablet Take 10 mg by mouth daily.    [provider]  losartan (COZAAR) 25 MG tablet Take 25 mg by mouth daily.    [provider]  Multiple Vitamin (MULTIVITAMIN WITH MINERALS) TABS tablet Take 1 tablet by mouth daily. 07/31/18   Salary, Avel Peace, MD  naproxen (NAPROSYN) 500 MG tablet Take 1 tablet (500 mg total) by mouth 2 (two) times daily with a meal. 08/22/18   Lavonia Drafts, MD  nortriptyline (PAMELOR) 10 MG capsule Take 30 mg by mouth at bedtime.     [provider]  omeprazole (PRILOSEC) 20 MG capsule Take 20 mg by mouth daily.     [provider]  ondansetron (ZOFRAN ODT) 4 MG disintegrating tablet Take 1 tablet (4 mg total) by mouth every 8 (eight) hours as needed for nausea or vomiting. 03/11/18   Poggi, Marshall Cork, MD  oxyCODONE (ROXICODONE) 5 MG immediate release tablet Take 1-2 tablets (5-10 mg total) by mouth every 4 (four) hours as needed. 03/11/18   Poggi, Marshall Cork,  MD  polyethylene glycol (MIRALAX) 17 g packet Take 17 g by mouth daily as needed for moderate constipation. 09/11/18   Gregor Hams, MD  pravastatin (PRAVACHOL) 40 MG tablet Take by mouth. 05/29/18   [provider]  simvastatin (ZOCOR) 40 MG tablet Take by mouth. 02/07/12   [provider]  sitaGLIPtin (JANUVIA) 100 MG tablet Take 100 mg by mouth daily.    [provider]  SUMAtriptan (IMITREX) 100 MG tablet Take 100 mg by mouth every 2 (two) hours as needed for migraine. May repeat in 2 hours if headache persists or recurs.    [provider]  venlafaxine XR (EFFEXOR-XR) 150 MG 24 hr capsule Take 150 mg by mouth daily with breakfast.     [provider]  Vitamins/Minerals TABS Take by mouth.    [provider]    Allergies Ace inhibitors, Pollen extracts [pollen extract], and Vicodin [hydrocodone-acetaminophen]  Family History  Problem Relation Age of Onset   Diabetes Mother    Breast cancer Mother 18   Cancer  Sister    Cancer Father    Liver disease Brother    Breast cancer Maternal Aunt    Breast cancer Cousin        maternal    Social History Social History   Tobacco Use   Smoking status: Never   Smokeless tobacco: Never  Vaping Use   Vaping Use: Never used  Substance Use Topics   Alcohol use: No    Alcohol/week: 0.0 standard drinks   Drug use: No      Review of Systems Constitutional: No fever/chills Eyes: No visual changes. ENT: No sore throat.  Positive congestion Cardiovascular: Denies chest pain. Respiratory: Denies shortness of breath.  Cough, congestion Gastrointestinal: No abdominal pain.  No nausea, no vomiting.  No diarrhea.  No constipation. Genitourinary: Negative for dysuria. Musculoskeletal: Negative for back pain. Skin: Negative for rash. Neurological: Negative for headaches, focal weakness or numbness.  Dizzy All other ROS negative ____________________________________________   PHYSICAL EXAM:  VITAL SIGNS: ED Triage Vitals  Enc Vitals Group     BP 02/14/21 1955 (!) 108/56     Pulse Rate 02/14/21 1955 83     Resp 02/14/21 1955 20     Temp 02/14/21 1955 98.3 F (36.8 C)     Temp Source 02/14/21 1955 Oral     SpO2 02/14/21 1955 94 %     Weight 02/14/21 2011 170 lb (77.1 kg)     Height 02/14/21 2011 5' (1.524 m)     Head Circumference --      Peak Flow --      Pain Score 02/14/21 2011 0     Pain Loc --      Pain Edu? --      Excl. in [place]? --     Constitutional: Alert and oriented. Well appearing and in no acute distress. Eyes: Conjunctivae are normal. EOMI. Head: Atraumatic. Nose: No congestion/rhinnorhea. Mouth/Throat: Mucous membranes are moist.   Neck: No stridor. Trachea Midline. FROM Cardiovascular: Normal rate, regular rhythm. Grossly normal heart sounds.  Good peripheral circulation. Respiratory: Normal respiratory effort.  No retractions. Lungs CTAB. Gastrointestinal: Soft and nontender. No distention. No abdominal bruits.   Musculoskeletal: No lower extremity tenderness nor edema.  No joint effusions. Neurologic:  Normal speech and language. No gross focal neurologic deficits are appreciated.  Clear nerves II to XII are intact.  Equal strength in arms and legs.  Finger-nose intact bilaterally. Skin:  Skin is  warm, dry and intact. No rash noted. Psychiatric: Mood and affect are normal. Speech and behavior are normal. GU: Deferred   ____________________________________________   LABS (all labs ordered are listed, but only abnormal results are displayed)  Labs Reviewed  BASIC METABOLIC PANEL - Abnormal; Notable for the following components:      Result Value   Sodium 125 (*)    Chloride 91 (*)    Glucose, Bld 407 (*)    BUN 72 (*)    Creatinine, Ser 3.92 (*)    GFR, Estimated 11 (*)    All other components within normal limits  RESP PANEL BY RT-PCR (FLU A&B, COVID) ARPGX2  CBC  URINALYSIS, ROUTINE W REFLEX MICROSCOPIC  CBG MONITORING, ED   ____________________________________________   ED ECG REPORT I, Vanessa [place], the attending physician, personally viewed and interpreted this ECG.  Normal sinus rate of 83, no ST elevations, no T wave inversions, type I AV block ____________________________________________  RADIOLOGY Robert Bellow, personally viewed and evaluated these images (plain radiographs) as part of my medical decision making, as well as reviewing the written report by the radiologist.  ED MD interpretation:  ll pna   Official radiology report(s): DG Chest 2 View  Result Date: 02/14/2021 CLINICAL DATA:  Cough EXAM: CHEST - 2 VIEW COMPARISON:  05/27/2019 FINDINGS: Linear atelectasis or scar at the left base. Patchy left lower lobe opacity, possible pneumonia. Normal cardiomediastinal silhouette. No pneumothorax. IMPRESSION: Possible patchy left lower lobe pneumonia. Radiographic follow-up to resolution is recommended. Electronically Signed   By: Donavan Foil M.D.   On: 02/14/2021  22:30    ____________________________________________   PROCEDURES  Procedure(s) performed (including Critical Care):  .1-3 Lead EKG Interpretation Performed by: Vanessa [place], MD Authorized by: Vanessa , MD     Interpretation: normal     ECG rate:  80s   ECG rate assessment: normal     Rhythm: sinus rhythm     Ectopy: none     Conduction: abnormal     Abnormal conduction: 1st degree AV block     ____________________________________________   INITIAL IMPRESSION / ASSESSMENT AND PLAN / ED COURSE  KARIE SKOWRON was evaluated in Emergency Department on 02/15/2021 for the symptoms described in the history of present illness. She was evaluated in the context of the global COVID-19 pandemic, which necessitated consideration that the patient might be at risk for infection with the SARS-CoV-2 virus that causes COVID-19. Institutional protocols and algorithms that pertain to the evaluation of patients at risk for COVID-19 are in a state of rapid change based on information released by regulatory bodies including the CDC and federal and state organizations. These policies and algorithms were followed during the patient's care in the ED.    Labs ordered to evaluate for [place] abnormalities, AKI, UTI, COVID, flu, chest x-ray evaluate for pneumonia.  Low suspicion for posterior stroke given reassuring neuro exam.   Patient's COVID, flu are negative. Her kidney function is significantly elevated at 3.92 and her last creatinine was 1.08 and her sugars are elevated at 407 but she has no anion gap elevation.  Her corrected sodium is 130. Her chest x-ray is concerning for a left lower lobe pneumonia.  She does not meet any sepsis criteria therefore held off on blood cultures.  We will start on ceftriaxone and azithromycin and admit to the medicine team due to new AKI  Family is aware that she will need repeat x-ray to ensure that this  pneumonia is resolving in a few weeks.        ____________________________________________   FINAL CLINICAL IMPRESSION(S) / ED DIAGNOSES   Final diagnoses:  AKI (acute kidney injury) ([place])  Community acquired pneumonia of left lower lobe of lung      MEDICATIONS GIVEN DURING THIS VISIT:  Medications  sodium chloride 0.9 % bolus 1,000 mL (has no administration in time range)  cefTRIAXone (ROCEPHIN) 1 g in sodium chloride 0.9 % 100 mL IVPB (has no administration in time range)  azithromycin (ZITHROMAX) tablet 500 mg (has no administration in time range)     ED Discharge Orders     None        Note:  This document was prepared using Dragon voice recognition software and may include unintentional dictation errors.    Vanessa [place], MD 02/15/21 [phone]

## 2021-02-15 NOTE — Progress Notes (Signed)
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   AKI on CKDIIIb: Cr is labile. Avoid nephrotoxic meds   DM2: likely poorly controlled, HbA1c is pending. Continue on glargine, SSI w/ accuchecks    HTN: hold home dose of losartan, HCTZ secondary to AKI on CKD & low normal BP     Chronic pain: continue on norco   DVT prophylaxis: lovenox  Code Status: full  Family Communication: Disposition Plan: depends on PT/OT recs  Level of care: Med-Surg  Status is: Inpatient  Remains inpatient appropriate because: severity of illness, requiring IV abxs    Consultants:    Procedures:   Antimicrobials: rocephin, azithromycin    Subjective: Pt c/o fatigue   Objective: Vitals:   02/15/21 0052 02/15/21 0200 02/15/21 0337 02/15/21 0743  BP: (!) 127/94 105/64 112/79 97/72  Pulse: 80 70 (!) 59 75  Resp: 20 17 20 19   Temp:   97.8 F (36.6 C) 97.8 F (36.6 C)  TempSrc:   Oral Oral  SpO2: 100% 99% (!) 82% 98%  Weight:      Height:        Intake/Output Summary (Last 24 hours) at 02/15/2021 1443 Last data filed at 02/15/2021 1030 Gross per 24 hour  Intake 120 ml  Output --  Net 120 ml   Filed Weights   02/14/21 2011  Weight: 77.1 kg    Examination:  General exam: Appears calm and comfortable  Respiratory system: diminished breath sounds  Cardiovascular system: S1 & S2+. No  rubs, gallops or clicks.  Gastrointestinal system: Abdomen is nondistended, soft and nontender. Normal bowel sounds heard. Central nervous system: Alert and awake. Moves all extremities   Psychiatry: Judgement and insight appear poor. Flat mood and affect    Data Reviewed: I have personally reviewed following labs and imaging studies  CBC: Recent Labs  Lab 02/14/21 1956 02/15/21 0802  WBC 9.7 9.2  HGB 12.3 11.2*  HCT 36.4 33.0*  MCV 86.5 84.4  PLT 281 433   Basic Metabolic Panel: Recent Labs  Lab 02/14/21 1956 02/15/21 0802  NA 125* 131*  K 5.1 4.0  CL 91* 100  CO2 24 25  GLUCOSE 407* 189*  BUN 72* 67*  CREATININE 3.92* 3.33*   3.45*  CALCIUM 9.6 8.9   GFR: Estimated Creatinine Clearance: 12.5 mL/min (A) (by C-G formula based on SCr of 3.45 mg/dL (H)). Liver Function Tests: Recent Labs  Lab 02/14/21 1956  AST 21  ALT 18  ALKPHOS 122  BILITOT 0.7  PROT 8.0  ALBUMIN 3.8   No results for input(s): LIPASE, AMYLASE in the last 168 hours. No results for input(s): AMMONIA in the last 168 hours. Coagulation Profile: No results for input(s): INR, PROTIME in the last 168 hours. Cardiac Enzymes: No results for input(s): CKTOTAL, CKMB, CKMBINDEX, TROPONINI in the last 168 hours. BNP (last 3 results) No results for input(s): PROBNP in the last 8760 hours. HbA1C: No results for input(s): HGBA1C in the last 72 hours. CBG: Recent Labs  Lab 02/15/21 0100 02/15/21 0332 02/15/21 0744 02/15/21 1130  GLUCAP 314* 252* 186*  131*   Lipid Profile: No results for input(s): CHOL, HDL, LDLCALC, TRIG, CHOLHDL, LDLDIRECT in the last 72 hours. Thyroid Function Tests: No results for input(s): TSH, T4TOTAL, FREET4, T3FREE, THYROIDAB in the last 72 hours. Anemia Panel: No results for input(s): VITAMINB12, FOLATE, FERRITIN, TIBC, IRON, RETICCTPCT in the last 72 hours. Sepsis Labs: Recent Labs  Lab 02/15/21 0144 02/15/21 0802  LATICACIDVEN 1.6 1.2    Recent Results (from the past 240 hour(s))  Resp Panel by RT-PCR (Flu A&B, Covid) Nasopharyngeal Swab     Status: None   Collection Time: 02/14/21  7:56 PM   Specimen: Nasopharyngeal Swab; Nasopharyngeal(NP)  swabs in vial transport medium  Result Value Ref Range Status   SARS Coronavirus 2 by RT PCR NEGATIVE NEGATIVE Final    Comment: (NOTE) SARS-CoV-2 target nucleic acids are NOT DETECTED.  The SARS-CoV-2 RNA is generally detectable in upper respiratory specimens during the acute phase of infection. The lowest concentration of SARS-CoV-2 viral copies this assay can detect is 138 copies/mL. A negative result does not preclude SARS-Cov-2 infection and should not be used as the sole basis for treatment or other patient management decisions. A negative result may occur with  improper specimen collection/handling, submission of specimen other than nasopharyngeal swab, presence of viral mutation(s) within the areas targeted by this assay, and inadequate number of viral copies(<138 copies/mL). A negative result must be combined with clinical observations, patient history, and epidemiological information. The expected result is Negative.  Fact Sheet for Patients:  EntrepreneurPulse.com.au  Fact Sheet for Healthcare Providers:  IncredibleEmployment.be  This test is no t yet approved or cleared by the [place] FDA and  has been authorized for detection and/or diagnosis of SARS-CoV-2 by FDA under an Emergency Use Authorization (EUA). This EUA will remain  in effect (meaning this test can be used) for the duration of the COVID-19 declaration under Section 564(b)(1) of the Act, 21 U.S.C.section 360bbb-3(b)(1), unless the authorization is terminated  or revoked sooner.       Influenza A by PCR NEGATIVE NEGATIVE Final   Influenza B by PCR NEGATIVE NEGATIVE Final    Comment: (NOTE) The Xpert Xpress SARS-CoV-2/FLU/RSV plus assay is intended as an aid in the diagnosis of influenza from Nasopharyngeal swab specimens and should not be used as a sole basis for treatment. Nasal washings and aspirates are unacceptable for Xpert Xpress  SARS-CoV-2/FLU/RSV testing.  Fact Sheet for Patients: EntrepreneurPulse.com.au  Fact Sheet for Healthcare Providers: IncredibleEmployment.be  This test is not yet approved or cleared by the [place] FDA and has been authorized for detection and/or diagnosis of SARS-CoV-2 by FDA under an Emergency Use Authorization (EUA). This EUA will remain in effect (meaning this test can be used) for the duration of the COVID-19 declaration under Section 564(b)(1) of the Act, 21 U.S.C. section 360bbb-3(b)(1), unless the authorization is terminated or revoked.  Performed at [facility], [address] [place], [place] [address]   Blood culture (routine x 2)     Status: None (Preliminary result)   Collection Time: 02/15/21  1:44 AM   Specimen: BLOOD  Result Value Ref Range Status   Specimen Description BLOOD RIGHT ASSIST CONTROL  Final   Special Requests   Final    BOTTLES DRAWN AEROBIC AND ANAEROBIC Blood Culture results may not be optimal due to an excessive volume of blood received in culture bottles   Culture   Final    NO GROWTH < 12 HOURS Performed at [facility], [address] [place], [place] [address]    Report Status PENDING  Incomplete  Blood culture (routine x 2)     Status: None (Preliminary result)   Collection Time: 02/15/21  1:44 AM   Specimen: BLOOD  Result Value Ref Range Status   Specimen Description BLOOD RIGHT ASSIST CONTROL  Final   Special Requests   Final    BOTTLES DRAWN AEROBIC AND ANAEROBIC Blood Culture adequate volume   Culture   Final    NO GROWTH < 12 HOURS Performed at [facility], [address], [place], [place] [address]    Report Status PENDING  Incomplete         Radiology Studies: DG Chest 2 View  Result Date: 02/14/2021 CLINICAL DATA:  Cough EXAM: CHEST - 2 VIEW COMPARISON:  05/27/2019 FINDINGS: Linear atelectasis or scar at the left base. Patchy left lower lobe  opacity, possible pneumonia. Normal cardiomediastinal silhouette. No pneumothorax. IMPRESSION: Possible patchy left lower lobe pneumonia. Radiographic follow-up to resolution is recommended. Electronically Signed   By: Donavan Foil M.D.   On: 02/14/2021 22:30        Scheduled Meds:  enoxaparin (LOVENOX) injection  30 mg Subcutaneous Q24H   insulin aspart  0-15 Units Subcutaneous TID WC   insulin aspart  0-5 Units Subcutaneous QHS   insulin glargine-yfgn  20 Units Subcutaneous QHS   Continuous Infusions:  sodium chloride 100 mL/hr at 02/15/21 1116   azithromycin     [START ON 02/16/2021] cefTRIAXone (ROCEPHIN)  IV       LOS: 0 days    Time spent: 33 mins    Wyvonnia Dusky, MD [facility] Pager [phone]  If 7PM-7AM, please contact night-coverage  02/15/2021, 2:43 PM

## 2021-02-15 NOTE — Progress Notes (Signed)
PHARMACIST - PHYSICIAN COMMUNICATION  CONCERNING:  Enoxaparin (Lovenox) for DVT Prophylaxis    RECOMMENDATION: Patient was prescribed enoxaprin 40mg  q24 hours for VTE prophylaxis.   Filed Weights   02/14/21 2011  Weight: 77.1 kg (170 lb)    Body mass index is 33.2 kg/m.  Estimated Creatinine Clearance: 11 mL/min (A) (by C-G formula based on SCr of 3.92 mg/dL (H)).   Patient is candidate for enoxaparin 30mg  every 24 hours based on CrCl <71ml/min or Weight <45kg  DESCRIPTION: Pharmacy has adjusted enoxaparin dose per [facility] policy.  Patient is now receiving enoxaparin 30 mg every 24 hours   Renda Rolls, PharmD, [facility] 02/15/2021 2:02 AM

## 2021-02-15 NOTE — TOC Progression Note (Signed)
Transition of Care [facility]) - Progression Note    Patient Details  Name: Theresa Booker MRN: [id number] Date of Birth: 06-17-1945  Transition of [facility]) CM/SW Contact  Beverly Sessions, RN Phone Number: 02/15/2021, 9:49 AM  Clinical Narrative:      Transition of Care [facility]) Screening Note   Patient Details  Name: Theresa Booker Date of Birth: 03-24-1945   Transition of Care [facility]) CM/SW Contact:    Beverly Sessions, RN Phone Number: 02/15/2021, 9:49 AM    Transition of Care Department [facility]) has reviewed patient and no TOC needs have been identified at this time. We will continue to monitor patient advancement through interdisciplinary progression rounds. If new patient transition needs arise, please place a TOC consult.         Expected Discharge Plan and Services                                                 Social Determinants of Health (SDOH) Interventions    Readmission Risk Interventions Readmission Risk Prevention Plan 08/09/2018  Transportation Screening Complete  Medication Review ([place]) Complete  HRI or Home Care Consult Complete  Some recent data might be hidden

## 2021-02-15 NOTE — H&P (Signed)
History and Physical    Theresa Booker [id number] DOB: [birth year] DOA: 02/15/2021  PCP: [facility], [facility]   Patient coming from: home  I have personally briefly reviewed patient's relevant medical records in [place]  Chief Complaint: cough, malaise  HPI: Theresa Booker is a 75 y.o. female with medical history significant for DM, HTN, fibromyalgia and chronic pain, depression who presents to the ED with a 2-week history of cough productive of yellow phlegm, congestion, generalized malaise and dizziness.  She denies fever or chills and denies muscle aches.  Denies chest pain, nausea, vomiting, abdominal pain or diarrhea but has had decreased oral intake due to poor appetite  ED course: BP 108/56 on arrival with otherwise normal vitals Blood work WBC normal BMP significant for creatinine of 3.92, up from baseline of 1.53 and glucose of 4 7 Troponin 13 COVID and flu negative  EKG, personally viewed and interpreted: NSR at 83 with nonspecific ST-T wave changes  Chest x-ray: Possible patchy left lower lobe pneumonia  Patient started on Rocephin and azithromycin and given an IV fluid bolus.  Hospitalist consulted for admission.   Review of Systems: As per HPI otherwise all other systems on review of systems negative.   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 ([place]) - IV hydration and monitor renal function - Hold home celecoxib, hydrochlorothiazide, losartan    Hyperglycemia due to type 2 diabetes mellitus (HCC) - Sliding scale insulin coverage    Essential (primary) hypertension - Hold home hydrochlorothiazide and losartan because of AKI - Hydralazine 25 3 times daily    Chronic pain - Continue oxycodone as needed   DVT prophylaxis: Lovenox  Code Status: full code  Family  Communication:  none  Disposition Plan: Back to previous home environment Consults called: none  Status:At the time of admission, it appears that the appropriate admission status for this patient is INPATIENT. This is judged to be reasonable and necessary in order to provide the required intensity of service to ensure the patient's safety given the presenting symptoms, physical exam findings, and initial radiographic and laboratory data in the context of their  Comorbid conditions.   Patient requires inpatient status due to high intensity of service, high risk for further deterioration and high frequency of surveillance required.   I certify that at the point of admission it is my clinical judgment that the patient will require inpatient hospital care spanning beyond 2 midnights     Physical Exam: Vitals:   02/14/21 1955 02/14/21 2011 02/15/21 0052  BP: (!) 108/56  (!) 127/94  Pulse: 83  80  Resp: 20  20  Temp: 98.3 F (36.8 C)    TempSrc: Oral    SpO2: 94%  100%  Weight:  77.1 kg   Height:  5' (1.524 m)    Constitutional: Frail appearing, oriented x 3 . Not in any apparent distress HEENT:      Head: Normocephalic and atraumatic.         Eyes: PERLA, EOMI, Conjunctivae are normal. Sclera is non-icteric.       Mouth/Throat: Mucous membranes are moist.       Neck: Supple with no signs of meningismus. Cardiovascular: Regular rate and rhythm. No murmurs, gallops, or rubs. 2+ symmetrical distal pulses are present . No JVD. No  LE edema Respiratory: Respiratory effort normal .Lungs sounds decreased left base with basilar crackles on left Gastrointestinal: Soft, non tender, non distended. Positive bowel sounds.  Genitourinary: No CVA tenderness. Musculoskeletal: Nontender with normal range of motion in all extremities. No cyanosis, or erythema of extremities. Neurologic:  Face is symmetric. Moving all extremities. No gross focal neurologic deficits . Skin: Skin is warm, dry.  No rash or  ulcers Psychiatric: Mood and affect are appropriate     Past Medical History:  Diagnosis Date   Asthma    Atypical chest pain    neg ETT at [facility]   CKD (chronic kidney disease), stage III ([place])    Depression    Diabetes mellitus without complication ([place])    Fibromyalgia    HLD (hyperlipidemia)    Hypertension    Vitamin D deficiency     Past Surgical History:  Procedure Laterality Date   ABDOMINAL HYSTERECTOMY     COLONOSCOPY WITH PROPOFOL N/A 05/22/2016   Procedure: COLONOSCOPY WITH PROPOFOL;  Surgeon: Lollie Sails, MD;  Location[facility] ENDOSCOPY;  Service: Endoscopy;  Laterality: N/A;   ESOPHAGOGASTRODUODENOSCOPY (EGD) WITH PROPOFOL N/A 05/22/2016   Procedure: ESOPHAGOGASTRODUODENOSCOPY (EGD) WITH PROPOFOL;  Surgeon: Lollie Sails, MD;  Location: [facility] ENDOSCOPY;  Service: Endoscopy;  Laterality: N/A;   HAND SURGERY Right 2014   Dr Rudene Christians   rotator cuff surgery Left 03/2013   DR Rudene Christians   SHOULDER ARTHROSCOPY WITH OPEN ROTATOR CUFF REPAIR Right 04/25/2017   Procedure: SHOULDER ARTHROSCOPY WITH OPEN ROTATOR CUFF REPAIR;  Surgeon: Corky Mull, MD;  Location: [facility] ORS;  Service: Orthopedics;  Laterality: Right;   SHOULDER ARTHROSCOPY WITH OPEN ROTATOR CUFF REPAIR Right 03/11/2018   Procedure: SHOULDER ARTHROSCOPY WITH Recurrent  OPEN ROTATOR CUFF REPAIR;  Surgeon: Corky Mull, MD;  Location: [facility] ORS;  Service: Orthopedics;  Laterality: Right;  debridment and decompression     reports that she has never smoked. She has never used smokeless tobacco. She reports that she does not drink alcohol and does not use drugs.  Allergies  Allergen Reactions   Ace Inhibitors Other (See Comments) and Cough    Constant persistent cough   Pollen Extracts [Pollen Extract] Other (See Comments)    Sneezing/watery eyes/itchy eye   Vicodin [Hydrocodone-Acetaminophen] Nausea And Vomiting    Family History  Problem Relation Age of Onset   Diabetes Mother    Breast cancer Mother 2    Cancer Sister    Cancer Father    Liver disease Brother    Breast cancer Maternal Aunt    Breast cancer Cousin        maternal      Prior to Admission medications   Medication Sig Start Date End Date Taking? Authorizing Provider  acetaminophen (TYLENOL) 500 MG tablet Take 500-1,000 mg by mouth every 6 (six) hours as needed for mild pain or headache.     [provider]  albuterol (PROAIR HFA) 108 (90 BASE) MCG/ACT inhaler Inhale 2 puffs into the lungs every 6 (six) hours as needed for wheezing or shortness of breath.     [provider]  aspirin EC 81 MG tablet Take 81 mg by mouth daily. 03/02/20   [provider]  blood glucose meter kit and supplies KIT Dispense based on patient and insurance preference. Use up to four times daily as directed. (FOR ICD-9 250.00, 250.01). 08/11/18   Epifanio Lesches, MD  celecoxib (CELEBREX) 100 MG capsule Take 100 mg by mouth 2 (two) times daily.  06/06/20   [provider]  Cholecalciferol (VITAMIN D3) 50 MCG (2000 UT) TABS Take 2,000 Units by mouth daily.    [provider]  diclofenac sodium (VOLTAREN) 1 % GEL Apply 2 g topically 2 (two) times daily as needed. Patient taking differently: Apply 2 g topically 2 (two) times daily as needed (for pain).  09/30/17   Merlyn Lot, MD  diclofenac Sodium (VOLTAREN) 1 % GEL Apply topically. 05/09/20   [provider]  donepezil (ARICEPT) 10 MG tablet Take by mouth. 09/28/15   [provider]  ezetimibe (ZETIA) 10 MG tablet Take 10 mg by mouth daily. 06/06/20   [provider]  fluticasone furoate-vilanterol (BREO ELLIPTA) 100-25 MCG/INH AEPB Inhale into the lungs. 01/22/19   [provider]  gabapentin (NEURONTIN) 300 MG capsule Take by mouth. 04/02/16   [provider]  hydrochlorothiazide (HYDRODIURIL) 25 MG tablet Take 25 mg by mouth daily.     [provider]  Insulin Glargine (LANTUS SOLOSTAR) 100 UNIT/ML Solostar  Pen Inject 43 Units into the skin daily.    [provider]  isosorbide mononitrate (IMDUR) 30 MG 24 hr tablet Take 30 mg by mouth daily.    [provider]  loratadine (CLARITIN) 10 MG tablet Take 10 mg by mouth daily.    [provider]  losartan (COZAAR) 25 MG tablet Take 25 mg by mouth daily.    [provider]  Multiple Vitamin (MULTIVITAMIN WITH MINERALS) TABS tablet Take 1 tablet by mouth daily. 07/31/18   Salary, Avel Peace, MD  naproxen (NAPROSYN) 500 MG tablet Take 1 tablet (500 mg total) by mouth 2 (two) times daily with a meal. 08/22/18   Lavonia Drafts, MD  nortriptyline (PAMELOR) 10 MG capsule Take 30 mg by mouth at bedtime.     [provider]  omeprazole (PRILOSEC) 20 MG capsule Take 20 mg by mouth daily.     [provider]  ondansetron (ZOFRAN ODT) 4 MG disintegrating tablet Take 1 tablet (4 mg total) by mouth every 8 (eight) hours as needed for nausea or vomiting. 03/11/18   Poggi, Marshall Cork, MD  oxyCODONE (ROXICODONE) 5 MG immediate release tablet Take 1-2 tablets (5-10 mg total) by mouth every 4 (four) hours as needed. 03/11/18   Poggi, Marshall Cork, MD  polyethylene glycol (MIRALAX) 17 g packet Take 17 g by mouth daily as needed for moderate constipation. 09/11/18   Gregor Hams, MD  pravastatin (PRAVACHOL) 40 MG tablet Take by mouth. 05/29/18   [provider]  simvastatin (ZOCOR) 40 MG tablet Take by mouth. 02/07/12   [provider]  sitaGLIPtin (JANUVIA) 100 MG tablet Take 100 mg by mouth daily.    [provider]  SUMAtriptan (IMITREX) 100 MG tablet Take 100 mg by mouth every 2 (two) hours as needed for migraine. May repeat in 2 hours if headache persists or recurs.    [provider]  venlafaxine XR (EFFEXOR-XR) 150 MG 24 hr capsule Take 150 mg by mouth daily with breakfast.     [provider]  Vitamins/Minerals TABS Take by mouth.    [provider]      Labs on Admission: I  have personally reviewed following labs and imaging studies  CBC: Recent Labs  Lab 02/14/21 1956  WBC 9.7  HGB 12.3  HCT 36.4  MCV 86.5  PLT 627   Basic Metabolic Panel: Recent Labs  Lab 02/14/21 1956  NA 125*  K 5.1  CL 91*  CO2 24  GLUCOSE 407*  BUN 72*  CREATININE 3.92*  CALCIUM 9.6   GFR: Estimated Creatinine Clearance: 11 mL/min (A) (by C-G formula based on SCr of 3.92 mg/dL (H)). Liver Function Tests: Recent Labs  Lab 02/14/21 1956  AST 21  ALT 18  ALKPHOS 122  BILITOT 0.7  PROT 8.0  ALBUMIN 3.8   No results for input(s): LIPASE, AMYLASE in the last 168 hours. No results for input(s): AMMONIA in the last 168 hours. Coagulation Profile: No results for input(s): INR, PROTIME in the last 168 hours. Cardiac Enzymes: No results for input(s): CKTOTAL, CKMB, CKMBINDEX, TROPONINI in the last 168 hours. BNP (last 3 results) No results for input(s): PROBNP in the last 8760 hours. HbA1C: No results for input(s): HGBA1C in the last 72 hours. CBG: Recent Labs  Lab 02/15/21 0100  GLUCAP 314*   Lipid Profile: No results for input(s): CHOL, HDL, LDLCALC, TRIG, CHOLHDL, LDLDIRECT in the last 72 hours. Thyroid Function Tests: No results for input(s): TSH, T4TOTAL, FREET4, T3FREE, THYROIDAB in the last 72 hours. Anemia Panel: No results for input(s): VITAMINB12, FOLATE, FERRITIN, TIBC, IRON, RETICCTPCT in the last 72 hours. Urine analysis:    Component Value Date/Time   COLORURINE YELLOW (A) 05/27/2019 1759   APPEARANCEUR HAZY (A) 05/27/2019 1759   APPEARANCEUR Cloudy 07/07/2013 1810   LABSPEC 1.015 05/27/2019 1759   LABSPEC 1.018 07/07/2013 1810   PHURINE 5.0 05/27/2019 1759   GLUCOSEU NEGATIVE 05/27/2019 1759   GLUCOSEU Negative 07/07/2013 1810   HGBUR NEGATIVE 05/27/2019 1759   BILIRUBINUR NEGATIVE 05/27/2019 1759   BILIRUBINUR Negative 07/07/2013 1810   KETONESUR NEGATIVE 05/27/2019 1759   PROTEINUR >=300 (A) 05/27/2019 1759   NITRITE NEGATIVE  05/27/2019 1759   LEUKOCYTESUR SMALL (A) 05/27/2019 1759   LEUKOCYTESUR Trace 07/07/2013 1810    Radiological Exams on Admission: DG Chest 2 View  Result Date: 02/14/2021 CLINICAL DATA:  Cough EXAM: CHEST - 2 VIEW COMPARISON:  05/27/2019 FINDINGS: Linear atelectasis or scar at the left base. Patchy left lower lobe opacity, possible pneumonia. Normal cardiomediastinal silhouette. No pneumothorax. IMPRESSION: Possible patchy left lower lobe pneumonia. Radiographic follow-up to resolution is recommended. Electronically Signed   By: Donavan Foil M.D.   On: 02/14/2021 22:30       Athena Masse MD [facility]   02/15/2021, 1:49 AM

## 2021-02-16 DIAGNOSIS — E1169 Type 2 diabetes mellitus with other specified complication: Secondary | ICD-10-CM

## 2021-02-16 DIAGNOSIS — N189 Chronic kidney disease, unspecified: Secondary | ICD-10-CM

## 2021-02-16 DIAGNOSIS — N179 Acute kidney failure, unspecified: Secondary | ICD-10-CM

## 2021-02-16 LAB — CBC
HCT: 33 % — ABNORMAL LOW (ref 36.0–46.0)
Hemoglobin: 11 g/dL — ABNORMAL LOW (ref 12.0–15.0)
MCH: 28.7 pg (ref 26.0–34.0)
MCHC: 33.3 g/dL (ref 30.0–36.0)
MCV: 86.2 fL (ref 80.0–100.0)
Platelets: 258 10*3/uL (ref 150–400)
RBC: 3.83 MIL/uL — ABNORMAL LOW (ref 3.87–5.11)
RDW: 13.3 % (ref 11.5–15.5)
WBC: 8.1 10*3/uL (ref 4.0–10.5)
nRBC: 0 % (ref 0.0–0.2)

## 2021-02-16 LAB — LEGIONELLA PNEUMOPHILA SEROGP 1 UR AG: L. pneumophila Serogp 1 Ur Ag: NEGATIVE

## 2021-02-16 LAB — BASIC METABOLIC PANEL
Anion gap: 6 (ref 5–15)
BUN: 58 mg/dL — ABNORMAL HIGH (ref 8–23)
CO2: 26 mmol/L (ref 22–32)
Calcium: 8.6 mg/dL — ABNORMAL LOW (ref 8.9–10.3)
Chloride: 101 mmol/L (ref 98–111)
Creatinine, Ser: 2.49 mg/dL — ABNORMAL HIGH (ref 0.44–1.00)
GFR, Estimated: 19 mL/min — ABNORMAL LOW (ref >60)
Glucose, Bld: 197 mg/dL — ABNORMAL HIGH (ref 70–99)
Potassium: 4.9 mmol/L (ref 3.5–5.1)
Sodium: 133 mmol/L — ABNORMAL LOW (ref 135–145)

## 2021-02-16 LAB — GLUCOSE, CAPILLARY
Glucose-Capillary: 141 mg/dL — ABNORMAL HIGH (ref 70–99)
Glucose-Capillary: 143 mg/dL — ABNORMAL HIGH (ref 70–99)
Glucose-Capillary: 184 mg/dL — ABNORMAL HIGH (ref 70–99)
Glucose-Capillary: 126 mg/dL — ABNORMAL HIGH (ref 70–99)

## 2021-02-16 LAB — HEMOGLOBIN A1C
Hgb A1c MFr Bld: 12.9 % — ABNORMAL HIGH (ref 4.8–5.6)
Mean Plasma Glucose: 324 mg/dL

## 2021-02-16 MED ORDER — LIVING WELL WITH DIABETES BOOK
Freq: Once | Status: AC
  Filled 2021-02-16: qty 1

## 2021-02-16 NOTE — Evaluation (Signed)
Physical Therapy Evaluation Patient Details Name: Theresa Booker MRN: [id number] DOB: Mar 16, 1945 Today's Date: 02/16/2021  History of Present Illness  Theresa Booker is a 76 y.o. female with medical history significant for  asthma, DM, HTN, fibromyalgia and chronic pain, depression who presents to the ED with a 2-week history of cough productive of yellow phlegm, congestion, generalized malaise and dizziness.    Clinical Impression  Pt received seated in recliner upon arrival to room.  Pt agreeable to therapy.  Pt pleasantly confused throughout session and start with SpO2 of 89-90% on room air, dropping to low 80's during TherEx, so was palced on 2L of O2 and pt levels rose to >94% for the rest of the session.  Pt then transferred to standing with use of RW and was able to ambulate around nursing station with CGA.  Pt then transferred back to recliner and left with call bell in place.  All needs met and nursing notified of mobility status.  Pt will benefit from skilled PT intervention to increase independence and safety with basic mobility in preparation for discharge to the venue listed below.        Recommendations for follow up therapy are one component of a multi-disciplinary discharge planning process, led by the attending physician.  Recommendations may be updated based on patient status, additional functional criteria and insurance authorization.  Follow Up Recommendations Home health PT    Assistance Recommended at Discharge Intermittent Supervision/Assistance  Functional Status Assessment Patient has had a recent decline in their functional status and demonstrates the ability to make significant improvements in function in a reasonable and predictable amount of time.  Equipment Recommendations  Rolling walker (2 wheels)    Recommendations for Other Services       Precautions / Restrictions Precautions Precautions: Fall Restrictions Weight Bearing Restrictions: No       Mobility  Bed Mobility               General bed mobility comments: pt upright in recliner upon arrival and lef tin recliner at conclusion    Transfers Overall transfer level: Needs assistance Equipment used: Rolling walker (2 wheels) Transfers: Sit to/from Stand Sit to Stand: Min guard                Ambulation/Gait Ambulation/Gait assistance: Min guard Gait Distance (Feet): 180 Feet Assistive device: Rolling walker (2 wheels) Gait Pattern/deviations: WFL(Within Functional Limits) Gait velocity: decreased     General Gait Details: Pt with decreased speed when ambulating, but safe in doing so.  Pt does appear to be confused throughout session, with an inability to navigate hallway and remember where room is.  Pt does ambulate well, however.  Stairs            Wheelchair Mobility    Modified Rankin (Stroke Patients Only)       Balance Overall balance assessment: Needs assistance;History of Falls Sitting-balance support: No upper extremity supported;Feet unsupported Sitting balance-Leahy Scale: Good     Standing balance support: Single extremity supported Standing balance-Leahy Scale: Fair                               Pertinent Vitals/Pain Pain Assessment: No/denies pain    Home Living Family/patient expects to be discharged to:: Private residence Living Arrangements: Alone;Other (Comment) (pt reports grandson visits and grandson lives with her- unsure which is accurate) Available Help at Discharge: Family Type of Home: House Home Access:  Stairs to enter Entrance Stairs-Rails: None Entrance Stairs-Number of Steps: 2   Home Layout: One level   Additional Comments: Patient is pleasantly confused.    Prior Function Prior Level of Function : Needs assist  Cognitive Assist : Mobility (cognitive)             ADLs Comments: Pt reports she can mostly dress herself but LBD makes her SOB, showering is difficult and she can make  meals a little bit.     Hand Dominance        Extremity/Trunk Assessment   Upper Extremity Assessment Upper Extremity Assessment: Overall WFL for tasks assessed    Lower Extremity Assessment Lower Extremity Assessment: Generalized weakness       Communication   Communication: No difficulties  Cognition Arousal/Alertness: Awake/alert Behavior During Therapy: WFL for tasks assessed/performed Overall Cognitive Status: Within Functional Limits for tasks assessed                                          General Comments General comments (skin integrity, edema, etc.): On room air, pt hovering at 89-90%.  Pt on 2L of O2 and able to achieve >94% throughout session with ambulation.    Exercises Total Joint Exercises Ankle Circles/Pumps: AROM;Strengthening;Both;10 reps;Seated Gluteal Sets: AROM;Strengthening;Both;10 reps;Seated Long Arc Quad: AROM;Strengthening;Both;10 reps;Seated Marching in Standing: AROM;Strengthening;Both;10 reps;Standing Other Exercises Other Exercises: Pt educ re: OT role, falls prevention, DME use, d/c recs Other Exercises: Sit<>stand, monitor O2 throughout, LBD, toiltet t/f, BP monitor, functional mobility to chair   Assessment/Plan    PT Assessment Patient needs continued PT services  PT Problem List Decreased strength;Decreased activity tolerance;Decreased mobility       PT Treatment Interventions DME instruction;Gait training;Stair training;Functional mobility training;Therapeutic activities;Therapeutic exercise;Balance training;Neuromuscular re-education    PT Goals (Current goals can be found in the Care Plan section)  Acute Rehab PT Goals Patient Stated Goal: to get stronger PT Goal Formulation: With patient Time For Goal Achievement: 03/02/21 Potential to Achieve Goals: Good    Frequency Min 2X/week   Barriers to discharge        Co-evaluation               AM-PAC PT "6 Clicks" Mobility  Outcome Measure Help  needed turning from your back to your side while in a flat bed without using bedrails?: A Little Help needed moving from lying on your back to sitting on the side of a flat bed without using bedrails?: A Little Help needed moving to and from a bed to a chair (including a wheelchair)?: A Little Help needed standing up from a chair using your arms (e.g., wheelchair or bedside chair)?: A Little Help needed to walk in hospital room?: A Little Help needed climbing 3-5 steps with a railing? : A Lot 6 Click Score: 17    End of Session Equipment Utilized During Treatment: Gait belt Activity Tolerance: Patient tolerated treatment well Patient left: in chair;with call bell/phone within reach;with chair alarm set Nurse Communication: Mobility status PT Visit Diagnosis: Unsteadiness on feet (R26.81);Other abnormalities of gait and mobility (R26.89);Muscle weakness (generalized) (M62.81);History of falling (Z91.81);Difficulty in walking, not elsewhere classified (R26.2)    Time: 7062-3762 PT Time Calculation (min) (ACUTE ONLY): 44 min   Charges:   PT Evaluation $PT Eval Low Complexity: 1 Low PT Treatments $Gait Training: 8-22 mins $Therapeutic Exercise: 8-22 mins  Gwenlyn Saran, PT, DPT 02/16/21, 1:08 PM   Christie Nottingham 02/16/2021, 12:59 PM

## 2021-02-16 NOTE — TOC Initial Note (Signed)
Transition of Care [facility]) - Initial/Assessment Note    Patient Details  Name: Theresa Booker MRN: [id number] Date of Birth: [birth year]  [facility]) CM/SW Contact:    Beverly Sessions, RN Phone Number: 02/16/2021, 4:17 PM  Clinical Narrative:                 Admitted for:PNA Admitted from:Home with grandson. Attempted to call Grandson and granddaughter in [employer].  His VM was not set up, and hers was full [facility]  Current home health/prior home health/DME:  MD to order RW, BSC, and shower chair. And home health PT and OT  Patient agreeable to home health. States she doesn't have a preference of home health agency.  Referral made to [facility] with [place]    Expected Discharge Plan: [place] Barriers to Discharge: Continued Medical Work up   Patient Goals and CMS Choice        Expected Discharge Plan and Services Expected Discharge Plan: [place]   Discharge Planning Services: CM Consult Post Acute Care Choice: [place] arrangements for the past 2 months: Single Family Home                 DME Arranged: 3-N-1, Walker rolling, Shower stool DME Agency: [employer]       HH Arranged: PT, OT HH Agency: [facility] Date HH Agency Contacted: 02/16/21   Representative spoke with at [place]: Corene Cornea  Prior Living Arrangements/Services Living arrangements for the past 2 months: Single Family Home Lives with:: Adult Children Patient language and need for interpreter reviewed:: Yes Do you feel safe going back to the place where you live?: Yes      Need for Family Participation in Patient Care: Yes (Comment) Care giver support system in place?: Yes (comment) Current home services: Home PT, Home OT Criminal Activity/Legal Involvement Pertinent to Current Situation/Hospitalization: No - Comment as needed  Activities of Daily Living      Permission Sought/Granted                   Emotional Assessment       Orientation: : Oriented to Self, Oriented to Place Alcohol / Substance Use: Not Applicable Psych Involvement: No (comment)  Admission diagnosis:  Left lower lobe pneumonia [J18.9] AKI (acute kidney injury) ([place]) [N17.9] Community acquired pneumonia of left lower lobe of lung [J18.9] Patient Active Problem List   Diagnosis Date Noted   Left lower lobe pneumonia 02/15/2021   Acute kidney injury superimposed on CKD lllb ([place]) 02/15/2021   Hyperglycemia due to type 2 diabetes mellitus ([place]) 02/15/2021   Diabetes ([place]) 06/09/2020   SDH (subdural hematoma) 06/05/2019   Sepsis ([place]) 08/07/2018   AMS (altered mental status) 07/30/2018   Altered mental status 07/29/2018   Rotator cuff tendinitis, right 04/01/2017   Traumatic complete tear of right rotator cuff 04/01/2017   Dysphagia, unspecified 09/28/2015   Cocaine use 05/03/2015   Abnormal MRI, lumbar spine 05/03/2015   Numbness in feet 04/29/2015   Gait instability 04/29/2015   BP (high blood pressure) 04/13/2015   Fibromyalgia 04/13/2015   Essential (primary) hypertension 04/13/2015   Clinical depression 04/13/2015   Chronic kidney disease (CKD), stage III (moderate) ([place]) 04/13/2015   Airway hyperreactivity 04/13/2015   Chronic knee pain (Right) 04/13/2015   Chronic low back pain (Location of Secondary source of pain) (Bilateral) (R>L) 04/13/2015   Chronic lower straight pain (Location of  Primary Source of Pain) (Right) 04/13/2015   Chronic lumbar radicular pain (Right) (L5 Dermatome) 04/13/2015   At high risk for falls 04/13/2015   Chronic hip pain (Right) 04/13/2015   Chronic sacroiliac joint pain (Bilateral) (R>L) 04/13/2015   Controlled type 2 diabetes mellitus without complication ([place]) 67/42/5525   Headache disorder 02/01/2015   Dizziness 02/01/2015   Bad posture 02/01/2015   Episodic tension type headache 01/10/2015   Amnesia 10/18/2014   Combined fat and carbohydrate  induced hyperlipemia 09/24/2013   Acid reflux 09/24/2013   Chronic pain 09/24/2013   Avitaminosis D 02/11/2013   PCP:  [facility], [place]:   [facility] [place], [place] [address] [place] [address] [place] [address] Phone: [phone] Fax: [facility] [address]), [place] - [place]) [place] [address] Phone: [phone] Fax: [phone]     Social Determinants of Health (SDOH) Interventions    Readmission Risk Interventions Readmission Risk Prevention Plan 08/09/2018  Transportation Screening Complete  Medication Review (RN Care Manager) Complete  HRI or Home Care Consult Complete  Some recent data might be hidden

## 2021-02-16 NOTE — Progress Notes (Signed)
PROGRESS NOTE    Theresa Booker  VOZ:[id number] DOB: 02-22-1946 DOA: 02/15/2021 PCP: [facility]   Assessment & Plan:   Principal Problem:   Left lower lobe pneumonia Active Problems:   Essential (primary) hypertension   Chronic pain   Acute kidney injury superimposed on CKD lllb ([place])   Hyperglycemia due to type 2 diabetes mellitus ([place])   Left lower lobe pneumonia: continue on IV azithromycin, rocephin & bronchodilators. Continue on supplemental oxygen and wean as tolerated   Acute hypoxic respiratory failure: dropped to low 80s on RA when working with PT today. Likely secondary to above. Continue on supplemental oxygen and wean as tolerated   AKI on CKDIIIb: Cr is trending down from day prior. Avoid nephrotoxic meds    DM2: poorly controlled, HbA1c 12.9. Continue on glargine, SSI w/ accuchecks    HTN: continue to hold losartan, HCTZ secondary to low normal BP    Chronic pain: continue on norco  DVT prophylaxis: lovenox  Code Status: full  Family Communication:  discussed pt's care w/ pt's son, Kathyrn Drown & answered his questions  Disposition Plan: likely d/c back home   Level of care: Med-Surg  Status is: Inpatient  Remains inpatient appropriate because: severity of illness, requiring IV abxs    Consultants:    Procedures:   Antimicrobials: rocephin, azithromycin    Subjective: Pt c/o malaise   Objective: Vitals:   02/15/21 1609 02/15/21 2012 02/16/21 0431 02/16/21 0751  BP: 106/67 115/86 107/65 98/69  Pulse: 72 76 69 64  Resp: 18 18 18 18   Temp: 97.7 F (36.5 C) (!) 97.5 F (36.4 C) (!) 97.5 F (36.4 C) 97.8 F (36.6 C)  TempSrc: Oral Oral Oral Oral  SpO2: 98% 100% 100% 95%  Weight:      Height:        Intake/Output Summary (Last 24 hours) at 02/16/2021 0813 Last data filed at 02/15/2021 1800 Gross per 24 hour  Intake 1505.82 ml  Output --  Net 1505.82 ml   Filed Weights   02/14/21 2011  Weight: 77.1 kg     Examination:  General exam: appears comfortable  Respiratory system: decreased breath sounds. No rales  Cardiovascular system: S1/S2+. No rubs or clicks   Gastrointestinal system: Abd is soft, NT, obese & hypoactive bowel sounds  Central nervous system: Alert and awake. Moves all extremities  Psychiatry: Judgement and insight appears poor. Flat mood and affect     Data Reviewed: I have personally reviewed following labs and imaging studies  CBC: Recent Labs  Lab 02/14/21 1956 02/15/21 0802 02/16/21 0234  WBC 9.7 9.2 8.1  HGB 12.3 11.2* 11.0*  HCT 36.4 33.0* 33.0*  MCV 86.5 84.4 86.2  PLT 281 281 425   Basic Metabolic Panel: Recent Labs  Lab 02/14/21 1956 02/15/21 0802 02/16/21 0234  NA 125* 131* 133*  K 5.1 4.0 4.9  CL 91* 100 101  CO2 24 25 26   GLUCOSE 407* 189* 197*  BUN 72* 67* 58*  CREATININE 3.92* 3.33*   3.45* 2.49*  CALCIUM 9.6 8.9 8.6*   GFR: Estimated Creatinine Clearance: 17.4 mL/min (A) (by C-G formula based on SCr of 2.49 mg/dL (H)). Liver Function Tests: Recent Labs  Lab 02/14/21 1956  AST 21  ALT 18  ALKPHOS 122  BILITOT 0.7  PROT 8.0  ALBUMIN 3.8   No results for input(s): LIPASE, AMYLASE in the last 168 hours. No results for input(s): AMMONIA in the last 168 hours. Coagulation Profile: No results  for input(s): INR, PROTIME in the last 168 hours. Cardiac Enzymes: No results for input(s): CKTOTAL, CKMB, CKMBINDEX, TROPONINI in the last 168 hours. BNP (last 3 results) No results for input(s): PROBNP in the last 8760 hours. HbA1C: Recent Labs    02/15/21 0802  HGBA1C 12.9*   CBG: Recent Labs  Lab 02/15/21 0744 02/15/21 1130 02/15/21 1641 02/15/21 2111 02/16/21 0752  GLUCAP 186* 131* 208* 245* 141*   Lipid Profile: No results for input(s): CHOL, HDL, LDLCALC, TRIG, CHOLHDL, LDLDIRECT in the last 72 hours. Thyroid Function Tests: No results for input(s): TSH, T4TOTAL, FREET4, T3FREE, THYROIDAB in the last 72  hours. Anemia Panel: No results for input(s): VITAMINB12, FOLATE, FERRITIN, TIBC, IRON, RETICCTPCT in the last 72 hours. Sepsis Labs: Recent Labs  Lab 02/15/21 0144 02/15/21 0802  LATICACIDVEN 1.6 1.2    Recent Results (from the past 240 hour(s))  Resp Panel by RT-PCR (Flu A&B, Covid) Nasopharyngeal Swab     Status: None   Collection Time: 02/14/21  7:56 PM   Specimen: Nasopharyngeal Swab; Nasopharyngeal(NP) swabs in vial transport medium  Result Value Ref Range Status   SARS Coronavirus 2 by RT PCR NEGATIVE NEGATIVE Final    Comment: (NOTE) SARS-CoV-2 target nucleic acids are NOT DETECTED.  The SARS-CoV-2 RNA is generally detectable in upper respiratory specimens during the acute phase of infection. The lowest concentration of SARS-CoV-2 viral copies this assay can detect is 138 copies/mL. A negative result does not preclude SARS-Cov-2 infection and should not be used as the sole basis for treatment or other patient management decisions. A negative result may occur with  improper specimen collection/handling, submission of specimen other than nasopharyngeal swab, presence of viral mutation(s) within the areas targeted by this assay, and inadequate number of viral copies(<138 copies/mL). A negative result must be combined with clinical observations, patient history, and epidemiological information. The expected result is Negative.  Fact Sheet for Patients:  EntrepreneurPulse.com.au  Fact Sheet for Healthcare Providers:  IncredibleEmployment.be  This test is no t yet approved or cleared by the [place] FDA and  has been authorized for detection and/or diagnosis of SARS-CoV-2 by FDA under an Emergency Use Authorization (EUA). This EUA will remain  in effect (meaning this test can be used) for the duration of the COVID-19 declaration under Section 564(b)(1) of the Act, 21 U.S.C.section 360bbb-3(b)(1), unless the authorization is  terminated  or revoked sooner.       Influenza A by PCR NEGATIVE NEGATIVE Final   Influenza B by PCR NEGATIVE NEGATIVE Final    Comment: (NOTE) The Xpert Xpress SARS-CoV-2/FLU/RSV plus assay is intended as an aid in the diagnosis of influenza from Nasopharyngeal swab specimens and should not be used as a sole basis for treatment. Nasal washings and aspirates are unacceptable for Xpert Xpress SARS-CoV-2/FLU/RSV testing.  Fact Sheet for Patients: EntrepreneurPulse.com.au  Fact Sheet for Healthcare Providers: IncredibleEmployment.be  This test is not yet approved or cleared by the [place] FDA and has been authorized for detection and/or diagnosis of SARS-CoV-2 by FDA under an Emergency Use Authorization (EUA). This EUA will remain in effect (meaning this test can be used) for the duration of the COVID-19 declaration under Section 564(b)(1) of the Act, 21 U.S.C. section 360bbb-3(b)(1), unless the authorization is terminated or revoked.  Performed at [facility], [address] [place], [place] [address]   Blood culture (routine x 2)     Status: None (Preliminary result)   Collection Time: 02/15/21  1:44 AM  Specimen: BLOOD  Result Value Ref Range Status   Specimen Description BLOOD RIGHT ASSIST CONTROL  Final   Special Requests   Final    BOTTLES DRAWN AEROBIC AND ANAEROBIC Blood Culture results may not be optimal due to an excessive volume of blood received in culture bottles   Culture   Final    NO GROWTH 1 DAY Performed at [facility], [address], [place], [place] [address]    Report Status PENDING  Incomplete  Blood culture (routine x 2)     Status: None (Preliminary result)   Collection Time: 02/15/21  1:44 AM   Specimen: BLOOD  Result Value Ref Range Status   Specimen Description BLOOD RIGHT ASSIST CONTROL  Final   Special Requests   Final    BOTTLES DRAWN AEROBIC AND ANAEROBIC Blood Culture  adequate volume   Culture   Final    NO GROWTH 1 DAY Performed at [facility], [address] [place], [place] [address]    Report Status PENDING  Incomplete         Radiology Studies: DG Chest 2 View  Result Date: 02/14/2021 CLINICAL DATA:  Cough EXAM: CHEST - 2 VIEW COMPARISON:  05/27/2019 FINDINGS: Linear atelectasis or scar at the left base. Patchy left lower lobe opacity, possible pneumonia. Normal cardiomediastinal silhouette. No pneumothorax. IMPRESSION: Possible patchy left lower lobe pneumonia. Radiographic follow-up to resolution is recommended. Electronically Signed   By: Donavan Foil M.D.   On: 02/14/2021 22:30        Scheduled Meds:  enoxaparin (LOVENOX) injection  30 mg Subcutaneous Q24H   insulin aspart  0-15 Units Subcutaneous TID WC   insulin aspart  0-5 Units Subcutaneous QHS   insulin glargine-yfgn  20 Units Subcutaneous QHS   Continuous Infusions:  sodium chloride 75 mL/hr at 02/15/21 1533   azithromycin 500 mg (02/15/21 2303)   cefTRIAXone (ROCEPHIN)  IV 2 g (02/16/21 0516)     LOS: 1 day    Time spent: 30 mins    Wyvonnia Dusky, MD [facility] Pager [phone]  If 7PM-7AM, please contact night-coverage  02/16/2021, 8:13 AM

## 2021-02-16 NOTE — Evaluation (Signed)
Occupational Therapy Evaluation Patient Details Name: Theresa Booker MRN: [id number] DOB: 11-Jan-1946 Today's Date: 02/16/2021   History of Present Illness Theresa Booker is a 75 y.o. female with medical history significant for  asthma, DM, HTN, fibromyalgia and chronic pain, depression who presents to the ED with a 2-week history of cough productive of yellow phlegm, congestion, generalized malaise and dizziness.   Clinical Impression   Theresa Booker was seen for OT evaluation this date. Prior to hospital admission, pt needed assistance with ADLs. Pt reports that a grandson lives with her. Currently pt demonstrates impairments as described below (See OT problem list) which functionally limit her ability to perform ADL/self-care tasks. Pt currently requires CGA + VCs for functional mobility, STS ~ 2 min, pt limited by dizziness, SOB. Pt MIN A for LBD, able to access feet, but exertion causes pt to desat. Pt CGA + RW for toilet t/f, ambulating to [facility], limited by dizziness. Pt SETUP + SUP for perihygiene. On RA desat noted to 31 with LBD and 87 with stand. Pt orthostatic upon stand Sit - 117/90 (MAP 99 ), Stand - 89/70 (MAP 78)- RN notified. Resolved in sitting. Pt would benefit from skilled OT services to address noted impairments and functional limitations (see below for any additional details) in order to maximize safety and independence while minimizing falls risk and caregiver burden. Upon hospital discharge, recommend HHOT to maximize pt safety and return to functional independence during meaningful occupations of daily life.   Sitting- BP 117/90 (MAP 99 ) HR 98 Standing- BP 89/70 (MAP 78) HR  78 - dizziness   Recommendations for follow up therapy are one component of a multi-disciplinary discharge planning process, led by the attending physician.  Recommendations may be updated based on patient status, additional functional criteria and insurance authorization.   Follow Up Recommendations  Home  health OT    Assistance Recommended at Discharge Intermittent Supervision/Assistance  Functional Status Assessment  Patient has had a recent decline in their functional status and demonstrates the ability to make significant improvements in function in a reasonable and predictable amount of time.  Equipment Recommendations  BSC/3in1;Tub/shower seat    Recommendations for Other Services       Precautions / Restrictions Precautions Precautions: Fall Restrictions Weight Bearing Restrictions: No      Mobility Bed Mobility               General bed mobility comments: Pt received sitting EOB and left in chair.    Transfers Overall transfer level: Needs assistance Equipment used: Rolling walker (2 wheels) Transfers: Sit to/from Stand Sit to Stand: Min assist                  Balance Overall balance assessment: Needs assistance;History of Falls Sitting-balance support: No upper extremity supported;Feet unsupported Sitting balance-Leahy Scale: Good     Standing balance support: Single extremity supported Standing balance-Leahy Scale: Fair                             ADL either performed or assessed with clinical judgement   ADL Overall ADL's : Needs assistance/impaired                                       General ADL Comments: Pt CGA + VCs for functional mobility, STS ~ 2 min, pt limited by dizziness,  SOB. Pt MIN A for LBD, able to access feet, but exertion causes pt to desat. Pt CGA + RW for toilet t/f, ambulating, limited by dizziness. Pt SETUP + SUP for perihygiene.      Pertinent Vitals/Pain Pain Assessment: No/denies pain     Hand Dominance     Extremity/Trunk Assessment Upper Extremity Assessment Upper Extremity Assessment: Overall WFL for tasks assessed   Lower Extremity Assessment Lower Extremity Assessment: Overall WFL for tasks assessed       Communication Communication Communication: No difficulties    Cognition Arousal/Alertness: Awake/alert Behavior During Therapy: WFL for tasks assessed/performed Overall Cognitive Status: Within Functional Limits for tasks assessed                                       General Comments  On RA desat noted to 42 with LBD and 87 with stand. Pt othostatic upon stand Sit - 117/90 (MAP 99 ), Stand - 89/70 (MAP 78).    Exercises Exercises: Other exercises Other Exercises Other Exercises: Pt educ re: OT role, falls prevention, DME use, d/c recs Other Exercises: Sit<>stand, monitor O2 throughout, LBD, toiltet t/f, BP monitor, functional mobility to chair   Shoulder Instructions      Home Living Family/patient expects to be discharged to:: Private residence Living Arrangements: Alone;Other (Comment) (pt reports grandson visits and grandson lives with her- unsure which is accurate) Available Help at Discharge: Family Type of Home: House Home Access: Stairs to enter Technical brewer of Steps: 2 Entrance Stairs-Rails: None Home Layout: One level     Bathroom Shower/Tub: Teacher, early years/pre: Standard                Prior Functioning/Environment Prior Level of Function : Needs assist  Cognitive Assist : Mobility (cognitive)             ADLs Comments: Pt reports she can mostly dress herself but LBD makes her SOB, showering is difficult and she can make meals a little bit.        OT Problem List: Decreased range of motion;Decreased activity tolerance;Impaired balance (sitting and/or standing);Decreased safety awareness;Decreased knowledge of use of DME or AE;Decreased knowledge of precautions      OT Treatment/Interventions: Self-care/ADL training;Therapeutic exercise;Energy conservation;DME and/or AE instruction;Therapeutic activities    OT Goals(Current goals can be found in the care plan section) Acute Rehab OT Goals Patient Stated Goal: to go home OT Goal Formulation: With patient Time For Goal  Achievement: 03/02/21 Potential to Achieve Goals: Good ADL Goals Pt Will Perform Grooming: standing;with supervision (standing > 5 min) Pt Will Perform Lower Body Dressing: with modified independence;sit to/from stand Pt Will Transfer to Toilet: regular height toilet;ambulating;with supervision  OT Frequency: Min 2X/week   Barriers to D/C:            Co-evaluation              AM-PAC OT "6 Clicks" Daily Activity     Outcome Measure                 End of Session Equipment Utilized During Treatment: Gait belt;Rolling walker (2 wheels) Nurse Communication: Other (comment) (notified of desat, orthostatic)  Activity Tolerance: Patient tolerated treatment well;Other (comment) (sigificant dizziness) Patient left: in chair;with call bell/phone within reach;with chair alarm set  OT Visit Diagnosis: Unsteadiness on feet (R26.81);Other abnormalities of gait and mobility (R26.89);Repeated falls (R29.6)  Time: 0383-3383 OT Time Calculation (min): 55 min Charges:     Theresa Booker, Markus Daft 02/16/2021, 11:48 AM

## 2021-02-16 NOTE — Progress Notes (Signed)
Inpatient Diabetes Program Recommendations  AACE/ADA: New Consensus Statement on Inpatient Glycemic Control (2015)  Target Ranges:  Prepandial:   less than 140 mg/dL      Peak postprandial:   less than 180 mg/dL (1-2 hours)      Critically ill patients:  140 - 180 mg/dL   Lab Results  Component Value Date   GLUCAP 143 (H) 02/16/2021   HGBA1C 12.9 (H) 02/15/2021    Review of Glycemic Control  Latest Reference Range & Units 02/15/21 16:41 02/15/21 21:11 02/16/21 07:52 02/16/21 11:30  Glucose-Capillary 70 - 99 mg/dL 208 (H) 245 (H) 141 (H) 143 (H)  (H): Data is abnormally high Diabetes history: DM 2 Outpatient Diabetes medications:  Lantus 40 units daily, Januvia 100 mg daily Current orders for Inpatient glycemic control:  Novolog moderate tid with meals and HS Levemir 20 units daily  Inpatient Diabetes Program Recommendations:    Spoke with patient regarding A1C.  She states she has not been taking insulin the past few weeks b/c she did not have any pen needles.  States that she called MD to get them ordered but that they never came.  I also asked if she was checking blood sugars and she states "no, I need a meter".  Reinforced importance of taking insulin and checking blood sugars to prevent short and long term complications.  Blood sugars are much improved today- may even need a reduction in home dose of Levemir to prevent low since blood sugars look so much better with only 1/2 of home dose.  Also please re-order patient glucose meter.  Ordered LWWD booklet for patient as well.   Thanks,  Adah Perl, RN, BC-ADM Inpatient Diabetes Coordinator Pager [phone]  (8a-5p)

## 2021-02-16 NOTE — Plan of Care (Signed)
°  Problem: Education: Goal: Knowledge of General Education information will improve Description: Including pain rating scale, medication(s)/side effects and non-pharmacologic comfort measures Outcome: Progressing   Problem: Health Behavior/Discharge Planning: Goal: Ability to manage health-related needs will improve Outcome: Progressing   Problem: Clinical Measurements: Goal: Ability to maintain clinical measurements within normal limits will improve Outcome: Progressing Goal: Will remain free from infection Outcome: Progressing Goal: Respiratory complications will improve Outcome: Progressing   Problem: Activity: Goal: Risk for activity intolerance will decrease Outcome: Progressing   Problem: Nutrition: Goal: Adequate nutrition will be maintained Outcome: Progressing   Problem: Elimination: Goal: Will not experience complications related to urinary retention Outcome: Progressing   Problem: Pain Managment: Goal: General experience of comfort will improve Outcome: Progressing   Problem: Safety: Goal: Ability to remain free from injury will improve Outcome: Progressing   Problem: Skin Integrity: Goal: Risk for impaired skin integrity will decrease Outcome: Progressing

## 2021-02-17 DIAGNOSIS — J9601 Acute respiratory failure with hypoxia: Secondary | ICD-10-CM

## 2021-02-17 LAB — BASIC METABOLIC PANEL
Anion gap: 4 — ABNORMAL LOW (ref 5–15)
BUN: 34 mg/dL — ABNORMAL HIGH (ref 8–23)
CO2: 25 mmol/L (ref 22–32)
Calcium: 8.6 mg/dL — ABNORMAL LOW (ref 8.9–10.3)
Chloride: 107 mmol/L (ref 98–111)
Creatinine, Ser: 1.76 mg/dL — ABNORMAL HIGH (ref 0.44–1.00)
GFR, Estimated: 29 mL/min — ABNORMAL LOW (ref >60)
Glucose, Bld: 84 mg/dL (ref 70–99)
Potassium: 4.1 mmol/L (ref 3.5–5.1)
Sodium: 136 mmol/L (ref 135–145)

## 2021-02-17 LAB — CBC
HCT: 32.6 % — ABNORMAL LOW (ref 36.0–46.0)
Hemoglobin: 11 g/dL — ABNORMAL LOW (ref 12.0–15.0)
MCH: 29.5 pg (ref 26.0–34.0)
MCHC: 33.7 g/dL (ref 30.0–36.0)
MCV: 87.4 fL (ref 80.0–100.0)
Platelets: 252 10*3/uL (ref 150–400)
RBC: 3.73 MIL/uL — ABNORMAL LOW (ref 3.87–5.11)
RDW: 13.2 % (ref 11.5–15.5)
WBC: 9.1 10*3/uL (ref 4.0–10.5)
nRBC: 0 % (ref 0.0–0.2)

## 2021-02-17 LAB — GLUCOSE, CAPILLARY
Glucose-Capillary: 77 mg/dL (ref 70–99)
Glucose-Capillary: 180 mg/dL — ABNORMAL HIGH (ref 70–99)
Glucose-Capillary: 101 mg/dL — ABNORMAL HIGH (ref 70–99)
Glucose-Capillary: 188 mg/dL — ABNORMAL HIGH (ref 70–99)

## 2021-02-17 NOTE — TOC Progression Note (Signed)
Transition of Care [facility]) - Progression Note    Patient Details  Name: Theresa Booker MRN: [id number] Date of Birth: 1946/01/20  Transition of Care [facility]) CM/SW [place], LCSW Phone Number: 02/17/2021, 12:49 PM  Clinical Narrative:   Per MD, likely discharge tomorrow. Ordered RW, BSC, and shower stool through [facility]. Asked MD to add RN to home health order.  Expected Discharge Plan: [place] Barriers to Discharge: Continued Medical Work up  Expected Discharge Plan and Services Expected Discharge Plan: [place]   Discharge Planning Services: CM Consult Post Acute Care Choice: [place] arrangements for the past 2 months: Single Family Home                 DME Arranged: 3-N-1, Walker rolling, Shower stool DME Agency: [facility]       HH Arranged: PT, OT [place] Agency: [place] ([facility]) Date HH Agency Contacted: 02/16/21   Representative spoke with at [place]: [place] ([place]) Interventions    Readmission Risk Interventions Readmission Risk Prevention Plan 08/09/2018  Transportation Screening Complete  Medication Review Press photographer) Complete  HRI or Home Care Consult Complete  Some recent data might be hidden

## 2021-02-17 NOTE — Care Management Important Message (Signed)
Important Message  Patient Details  Name: Theresa Booker MRN: [id number] Date of Birth: June 05, 1945   Medicare Important Message Given:  Yes     Dannette Barbara 02/17/2021, 11:28 AM

## 2021-02-17 NOTE — Progress Notes (Signed)
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 Encourage incentive spirometry   Acute hypoxic respiratory failure: dropped to low 80s on RA when working with PT. Likely secondary to above. Continue on supplemental oxygen and wean as tolerated. Encourage incentive spirometry   AKI on CKDIIIb: Cr continues to trend down daily. Avoid nephrotoxic meds    DM2: HbA1c 12.9, poorly controlled. Continue on glargine, SSI w/ accuchecks    HTN: continue to hold losartan, HCTZ secondary to low normal BP    Chronic pain: continue on norco   DVT prophylaxis: lovenox  Code Status: full  Family Communication:  discussed pt's care w/ pt's son, Kathyrn Drown & answered his questions  Disposition Plan: likely d/c back home   Level of care: Med-Surg  Status is: Inpatient  Remains inpatient appropriate because: severity of illness, requiring IV abxs    Consultants:    Procedures:   Antimicrobials: rocephin, azithromycin    Subjective: Pt c/o malaise   Objective: Vitals:   02/16/21 1656 02/16/21 1943 02/17/21 0448 02/17/21 0736  BP: 125/85 109/77 111/67 120/80  Pulse: 72 76 72 70  Resp: 16 20 16 16   Temp: (!) 97.4 F (36.3 C) 97.6 F (36.4 C) 97.9 F (36.6 C) 97.8 F (36.6 C)  TempSrc: Oral Oral Oral Oral  SpO2: 100% 99% 100% 100%  Weight:      Height:        Intake/Output Summary (Last 24 hours) at 02/17/2021 0807 Last data filed at 02/17/2021 0500 Gross per 24 hour  Intake 2396.72 ml  Output 400 ml  Net 1996.72 ml   Filed Weights   02/14/21 2011  Weight: 77.1 kg     Examination:  General exam: Appears calm & comfortable  Respiratory system: diminished breath sounds b/l   Cardiovascular system: S1 & S2+. No rubs or gallops    Gastrointestinal system: Abd is soft, NT, obese & normal bowel sounds  Central nervous system: Alert and oriented. Moves all extremities  Psychiatry: judgement and insight appears poor. Flat mood and affect     Data Reviewed: I have personally reviewed following labs and imaging studies  CBC: Recent Labs  Lab 02/14/21 1956 02/15/21 0802 02/16/21 0234 02/17/21 0539  WBC 9.7 9.2 8.1 9.1  HGB 12.3 11.2* 11.0* 11.0*  HCT 36.4 33.0* 33.0* 32.6*  MCV 86.5 84.4 86.2 87.4  PLT 281 281 258 878   Basic Metabolic Panel: Recent Labs  Lab 02/14/21 1956 02/15/21 0802 02/16/21 0234 02/17/21 0539  NA 125* 131* 133* 136  K 5.1 4.0 4.9 4.1  CL 91* 100 101 107  CO2 24 25 26 25   GLUCOSE 407* 189* 197* 84  BUN 72* 67* 58* 34*  CREATININE 3.92* 3.33*   3.45* 2.49* 1.76*  CALCIUM 9.6 8.9 8.6* 8.6*   GFR: Estimated Creatinine Clearance: 24.6 mL/min (A) (by C-G formula based on SCr of 1.76 mg/dL (H)). Liver Function Tests: Recent Labs  Lab 02/14/21 1956  AST 21  ALT 18  ALKPHOS 122  BILITOT 0.7  PROT 8.0  ALBUMIN 3.8   No results for input(s): LIPASE,  AMYLASE in the last 168 hours. No results for input(s): AMMONIA in the last 168 hours. Coagulation Profile: No results for input(s): INR, PROTIME in the last 168 hours. Cardiac Enzymes: No results for input(s): CKTOTAL, CKMB, CKMBINDEX, TROPONINI in the last 168 hours. BNP (last 3 results) No results for input(s): PROBNP in the last 8760 hours. HbA1C: Recent Labs    02/15/21 0802  HGBA1C 12.9*   CBG: Recent Labs  Lab 02/16/21 0752 02/16/21 1130 02/16/21 1653 02/16/21 2120 02/17/21 0734  GLUCAP 141* 143* 184* 126* 77   Lipid Profile: No results for input(s): CHOL, HDL, LDLCALC, TRIG, CHOLHDL, LDLDIRECT in the last 72 hours. Thyroid Function  Tests: No results for input(s): TSH, T4TOTAL, FREET4, T3FREE, THYROIDAB in the last 72 hours. Anemia Panel: No results for input(s): VITAMINB12, FOLATE, FERRITIN, TIBC, IRON, RETICCTPCT in the last 72 hours. Sepsis Labs: Recent Labs  Lab 02/15/21 0144 02/15/21 0802  LATICACIDVEN 1.6 1.2    Recent Results (from the past 240 hour(s))  Resp Panel by RT-PCR (Flu A&B, Covid) Nasopharyngeal Swab     Status: None   Collection Time: 02/14/21  7:56 PM   Specimen: Nasopharyngeal Swab; Nasopharyngeal(NP) swabs in vial transport medium  Result Value Ref Range Status   SARS Coronavirus 2 by RT PCR NEGATIVE NEGATIVE Final    Comment: (NOTE) SARS-CoV-2 target nucleic acids are NOT DETECTED.  The SARS-CoV-2 RNA is generally detectable in upper respiratory specimens during the acute phase of infection. The lowest concentration of SARS-CoV-2 viral copies this assay can detect is 138 copies/mL. A negative result does not preclude SARS-Cov-2 infection and should not be used as the sole basis for treatment or other patient management decisions. A negative result may occur with  improper specimen collection/handling, submission of specimen other than nasopharyngeal swab, presence of viral mutation(s) within the areas targeted by this assay, and inadequate number of viral copies(<138 copies/mL). A negative result must be combined with clinical observations, patient history, and epidemiological information. The expected result is Negative.  Fact Sheet for Patients:  EntrepreneurPulse.com.au  Fact Sheet for Healthcare Providers:  IncredibleEmployment.be  This test is no t yet approved or cleared by the [place] FDA and  has been authorized for detection and/or diagnosis of SARS-CoV-2 by FDA under an Emergency Use Authorization (EUA). This EUA will remain  in effect (meaning this test can be used) for the duration of the COVID-19 declaration under Section  564(b)(1) of the Act, 21 U.S.C.section 360bbb-3(b)(1), unless the authorization is terminated  or revoked sooner.       Influenza A by PCR NEGATIVE NEGATIVE Final   Influenza B by PCR NEGATIVE NEGATIVE Final    Comment: (NOTE) The Xpert Xpress SARS-CoV-2/FLU/RSV plus assay is intended as an aid in the diagnosis of influenza from Nasopharyngeal swab specimens and should not be used as a sole basis for treatment. Nasal washings and aspirates are unacceptable for Xpert Xpress SARS-CoV-2/FLU/RSV testing.  Fact Sheet for Patients: EntrepreneurPulse.com.au  Fact Sheet for Healthcare Providers: IncredibleEmployment.be  This test is not yet approved or cleared by the [place] FDA and has been authorized for detection and/or diagnosis of SARS-CoV-2 by FDA under an Emergency Use Authorization (EUA). This EUA will remain in effect (meaning this test can be used) for the duration of the COVID-19 declaration under Section 564(b)(1) of the Act, 21 U.S.C. section 360bbb-3(b)(1), unless the authorization is terminated or revoked.  Performed at [facility], [address], [place], [place] [address]   Blood culture (routine  x 2)     Status: None (Preliminary result)   Collection Time: 02/15/21  1:44 AM   Specimen: BLOOD  Result Value Ref Range Status   Specimen Description BLOOD RIGHT ASSIST CONTROL  Final   Special Requests   Final    BOTTLES DRAWN AEROBIC AND ANAEROBIC Blood Culture results may not be optimal due to an excessive volume of blood received in culture bottles   Culture   Final    NO GROWTH 1 DAY Performed at [facility], [address], [place], [place] [address]    Report Status PENDING  Incomplete  Blood culture (routine x 2)     Status: None (Preliminary result)   Collection Time: 02/15/21  1:44 AM   Specimen: BLOOD  Result Value Ref Range Status   Specimen Description BLOOD RIGHT ASSIST CONTROL  Final    Special Requests   Final    BOTTLES DRAWN AEROBIC AND ANAEROBIC Blood Culture adequate volume   Culture   Final    NO GROWTH 1 DAY Performed at [facility], [address], [place], [place] [address]    Report Status PENDING  Incomplete         Radiology Studies: No results found.      Scheduled Meds:  enoxaparin (LOVENOX) injection  30 mg Subcutaneous Q24H   insulin aspart  0-15 Units Subcutaneous TID WC   insulin aspart  0-5 Units Subcutaneous QHS   insulin glargine-yfgn  20 Units Subcutaneous QHS   Continuous Infusions:  sodium chloride Stopped (02/16/21 2015)   azithromycin Stopped (02/16/21 2343)   cefTRIAXone (ROCEPHIN)  IV Stopped (02/17/21 0058)     LOS: 2 days    Time spent: 15 mins    Wyvonnia Dusky, MD [facility] Pager [phone]  If 7PM-7AM, please contact night-coverage  02/17/2021, 8:07 AM

## 2021-02-17 NOTE — Progress Notes (Signed)
Mobility Specialist - Progress Note   02/17/21 1300  Mobility  Activity Ambulated in hall  Level of Assistance Standby assist, set-up cues, supervision of patient - no hands on  Assistive Device Front wheel walker  Distance Ambulated (ft) 200 ft  Mobility Ambulated with assistance in hallway  Mobility Response Tolerated well  Mobility performed by Mobility specialist  $Mobility charge 1 Mobility    Pre-mobility: 74 HR, 96% SpO2 During mobility: 95% SpO2 Post-mobility: 91 HR, 96% SpO2   Pt sitting in recliner, utilizing RA. Supervision to stand. Ambulated in hallway with RW. Denied SOB, no complaints. O2 maintained upper 90s during activity. Pt returned to recliner with alarm set.    Kathee Delton Mobility Specialist 02/17/21, 1:43 PM

## 2021-02-18 DIAGNOSIS — I10 Essential (primary) hypertension: Secondary | ICD-10-CM

## 2021-02-18 LAB — CBC
HCT: 36.1 % (ref 36.0–46.0)
Hemoglobin: 12.1 g/dL (ref 12.0–15.0)
MCH: 29.2 pg (ref 26.0–34.0)
MCHC: 33.5 g/dL (ref 30.0–36.0)
MCV: 87.2 fL (ref 80.0–100.0)
Platelets: 284 10*3/uL (ref 150–400)
RBC: 4.14 MIL/uL (ref 3.87–5.11)
RDW: 13.2 % (ref 11.5–15.5)
WBC: 7.6 10*3/uL (ref 4.0–10.5)
nRBC: 0 % (ref 0.0–0.2)

## 2021-02-18 LAB — BASIC METABOLIC PANEL
Anion gap: 5 (ref 5–15)
BUN: 18 mg/dL (ref 8–23)
CO2: 25 mmol/L (ref 22–32)
Calcium: 8.7 mg/dL — ABNORMAL LOW (ref 8.9–10.3)
Chloride: 109 mmol/L (ref 98–111)
Creatinine, Ser: 1.25 mg/dL — ABNORMAL HIGH (ref 0.44–1.00)
GFR, Estimated: 44 mL/min — ABNORMAL LOW (ref >60)
Glucose, Bld: 122 mg/dL — ABNORMAL HIGH (ref 70–99)
Potassium: 4.5 mmol/L (ref 3.5–5.1)
Sodium: 139 mmol/L (ref 135–145)

## 2021-02-18 LAB — GLUCOSE, CAPILLARY
Glucose-Capillary: 79 mg/dL (ref 70–99)
Glucose-Capillary: 190 mg/dL — ABNORMAL HIGH (ref 70–99)

## 2021-02-18 MED ORDER — ENOXAPARIN SODIUM 40 MG/0.4ML IJ SOSY
40.0000 mg | PREFILLED_SYRINGE | INTRAMUSCULAR | Status: DC
  Administered 2021-02-18: 40 mg via SUBCUTANEOUS
  Filled 2021-02-18: qty 0.4

## 2021-02-18 MED ORDER — AZITHROMYCIN 250 MG PO TABS: 500.0000 mg | ORAL_TABLET | Freq: Every day | ORAL | Status: DC

## 2021-02-18 MED ORDER — AZITHROMYCIN 250 MG PO TABS: 250.0000 mg | ORAL_TABLET | Freq: Once | ORAL | 0 refills | Status: AC

## 2021-02-18 NOTE — TOC Transition Note (Signed)
Transition of Care [facility]) - CM/SW Discharge Note   Patient Details  Name: Theresa Booker MRN: [id number] Date of Birth: 10-30-1945  Transition of Care [facility]) CM/SW Contact:  Candie Chroman, LCSW Phone Number: 02/18/2021, 12:15 PM   Clinical Narrative:  Patient has orders to discharge home today. [place] representative is aware. DME was delivered to the room yesterday. No further concerns. CSW signing off.  Final next level of care: [place] Barriers to Discharge: Barriers Resolved   Patient Goals and CMS Choice        Discharge Placement                Patient to be transferred to facility by: Son will pick her up   Patient and family notified of of transfer: 02/18/21  Discharge Plan and Services   Discharge Planning Services: CM Consult Post Acute Care Choice: Home Health          DME Arranged: 3-N-1, Shower stool, Walker rolling DME Agency: [facility] Date DME Agency Contacted: 02/17/21   Representative spoke with at DME Agency: [place]: RN, PT, OT [facility] Agency: [place] ([place]) Date [place]: 02/18/21   Representative spoke with at [place]: [place] ([place]) Interventions     Readmission Risk Interventions Readmission Risk Prevention Plan 08/09/2018  Transportation Screening Complete  Medication Review Press photographer) Complete  HRI or Home Care Consult Complete  Some recent data might be hidden

## 2021-02-18 NOTE — Progress Notes (Signed)
Mobility Specialist - Progress Note   02/18/21 1300  Mobility  Activity Ambulated to bathroom;Ambulated in room  Range of Motion/Exercises Active  Level of Assistance Standby assist, set-up cues, supervision of patient - no hands on  Assistive Device None  Distance Ambulated (ft) 25 ft  Mobility Out of bed for toileting;Ambulated with assistance in room  Mobility Response Tolerated well  Mobility performed by Mobility specialist  $Mobility charge 1 Mobility    Pt lying in bed upon arrival, utilizing RA. Pt able to sit EOB and participate in UB/LB dressing with supervision. Don/doff socks and shoes in figure 4 position. Ambulated to bathroom for urinary output prior to ambulating to sink for ADLs. Pt returned to EOB with needs in reach, anticipating d/c.    Kathee Delton Mobility Specialist 02/18/21, 1:12 PM

## 2021-02-18 NOTE — Discharge Summary (Addendum)
Physician Discharge Summary  Theresa Booker LID:[id number] DOB: 1945-08-08 DOA: 02/15/2021  PCP[facility] [place] date: 02/15/2021 Discharge date: 02/18/2021  Admitted From: home  Disposition:  home w/ home health   Recommendations for Outpatient Follow-up:  Follow up with PCP in 1-2 weeks   Home Health: yes Equipment/Devices:  Discharge Condition: stable  CODE STATUS: full  Diet recommendation: Heart Healthy/carb modified   Brief/Interim Summary: HPI was taken from Dr. Damita Dunnings: Theresa Booker is a 75 y.o. female with medical history significant for DM, HTN, fibromyalgia and chronic pain, depression who presents to the ED with a 2-week history of cough productive of yellow phlegm, congestion, generalized malaise and dizziness.  She denies fever or chills and denies muscle aches.  Denies chest pain, nausea, vomiting, abdominal pain or diarrhea but has had decreased oral intake due to poor appetite   ED course: BP 108/56 on arrival with otherwise normal vitals Blood work WBC normal BMP significant for creatinine of 3.92, up from baseline of 1.53 and glucose of 4 7 Troponin 13 COVID and flu negative   EKG, personally viewed and interpreted: NSR at 83 with nonspecific ST-T wave changes   Chest x-ray: Possible patchy left lower lobe pneumonia   Patient started on Rocephin and azithromycin and given an IV fluid bolus.  Hospitalist consulted for admission.   Hospital course from Dr. Jimmye Norman 12/14-12/17/22: Pt was treated w/ IV rocephin, azithromycin, bronchodilators & incentive spirometry for left lower lobe pneumonia. Pt did require one day of supplemental oxygen as pt desaturated when working with therapy. However, supplemental oxygen was able to be weaned off prior to d/c. PT/OT evaluated the pt and recommended home health. Home health was set up prior to d/c by CM.   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 Encourage incentive spirometry    Acute hypoxic respiratory failure: dropped to low 80s on RA when working with PT. Likely secondary to above. Weaned off of supplemental oxygen    AKI on CKDIIIb: Cr continues to trend down daily. No NSAIDs  DM2: HbA1c 12.9, poorly controlled. Continue on glargine, SSI w/ accuchecks    HTN: restart home dose of losartan, HCTZ   Chronic pain: continue on norco   Discharge Instructions  Discharge Instructions     Diet - low sodium heart healthy   Complete by: As directed    Diet Carb Modified   Complete by: As directed    Discharge instructions   Complete by: As directed    F/u w/ PCP in 1-2 weeks. STOP taking celecoxib and other NSAIDs (ibuprofen, aleve, naproxen, naprosyn, & advil, etc) as these medications are negatively effecting your kidneys and its function.   Increase activity slowly   Complete by: As directed       Allergies as of 02/18/2021       Reactions   Ace Inhibitors Other (See Comments), Cough   Constant persistent cough   Pollen Extracts [pollen Extract] Other (See Comments)   Sneezing/watery eyes/itchy eye   Vicodin [hydrocodone-acetaminophen] Nausea And Vomiting        Medication List     STOP taking these medications    celecoxib 100 MG capsule Commonly known as: CELEBREX  TAKE these medications    acetaminophen 500 MG tablet Commonly known as: TYLENOL Take 500-1,000 mg by mouth every 6 (six) hours as needed for mild pain or headache.   aspirin EC 81 MG tablet Take 81 mg by mouth daily.   azithromycin 250 MG tablet Commonly known as: ZITHROMAX Take 1 tablet (250 mg total) by mouth once for 1 dose.   blood glucose meter kit and supplies Kit Dispense based on  patient and insurance preference. Use up to four times daily as directed. (FOR ICD-9 250.00, 250.01).   diclofenac sodium 1 % Gel Commonly known as: VOLTAREN Apply 2 g topically 2 (two) times daily as needed. What changed: reasons to take this   ezetimibe 10 MG tablet Commonly known as: ZETIA Take 10 mg by mouth daily.   fluticasone furoate-vilanterol 100-25 MCG/INH Aepb Commonly known as: BREO ELLIPTA Inhale into the lungs.   furosemide 40 MG tablet Commonly known as: LASIX Take 40 mg by mouth daily.   gabapentin 300 MG capsule Commonly known as: NEURONTIN Take by mouth 3 (three) times daily.   hydrochlorothiazide 25 MG tablet Commonly known as: HYDRODIURIL Take 25 mg by mouth daily.   insulin glargine 100 UNIT/ML Solostar Pen Commonly known as: LANTUS Inject 40 Units into the skin daily.   isosorbide mononitrate 30 MG 24 hr tablet Commonly known as: IMDUR Take 30 mg by mouth daily.   loratadine 10 MG tablet Commonly known as: CLARITIN Take 10 mg by mouth daily.   losartan 25 MG tablet Commonly known as: COZAAR Take 25 mg by mouth daily.   multivitamin with minerals Tabs tablet Take 1 tablet by mouth daily.   nortriptyline 50 MG capsule Commonly known as: PAMELOR Take 50 mg by mouth at bedtime.   omeprazole 20 MG capsule Commonly known as: PRILOSEC Take 20 mg by mouth daily.   ProAir HFA 108 (90 Base) MCG/ACT inhaler Generic drug: albuterol Inhale 2 puffs into the lungs every 6 (six) hours as needed for wheezing or shortness of breath.   sitaGLIPtin 100 MG tablet Commonly known as: JANUVIA Take 100 mg by mouth daily.   tiotropium 18 MCG inhalation capsule Commonly known as: SPIRIVA Place 18 mcg into inhaler and inhale daily.   venlafaxine XR 75 MG 24 hr capsule Commonly known as: EFFEXOR-XR Take 225 mg by mouth daily with breakfast.   Vitamin D3 50 MCG (2000 UT) Tabs Take 2,000 Units by mouth daily.               Durable Medical  Equipment  (From admission, onward)           Start     Ordered   02/16/21 1616  For home use only DME Walker rolling  Once       Question Answer Comment  Walker: With [employer] Wheels   Patient needs a walker to treat with the following condition Generalized weakness      02/16/21 1615   02/16/21 1616  For home use only DME Bedside commode  Once       Question:  Patient needs a bedside commode to treat with the following condition  Answer:  Generalized weakness   02/16/21 1615   02/16/21 1616  For home use only DME Shower stool  Once        02/16/21 1615            Follow-up Information     [facility]   Specialty: Home Health  Services Why: They will follow up with you for your home health needs: Physical therapy, occupational therapy, and nurse.               Allergies  Allergen Reactions   Ace Inhibitors Other (See Comments) and Cough    Constant persistent cough   Pollen Extracts [Pollen Extract] Other (See Comments)    Sneezing/watery eyes/itchy eye   Vicodin [Hydrocodone-Acetaminophen] Nausea And Vomiting    Consultations:    Procedures/Studies: DG Chest 2 View  Result Date: 02/14/2021 CLINICAL DATA:  Cough EXAM: CHEST - 2 VIEW COMPARISON:  05/27/2019 FINDINGS: Linear atelectasis or scar at the left base. Patchy left lower lobe opacity, possible pneumonia. Normal cardiomediastinal silhouette. No pneumothorax. IMPRESSION: Possible patchy left lower lobe pneumonia. Radiographic follow-up to resolution is recommended. Electronically Signed   By: Donavan Foil M.D.   On: 02/14/2021 22:30   (Echo, Carotid, EGD, Colonoscopy, ERCP)    Subjective: Pt c/o fatigue    Discharge Exam: Vitals:   02/18/21 0438 02/18/21 0747  BP: (!) 151/83 134/89  Pulse: 69 65  Resp: 19 14  Temp: 98.5 F (36.9 C) 98.6 F (37 C)  SpO2: 100% 97%   Vitals:   02/17/21 1547 02/17/21 2011 02/18/21 0438 02/18/21 0747  BP: 138/87 128/81 (!)  151/83 134/89  Pulse: 77 77 69 65  Resp: _0 Temp: 98.6 F (37 C) 98.1 F (36.7 C) 98.5 F (36.9 C) 98.6 F (37 C)  TempSrc:  Oral Oral   SpO2: 100% 99% 100% 97%  Weight:      Height:        General: Pt is alert, awake, not in acute distress Cardiovascular:  S1/S2 +, no rubs, no gallops Respiratory: decreased breath sounds b/l otherwise clear  Abdominal: Soft, NT, obese, bowel sounds + Extremities: no edema, no cyanosis    The results of significant diagnostics from this hospitalization (including imaging, microbiology, ancillary and laboratory) are listed below for reference.     Microbiology: Recent Results (from the past 240 hour(s))  Resp Panel by RT-PCR (Flu A&B, Covid) Nasopharyngeal Swab     Status: None   Collection Time: 02/14/21  7:56 PM   Specimen: Nasopharyngeal Swab; Nasopharyngeal(NP) swabs in vial transport medium  Result Value Ref Range Status   SARS Coronavirus 2 by RT PCR NEGATIVE NEGATIVE Final    Comment: (NOTE) SARS-CoV-2 target nucleic acids are NOT DETECTED.  The SARS-CoV-2 RNA is generally detectable in upper respiratory specimens during the acute phase of infection. The lowest concentration of SARS-CoV-2 viral copies this assay can detect is 138 copies/mL. A negative result does not preclude SARS-Cov-2 infection and should not be used as the sole basis for treatment or other patient management decisions. A negative result may occur with  improper specimen collection/handling, submission of specimen other than nasopharyngeal swab, presence of viral mutation(s) within the areas targeted by this assay, and inadequate number of viral copies(<138 copies/mL). A negative result must be combined with clinical observations, patient history, and epidemiological information. The expected result is Negative.  Fact Sheet for Patients:  EntrepreneurPulse.com.au  Fact Sheet for Healthcare Providers:   IncredibleEmployment.be  This test is no t yet approved or cleared by the [place] FDA and  has been authorized for detection and/or diagnosis of SARS-CoV-2 by FDA under an Emergency Use Authorization (EUA). This EUA will remain  in effect (meaning this test can be used) for the duration of the COVID-19 declaration under Section 564(b)(1) of the  Act, 21 U.S.C.section 360bbb-3(b)(1), unless the authorization is terminated  or revoked sooner.       Influenza A by PCR NEGATIVE NEGATIVE Final   Influenza B by PCR NEGATIVE NEGATIVE Final    Comment: (NOTE) The Xpert Xpress SARS-CoV-2/FLU/RSV plus assay is intended as an aid in the diagnosis of influenza from Nasopharyngeal swab specimens and should not be used as a sole basis for treatment. Nasal washings and aspirates are unacceptable for Xpert Xpress SARS-CoV-2/FLU/RSV testing.  Fact Sheet for Patients: EntrepreneurPulse.com.au  Fact Sheet for Healthcare Providers: IncredibleEmployment.be  This test is not yet approved or cleared by the [place] FDA and has been authorized for detection and/or diagnosis of SARS-CoV-2 by FDA under an Emergency Use Authorization (EUA). This EUA will remain in effect (meaning this test can be used) for the duration of the COVID-19 declaration under Section 564(b)(1) of the Act, 21 U.S.C. section 360bbb-3(b)(1), unless the authorization is terminated or revoked.  Performed at [facility], [address] [place], [place] [address]   Blood culture (routine x 2)     Status: None (Preliminary result)   Collection Time: 02/15/21  1:44 AM   Specimen: BLOOD  Result Value Ref Range Status   Specimen Description BLOOD RIGHT ASSIST CONTROL  Final   Special Requests   Final    BOTTLES DRAWN AEROBIC AND ANAEROBIC Blood Culture results may not be optimal due to an excessive volume of blood received in culture bottles   Culture    Final    NO GROWTH 3 DAYS Performed at [facility], [address] [place], [place] [address]    Report Status PENDING  Incomplete  Blood culture (routine x 2)     Status: None (Preliminary result)   Collection Time: 02/15/21  1:44 AM   Specimen: BLOOD  Result Value Ref Range Status   Specimen Description BLOOD RIGHT ASSIST CONTROL  Final   Special Requests   Final    BOTTLES DRAWN AEROBIC AND ANAEROBIC Blood Culture adequate volume   Culture   Final    NO GROWTH 3 DAYS Performed at [facility], [address] [place], [place] [address]    Report Status PENDING  Incomplete     Labs: BNP (last 3 results) No results for input(s): BNP in the last 8760 hours. Basic Metabolic Panel: Recent Labs  Lab 02/14/21 1956 02/15/21 0802 02/16/21 0234 02/17/21 0539 02/18/21 0517  NA 125* 131* 133* 136 139  K 5.1 4.0 4.9 4.1 4.5  CL 91* 100 101 107 109  CO2 _0 GLUCOSE 407* 189* 197* 84 122*  BUN 72* 67* 58* 34* 18  CREATININE 3.92* 3.33*   3.45* 2.49* 1.76* 1.25*  CALCIUM 9.6 8.9 8.6* 8.6* 8.7*   Liver Function Tests: Recent Labs  Lab 02/14/21 1956  AST 21  ALT 18  ALKPHOS 122  BILITOT 0.7  PROT 8.0  ALBUMIN 3.8   No results for input(s): LIPASE, AMYLASE in the last 168 hours. No results for input(s): AMMONIA in the last 168 hours. CBC: Recent Labs  Lab 02/14/21 1956 02/15/21 0802 02/16/21 0234 02/17/21 0539 02/18/21 0517  WBC 9.7 9.2 8.1 9.1 7.6  HGB 12.3 11.2* 11.0* 11.0* 12.1  HCT 36.4 33.0* 33.0* 32.6* 36.1  MCV 86.5 84.4 86.2 87.4 87.2  PLT 281 281 258 252 284   Cardiac Enzymes: No results for input(s): CKTOTAL, CKMB, CKMBINDEX, TROPONINI in the last 168 hours. BNP: Invalid input(s): POCBNP CBG: Recent  Labs  Lab 02/17/21 0734 02/17/21 1135 02/17/21 1611 02/17/21 2108 02/18/21 0749  GLUCAP 77 180* 101* 188* 79   D-Dimer No results for input(s): DDIMER in the last 72 hours. Hgb A1c No results for input(s):  HGBA1C in the last 72 hours. Lipid Profile No results for input(s): CHOL, HDL, LDLCALC, TRIG, CHOLHDL, LDLDIRECT in the last 72 hours. Thyroid function studies No results for input(s): TSH, T4TOTAL, T3FREE, THYROIDAB in the last 72 hours.  Invalid input(s): FREET3 Anemia work up No results for input(s): VITAMINB12, FOLATE, FERRITIN, TIBC, IRON, RETICCTPCT in the last 72 hours. Urinalysis    Component Value Date/Time   COLORURINE YELLOW 02/15/2021 0144   APPEARANCEUR CLEAR 02/15/2021 0144   APPEARANCEUR Cloudy 07/07/2013 1810   LABSPEC >1.030 (H) 02/15/2021 0144   LABSPEC 1.018 07/07/2013 1810   PHURINE 5.5 02/15/2021 0144   GLUCOSEU NEGATIVE 02/15/2021 0144   GLUCOSEU Negative 07/07/2013 1810   HGBUR NEGATIVE 02/15/2021 0144   BILIRUBINUR NEGATIVE 02/15/2021 0144   BILIRUBINUR Negative 07/07/2013 1810   KETONESUR TRACE (A) 02/15/2021 0144   PROTEINUR NEGATIVE 02/15/2021 0144   NITRITE NEGATIVE 02/15/2021 0144   LEUKOCYTESUR SMALL (A) 02/15/2021 0144   LEUKOCYTESUR Trace 07/07/2013 1810   Sepsis Labs Invalid input(s): PROCALCITONIN,  WBC,  LACTICIDVEN Microbiology Recent Results (from the past 240 hour(s))  Resp Panel by RT-PCR (Flu A&B, Covid) Nasopharyngeal Swab     Status: None   Collection Time: 02/14/21  7:56 PM   Specimen: Nasopharyngeal Swab; Nasopharyngeal(NP) swabs in vial transport medium  Result Value Ref Range Status   SARS Coronavirus 2 by RT PCR NEGATIVE NEGATIVE Final    Comment: (NOTE) SARS-CoV-2 target nucleic acids are NOT DETECTED.  The SARS-CoV-2 RNA is generally detectable in upper respiratory specimens during the acute phase of infection. The lowest concentration of SARS-CoV-2 viral copies this assay can detect is 138 copies/mL. A negative result does not preclude SARS-Cov-2 infection and should not be used as the sole basis for treatment or other patient management decisions. A negative result may occur with  improper specimen collection/handling,  submission of specimen other than nasopharyngeal swab, presence of viral mutation(s) within the areas targeted by this assay, and inadequate number of viral copies(<138 copies/mL). A negative result must be combined with clinical observations, patient history, and epidemiological information. The expected result is Negative.  Fact Sheet for Patients:  EntrepreneurPulse.com.au  Fact Sheet for Healthcare Providers:  IncredibleEmployment.be  This test is no t yet approved or cleared by the [place] FDA and  has been authorized for detection and/or diagnosis of SARS-CoV-2 by FDA under an Emergency Use Authorization (EUA). This EUA will remain  in effect (meaning this test can be used) for the duration of the COVID-19 declaration under Section 564(b)(1) of the Act, 21 U.S.C.section 360bbb-3(b)(1), unless the authorization is terminated  or revoked sooner.       Influenza A by PCR NEGATIVE NEGATIVE Final   Influenza B by PCR NEGATIVE NEGATIVE Final    Comment: (NOTE) The Xpert Xpress SARS-CoV-2/FLU/RSV plus assay is intended as an aid in the diagnosis of influenza from Nasopharyngeal swab specimens and should not be used as a sole basis for treatment. Nasal washings and aspirates are unacceptable for Xpert Xpress SARS-CoV-2/FLU/RSV testing.  Fact Sheet for Patients: EntrepreneurPulse.com.au  Fact Sheet for Healthcare Providers: IncredibleEmployment.be  This test is not yet approved or cleared by the [place] FDA and has been authorized for detection and/or diagnosis of SARS-CoV-2 by FDA under an Emergency Use Authorization (  EUA). This EUA will remain in effect (meaning this test can be used) for the duration of the COVID-19 declaration under Section 564(b)(1) of the Act, 21 U.S.C. section 360bbb-3(b)(1), unless the authorization is terminated or revoked.  Performed at [facility], [address] [place], [place] [address]   Blood culture (routine x 2)     Status: None (Preliminary result)   Collection Time: 02/15/21  1:44 AM   Specimen: BLOOD  Result Value Ref Range Status   Specimen Description BLOOD RIGHT ASSIST CONTROL  Final   Special Requests   Final    BOTTLES DRAWN AEROBIC AND ANAEROBIC Blood Culture results may not be optimal due to an excessive volume of blood received in culture bottles   Culture   Final    NO GROWTH 3 DAYS Performed at [facility], [address], [place], [place] [address]    Report Status PENDING  Incomplete  Blood culture (routine x 2)     Status: None (Preliminary result)   Collection Time: 02/15/21  1:44 AM   Specimen: BLOOD  Result Value Ref Range Status   Specimen Description BLOOD RIGHT ASSIST CONTROL  Final   Special Requests   Final    BOTTLES DRAWN AEROBIC AND ANAEROBIC Blood Culture adequate volume   Culture   Final    NO GROWTH 3 DAYS Performed at [facility], [address], [place],  [address]    Report Status PENDING  Incomplete     Time coordinating discharge: Over 30 minutes  SIGNED:   Wyvonnia Dusky, MD  [facility] 02/18/2021, 11:40 AM Pager   If 7PM-7AM, please contact night-coverage

## 2021-02-18 NOTE — Progress Notes (Signed)
PHARMACIST - PHYSICIAN COMMUNICATION  CONCERNING:  Enoxaparin (Lovenox) for DVT Prophylaxis    RECOMMENDATION: Patient was prescribed enoxaparin 30mg  q24 hours for VTE prophylaxis.   Filed Weights   02/14/21 2011  Weight: 77.1 kg (170 lb)    Body mass index is 33.2 kg/m.  Estimated Creatinine Clearance: 34.6 mL/min (A) (by C-G formula based on SCr of 1.25 mg/dL (H)).  Patient is candidate for enoxaparin 40mg  every 24 hours based on CrCl >31ml/min and Weight >45kg  DESCRIPTION: Pharmacy has adjusted enoxaparin dose per [facility] policy.  Patient is now receiving enoxaparin 40 mg every 24 hours   Benita Gutter 02/18/2021 7:47 AM

## 2021-02-18 NOTE — Progress Notes (Signed)
PHARMACIST - PHYSICIAN COMMUNICATION  CONCERNING: Antibiotic IV to Oral Route Change Policy  RECOMMENDATION: This patient is receiving azithromycin by the intravenous route.  Based on criteria approved by the Pharmacy and Therapeutics Committee, the antibiotic(s) is/are being converted to the equivalent oral dose form(s).   DESCRIPTION: These criteria include: Patient being treated for a respiratory tract infection, urinary tract infection, cellulitis or clostridium difficile associated diarrhea if on metronidazole The patient is not neutropenic and does not exhibit a GI malabsorption state The patient is eating (either orally or via tube) and/or has been taking other orally administered medications for a least 24 hours The patient is improving clinically and has a Tmax < 100.5  If you have questions about this conversion, please contact the [place]  02/18/21

## 2021-02-20 LAB — CULTURE, BLOOD (ROUTINE X 2)
Culture: NO GROWTH
Culture: NO GROWTH
Special Requests: ADEQUATE

## 2021-08-03 ENCOUNTER — Emergency Department: Payer: Medicare Other

## 2021-08-03 ENCOUNTER — Inpatient Hospital Stay
Admission: EM | Admit: 2021-08-03 | Discharge: 2021-08-05 | DRG: 682 | Disposition: A | Discharge status: Home Health | Payer: Medicare Other | Attending: Internal Medicine | Admitting: Internal Medicine

## 2021-08-03 ENCOUNTER — Encounter: Payer: Self-pay | Admitting: *Deleted

## 2021-08-03 ENCOUNTER — Other Ambulatory Visit: Payer: Self-pay

## 2021-08-03 DIAGNOSIS — G9341 Metabolic encephalopathy: Secondary | ICD-10-CM | POA: Diagnosis present

## 2021-08-03 DIAGNOSIS — E11649 Type 2 diabetes mellitus with hypoglycemia without coma: Secondary | ICD-10-CM | POA: Diagnosis present

## 2021-08-03 DIAGNOSIS — E559 Vitamin D deficiency, unspecified: Secondary | ICD-10-CM | POA: Diagnosis present

## 2021-08-03 DIAGNOSIS — N1832 Chronic kidney disease, stage 3b: Secondary | ICD-10-CM

## 2021-08-03 DIAGNOSIS — Z7982 Long term (current) use of aspirin: Secondary | ICD-10-CM

## 2021-08-03 DIAGNOSIS — R410 Disorientation, unspecified: Secondary | ICD-10-CM

## 2021-08-03 DIAGNOSIS — N179 Acute kidney failure, unspecified: Principal | ICD-10-CM

## 2021-08-03 DIAGNOSIS — E669 Obesity, unspecified: Secondary | ICD-10-CM | POA: Diagnosis present

## 2021-08-03 DIAGNOSIS — Z79899 Other long term (current) drug therapy: Secondary | ICD-10-CM

## 2021-08-03 DIAGNOSIS — Z7951 Long term (current) use of inhaled steroids: Secondary | ICD-10-CM

## 2021-08-03 DIAGNOSIS — E860 Dehydration: Secondary | ICD-10-CM | POA: Diagnosis present

## 2021-08-03 DIAGNOSIS — E1122 Type 2 diabetes mellitus with diabetic chronic kidney disease: Secondary | ICD-10-CM

## 2021-08-03 DIAGNOSIS — E1140 Type 2 diabetes mellitus with diabetic neuropathy, unspecified: Secondary | ICD-10-CM | POA: Diagnosis present

## 2021-08-03 DIAGNOSIS — E785 Hyperlipidemia, unspecified: Secondary | ICD-10-CM | POA: Diagnosis present

## 2021-08-03 DIAGNOSIS — Z6833 Body mass index (BMI) 33.0-33.9, adult: Secondary | ICD-10-CM

## 2021-08-03 DIAGNOSIS — Z803 Family history of malignant neoplasm of breast: Secondary | ICD-10-CM

## 2021-08-03 DIAGNOSIS — N184 Chronic kidney disease, stage 4 (severe): Secondary | ICD-10-CM | POA: Diagnosis present

## 2021-08-03 DIAGNOSIS — M797 Fibromyalgia: Secondary | ICD-10-CM | POA: Diagnosis present

## 2021-08-03 DIAGNOSIS — I129 Hypertensive chronic kidney disease with stage 1 through stage 4 chronic kidney disease, or unspecified chronic kidney disease: Secondary | ICD-10-CM | POA: Diagnosis present

## 2021-08-03 DIAGNOSIS — F32A Depression, unspecified: Secondary | ICD-10-CM

## 2021-08-03 DIAGNOSIS — Z794 Long term (current) use of insulin: Secondary | ICD-10-CM

## 2021-08-03 DIAGNOSIS — Z833 Family history of diabetes mellitus: Secondary | ICD-10-CM

## 2021-08-03 LAB — CBC WITH DIFFERENTIAL/PLATELET
Abs Immature Granulocytes: 0.08 10*3/uL — ABNORMAL HIGH (ref 0.00–0.07)
Basophils Absolute: 0 10*3/uL (ref 0.0–0.1)
Basophils Relative: 1 %
Eosinophils Absolute: 0.2 10*3/uL (ref 0.0–0.5)
Eosinophils Relative: 2 %
HCT: 33.9 % — ABNORMAL LOW (ref 36.0–46.0)
Hemoglobin: 11 g/dL — ABNORMAL LOW (ref 12.0–15.0)
Immature Granulocytes: 1 %
Lymphocytes Relative: 41 %
Lymphs Abs: 3.3 10*3/uL (ref 0.7–4.0)
MCH: 28.5 pg (ref 26.0–34.0)
MCHC: 32.4 g/dL (ref 30.0–36.0)
MCV: 87.8 fL (ref 80.0–100.0)
Monocytes Absolute: 0.7 10*3/uL (ref 0.1–1.0)
Monocytes Relative: 9 %
Neutro Abs: 3.8 10*3/uL (ref 1.7–7.7)
Neutrophils Relative %: 46 %
Platelets: 327 10*3/uL (ref 150–400)
RBC: 3.86 MIL/uL — ABNORMAL LOW (ref 3.87–5.11)
RDW: 13.9 % (ref 11.5–15.5)
WBC: 8.1 10*3/uL (ref 4.0–10.5)
nRBC: 0.2 % (ref 0.0–0.2)

## 2021-08-03 LAB — COMPREHENSIVE METABOLIC PANEL
ALT: 11 U/L (ref 0–44)
AST: 19 U/L (ref 15–41)
Albumin: 3.4 g/dL — ABNORMAL LOW (ref 3.5–5.0)
Alkaline Phosphatase: 91 U/L (ref 38–126)
Anion gap: 4 — ABNORMAL LOW (ref 5–15)
BUN: 66 mg/dL — ABNORMAL HIGH (ref 8–23)
CO2: 25 mmol/L (ref 22–32)
Calcium: 9.1 mg/dL (ref 8.9–10.3)
Chloride: 110 mmol/L (ref 98–111)
Creatinine, Ser: 2.72 mg/dL — ABNORMAL HIGH (ref 0.44–1.00)
GFR, Estimated: 17 mL/min — ABNORMAL LOW (ref >60)
Glucose, Bld: 233 mg/dL — ABNORMAL HIGH (ref 70–99)
Potassium: 5.3 mmol/L — ABNORMAL HIGH (ref 3.5–5.1)
Sodium: 139 mmol/L (ref 135–145)
Total Bilirubin: 0.5 mg/dL (ref 0.3–1.2)
Total Protein: 7.3 g/dL (ref 6.5–8.1)

## 2021-08-03 LAB — URINALYSIS, ROUTINE W REFLEX MICROSCOPIC
Bilirubin Urine: NEGATIVE
Glucose, UA: NEGATIVE mg/dL
Hgb urine dipstick: NEGATIVE
Ketones, ur: NEGATIVE mg/dL
Leukocytes,Ua: NEGATIVE
Nitrite: NEGATIVE
Protein, ur: NEGATIVE mg/dL
Specific Gravity, Urine: 1.012 (ref 1.005–1.030)
pH: 5 (ref 5.0–8.0)

## 2021-08-03 LAB — TROPONIN I (HIGH SENSITIVITY): Troponin I (High Sensitivity): 8 ng/L (ref <18)

## 2021-08-03 NOTE — ED Triage Notes (Signed)
Grandson states pt confused for past 3 days, sleeping a lot.  No n/v/d.  Pt also has been having diff keeping her balance when walking.  Pt also has a headache.

## 2021-08-03 NOTE — ED Provider Notes (Signed)
[facility] Provider Note    Event Date/Time   First MD Initiated Contact with Patient 08/03/21 2300     (approximate)   History   Altered Mental Status   HPI  Level 5 caveat: History is limited by the patient's confusion and is provided in large part by the patient's adult grandson who is at bedside.  Theresa Booker is a 76 y.o. female whose medical history is listed as including a prior subdural hematoma, prior episode of sepsis, chronic pain syndromes, altered mental status, and, notably, prior history of both gait instability and headache disorder.  She presents tonight by private vehicle for evaluation of "not being herself" for the last couple of days.  The family has noticed that her behavior has been different over the last couple of days although difficult to explain or quantify.  Her grandson reports that he found her on all fours on the couch yesterday and asked her what she was doing, but she could not explain why she was in that position.  This evening around 8:30 PM she called her daughter and ask if the girls were ready to go to school; usually she takes her granddaughters to school in the morning, but she was confused about the time of day.  The patient states that she has been nauseated for the last couple of days but not had any vomiting.  She said that she thinks she has been eating and drinking enough but she feels quite thirsty right now.  She has a mild generalized headache.  She also said that she feels off balance and like she is having difficulty walking.  She cannot tell me when the symptoms started but thinks they have been going on for at least a couple of days.  She denies chest pain, shortness of breath, vomiting (has just had nausea), and dysuria.  She said that she thinks she knows she has been confused but is not really sure.  She says she is very thirsty, but thinks she has been eating and drinking.     Physical Exam   Triage  Vital Signs: ED Triage Vitals  Enc Vitals Group     BP 08/03/21 2232 122/79     Pulse Rate 08/03/21 2232 89     Resp 08/03/21 2232 20     Temp 08/03/21 2232 98.3 F (36.8 C)     Temp Source 08/03/21 2232 Oral     SpO2 08/03/21 2232 96 %     Weight 08/03/21 2233 77.1 kg (170 lb)     Height 08/03/21 2233 1.524 m (5')     Head Circumference --      Peak Flow --      Pain Score 08/03/21 2231 5     Pain Loc --      Pain Edu? --      Excl. in [place]? --     Most recent vital signs: Vitals:   08/03/21 2232  BP: 122/79  Pulse: 89  Resp: 20  Temp: 98.3 F (36.8 C)  SpO2: 96%     General: Awake, no distress.  Confused, GCS 14. CV:  Good peripheral perfusion.  Normal heart sounds. Resp:  Normal effort.  Lungs are clear to auscultation bilaterally. Abd:  No distention.  No tenderness to palpation of the abdomen. Other:  Pupils are equal and reactive.  Good major muscle group strength in arms and legs.  No dysmetria on finger-to-nose testing.  No slurred speech.  No  visible facial nerve deficits.  NIH stroke scale 0.   ED Results / Procedures / Treatments   Labs (all labs ordered are listed, but only abnormal results are displayed) Labs Reviewed  URINALYSIS, ROUTINE W REFLEX MICROSCOPIC - Abnormal; Notable for the following components:      Result Value   Color, Urine STRAW (*)    APPearance CLEAR (*)    All other components within normal limits  CBC WITH DIFFERENTIAL/PLATELET - Abnormal; Notable for the following components:   RBC 3.86 (*)    Hemoglobin 11.0 (*)    HCT 33.9 (*)    Abs Immature Granulocytes 0.08 (*)    All other components within normal limits  COMPREHENSIVE METABOLIC PANEL - Abnormal; Notable for the following components:   Potassium 5.3 (*)    Glucose, Bld 233 (*)    BUN 66 (*)    Creatinine, Ser 2.72 (*)    Albumin 3.4 (*)    GFR, Estimated 17 (*)    Anion gap 4 (*)    All other components within normal limits  LACTIC ACID, PLASMA  LACTIC ACID,  PLASMA  TROPONIN I (HIGH SENSITIVITY)  TROPONIN I (HIGH SENSITIVITY)     EKG  ED ECG REPORT I, Hinda Kehr, the attending physician, personally viewed and interpreted this ECG.  Date: 08/03/2021 EKG Time: 22:37 Rate: 83 Rhythm: sinus rhythm QRS Axis: normal Intervals: short PR interval of 80 ms ST/T Wave abnormalities: Non-specific ST segment / T-wave changes, but no clear evidence of acute ischemia. Narrative Interpretation: no definitive evidence of acute ischemia; does not meet STEMI criteria.    RADIOLOGY See ED course for details: CT head and MRI brain both without acute findings.    PROCEDURES:  Critical Care performed: No  .1-3 Lead EKG Interpretation Performed by: Hinda Kehr, MD Authorized by: Hinda Kehr, MD     Interpretation: normal     ECG rate:  85   ECG rate assessment: normal     Rhythm: sinus rhythm     Ectopy: none     Conduction: normal     MEDICATIONS ORDERED IN ED: Medications  lactated ringers bolus 1,000 mL (1,000 mLs Intravenous New Bag/Given 08/04/21 0108)     IMPRESSION / MDM / ASSESSMENT AND PLAN / ED COURSE  I reviewed the triage vital signs and the nursing notes.                              Differential diagnosis includes, but is not limited to, CVA, acute intracranial bleed, metabolic or electrolyte abnormality, acute infection, medication or drug side effect.  Patient's presentation is most consistent with acute presentation with potential threat to life or bodily function.  Most concerned about CVA.  CT head without contrast is pending.  Vital signs are stable and within normal limits.  NIH stroke scale is 0 but she could have ataxia and altered mental status without having any positive value on NIH stroke scale.  Labs ordered include urinalysis, lactic acid, high-sensitivity troponin, comprehensive metabolic panel, CBC with differential.  EKG interpreted, no evidence of acute ischemia.  The patient is on the cardiac  monitor to evaluate for evidence of arrhythmia and/or significant heart rate changes.   Clinical Course as of 08/04/21 0152  Thu Aug 03, 2021  2347 CT Head Wo Contrast I viewed and interpreted the patient's noncontrast head CT.  I see no evidence of acute bleed or obvious large CVA.  Radiology report agrees that there are no acute findings. [CF]  Fri Aug 04, 2021  0043 Interpreted lab results.  CBC unremarkable.  Urinalysis is completely normal.  Initial high-sensitivity troponin is normal. [CF]  0044 Comprehensive metabolic panel(!) CMP, however, is abnormal, with a potassium of 5.3 which is likely due to dehydration and a creatinine of 2.72 and a BUN of 66.  The creatinine of 2.72 represents acute on chronic kidney disease/renal failure.  This could lead to a degree of metabolic encephalopathy.  Awaiting MRI.  I ordered LR 1 L bolus. [CF]  0132 Troponin I (High Sensitivity): 8 Repeat high-sensitivity troponin is 8 [CF]  0133 MR BRAIN WO CONTRAST I viewed and interpreted the patient's MR brain and I could not appreciate any acute abnormalities.  The neuroradiologist also interpreted the study and saw no evidence of acute CVA.  However, given the patient's constellation of symptoms, and the probability that she will worsen if not stabilized in the hospital, I am consulting the hospitalist for admission.  I have also updated the patient and the grandson about the plan. [CF]  [phone] Discussed case by phone with Dr. Sidney Ace with the hospitalist service.  He will admit. [CF]    Clinical Course User Index [CF] Hinda Kehr, MD     FINAL CLINICAL IMPRESSION(S) / ED DIAGNOSES   Final diagnoses:  Acute metabolic encephalopathy  Confusion  Acute kidney injury superimposed on chronic kidney disease ([place])  Dehydration     Rx / DC Orders   ED Discharge Orders     None        Note:  This document was prepared using Dragon voice recognition software and may include unintentional dictation  errors.   Hinda Kehr, MD 08/04/21 [phone]

## 2021-08-04 ENCOUNTER — Inpatient Hospital Stay: Payer: Medicare Other

## 2021-08-04 DIAGNOSIS — Z6833 Body mass index (BMI) 33.0-33.9, adult: Secondary | ICD-10-CM | POA: Diagnosis not present

## 2021-08-04 DIAGNOSIS — N179 Acute kidney failure, unspecified: Secondary | ICD-10-CM

## 2021-08-04 DIAGNOSIS — Z794 Long term (current) use of insulin: Secondary | ICD-10-CM | POA: Diagnosis not present

## 2021-08-04 DIAGNOSIS — G9341 Metabolic encephalopathy: Secondary | ICD-10-CM | POA: Diagnosis present

## 2021-08-04 DIAGNOSIS — E1140 Type 2 diabetes mellitus with diabetic neuropathy, unspecified: Secondary | ICD-10-CM | POA: Diagnosis present

## 2021-08-04 DIAGNOSIS — E785 Hyperlipidemia, unspecified: Secondary | ICD-10-CM | POA: Diagnosis present

## 2021-08-04 DIAGNOSIS — Z79899 Other long term (current) drug therapy: Secondary | ICD-10-CM | POA: Diagnosis not present

## 2021-08-04 DIAGNOSIS — Z803 Family history of malignant neoplasm of breast: Secondary | ICD-10-CM | POA: Diagnosis not present

## 2021-08-04 DIAGNOSIS — N183 Chronic kidney disease, stage 3 unspecified: Secondary | ICD-10-CM

## 2021-08-04 DIAGNOSIS — E1122 Type 2 diabetes mellitus with diabetic chronic kidney disease: Secondary | ICD-10-CM

## 2021-08-04 DIAGNOSIS — N184 Chronic kidney disease, stage 4 (severe): Secondary | ICD-10-CM | POA: Diagnosis present

## 2021-08-04 DIAGNOSIS — F32A Depression, unspecified: Secondary | ICD-10-CM | POA: Diagnosis present

## 2021-08-04 DIAGNOSIS — Z833 Family history of diabetes mellitus: Secondary | ICD-10-CM | POA: Diagnosis not present

## 2021-08-04 DIAGNOSIS — E860 Dehydration: Secondary | ICD-10-CM | POA: Diagnosis present

## 2021-08-04 DIAGNOSIS — I129 Hypertensive chronic kidney disease with stage 1 through stage 4 chronic kidney disease, or unspecified chronic kidney disease: Secondary | ICD-10-CM | POA: Diagnosis present

## 2021-08-04 DIAGNOSIS — E669 Obesity, unspecified: Secondary | ICD-10-CM | POA: Diagnosis present

## 2021-08-04 DIAGNOSIS — Z7982 Long term (current) use of aspirin: Secondary | ICD-10-CM | POA: Diagnosis not present

## 2021-08-04 DIAGNOSIS — E11649 Type 2 diabetes mellitus with hypoglycemia without coma: Secondary | ICD-10-CM | POA: Diagnosis present

## 2021-08-04 DIAGNOSIS — E559 Vitamin D deficiency, unspecified: Secondary | ICD-10-CM | POA: Diagnosis present

## 2021-08-04 DIAGNOSIS — M797 Fibromyalgia: Secondary | ICD-10-CM | POA: Diagnosis present

## 2021-08-04 DIAGNOSIS — Z7951 Long term (current) use of inhaled steroids: Secondary | ICD-10-CM | POA: Diagnosis not present

## 2021-08-04 DIAGNOSIS — N189 Chronic kidney disease, unspecified: Secondary | ICD-10-CM

## 2021-08-04 LAB — CBC
HCT: 30.9 % — ABNORMAL LOW (ref 36.0–46.0)
Hemoglobin: 9.8 g/dL — ABNORMAL LOW (ref 12.0–15.0)
MCH: 27.7 pg (ref 26.0–34.0)
MCHC: 31.7 g/dL (ref 30.0–36.0)
MCV: 87.3 fL (ref 80.0–100.0)
Platelets: 265 10*3/uL (ref 150–400)
RBC: 3.54 MIL/uL — ABNORMAL LOW (ref 3.87–5.11)
RDW: 13.8 % (ref 11.5–15.5)
WBC: 7.2 10*3/uL (ref 4.0–10.5)
nRBC: 0 % (ref 0.0–0.2)

## 2021-08-04 LAB — BASIC METABOLIC PANEL
Anion gap: 3 — ABNORMAL LOW (ref 5–15)
BUN: 56 mg/dL — ABNORMAL HIGH (ref 8–23)
CO2: 25 mmol/L (ref 22–32)
Calcium: 8.9 mg/dL (ref 8.9–10.3)
Chloride: 112 mmol/L — ABNORMAL HIGH (ref 98–111)
Creatinine, Ser: 2.2 mg/dL — ABNORMAL HIGH (ref 0.44–1.00)
GFR, Estimated: 23 mL/min — ABNORMAL LOW (ref >60)
Glucose, Bld: 95 mg/dL (ref 70–99)
Potassium: 4.3 mmol/L (ref 3.5–5.1)
Sodium: 140 mmol/L (ref 135–145)

## 2021-08-04 LAB — GLUCOSE, CAPILLARY
Glucose-Capillary: 82 mg/dL (ref 70–99)
Glucose-Capillary: 196 mg/dL — ABNORMAL HIGH (ref 70–99)

## 2021-08-04 LAB — CBG MONITORING, ED
Glucose-Capillary: 59 mg/dL — ABNORMAL LOW (ref 70–99)
Glucose-Capillary: 158 mg/dL — ABNORMAL HIGH (ref 70–99)

## 2021-08-04 LAB — TROPONIN I (HIGH SENSITIVITY): Troponin I (High Sensitivity): 8 ng/L (ref <18)

## 2021-08-04 LAB — HEMOGLOBIN A1C
Hgb A1c MFr Bld: 8.4 % — ABNORMAL HIGH (ref 4.8–5.6)
Mean Plasma Glucose: 194.38 mg/dL

## 2021-08-04 LAB — LACTIC ACID, PLASMA
Lactic Acid, Venous: 2 mmol/L (ref 0.5–1.9)
Lactic Acid, Venous: 0.9 mmol/L (ref 0.5–1.9)

## 2021-08-04 MED ORDER — DEXTROSE 50 % IV SOLN
1.0000 | Freq: Once | INTRAVENOUS | Status: AC
  Administered 2021-08-04: 50 mL via INTRAVENOUS

## 2021-08-04 MED ORDER — ACETAMINOPHEN 325 MG PO TABS: 650.0000 mg | ORAL_TABLET | Freq: Four times a day (QID) | ORAL | Status: DC | PRN

## 2021-08-04 MED ORDER — TIOTROPIUM BROMIDE MONOHYDRATE 18 MCG IN CAPS: 18.0000 ug | ORAL_CAPSULE | Freq: Every day | RESPIRATORY_TRACT | Status: DC

## 2021-08-04 MED ORDER — ACETAMINOPHEN 650 MG RE SUPP: 650.0000 mg | Freq: Four times a day (QID) | RECTAL | Status: DC | PRN

## 2021-08-04 MED ORDER — LORATADINE 10 MG PO TABS: 10.0000 mg | ORAL_TABLET | Freq: Every day | ORAL | Status: DC

## 2021-08-04 MED ORDER — INSULIN GLARGINE-YFGN 100 UNIT/ML ~~LOC~~ SOLN
25.0000 [IU] | Freq: Every day | SUBCUTANEOUS | Status: DC
  Filled 2021-08-04: qty 0.25

## 2021-08-04 MED ORDER — ALBUTEROL SULFATE HFA 108 (90 BASE) MCG/ACT IN AERS: 2.0000 | INHALATION_SPRAY | Freq: Four times a day (QID) | RESPIRATORY_TRACT | Status: DC | PRN

## 2021-08-04 MED ORDER — VENLAFAXINE HCL ER 75 MG PO CP24
225.0000 mg | ORAL_CAPSULE | Freq: Every day | ORAL | Status: DC
  Administered 2021-08-04: 225 mg via ORAL
  Filled 2021-08-04: qty 3

## 2021-08-04 MED ORDER — INSULIN ASPART 100 UNIT/ML IJ SOLN: 0.0000 [IU] | Freq: Every day | INTRAMUSCULAR | Status: DC

## 2021-08-04 MED ORDER — SODIUM CHLORIDE 0.9 % IV SOLN: INTRAVENOUS | Status: DC

## 2021-08-04 MED ORDER — HEPARIN SODIUM (PORCINE) 5000 UNIT/ML IJ SOLN
5000.0000 [IU] | Freq: Three times a day (TID) | INTRAMUSCULAR | Status: DC
  Administered 2021-08-04 – 2021-08-05 (×4): 5000 [IU] via SUBCUTANEOUS
  Filled 2021-08-04 (×3): qty 1

## 2021-08-04 MED ORDER — ALBUTEROL SULFATE (2.5 MG/3ML) 0.083% IN NEBU: 2.5000 mg | INHALATION_SOLUTION | Freq: Four times a day (QID) | RESPIRATORY_TRACT | Status: DC | PRN

## 2021-08-04 MED ORDER — ISOSORBIDE MONONITRATE ER 30 MG PO TB24
30.0000 mg | ORAL_TABLET | Freq: Every day | ORAL | Status: DC
  Administered 2021-08-04 – 2021-08-05 (×2): 30 mg via ORAL
  Filled 2021-08-04 (×2): qty 1

## 2021-08-04 MED ORDER — PANTOPRAZOLE SODIUM 40 MG PO TBEC
40.0000 mg | DELAYED_RELEASE_TABLET | Freq: Every day | ORAL | Status: DC
  Administered 2021-08-04 – 2021-08-05 (×2): 40 mg via ORAL
  Filled 2021-08-04 (×2): qty 1

## 2021-08-04 MED ORDER — GABAPENTIN 300 MG PO CAPS: 300.0000 mg | ORAL_CAPSULE | Freq: Three times a day (TID) | ORAL | Status: DC

## 2021-08-04 MED ORDER — ONDANSETRON HCL 4 MG PO TABS: 4.0000 mg | ORAL_TABLET | Freq: Four times a day (QID) | ORAL | Status: DC | PRN

## 2021-08-04 MED ORDER — VITAMIN D 25 MCG (1000 UNIT) PO TABS: 2000.0000 [IU] | ORAL_TABLET | Freq: Every day | ORAL | Status: DC

## 2021-08-04 MED ORDER — INSULIN GLARGINE-YFGN 100 UNIT/ML ~~LOC~~ SOLN
40.0000 [IU] | Freq: Every day | SUBCUTANEOUS | Status: DC
  Filled 2021-08-04: qty 0.4

## 2021-08-04 MED ORDER — FERROUS SULFATE 325 (65 FE) MG PO TABS: 325.0000 mg | ORAL_TABLET | Freq: Every day | ORAL | Status: DC

## 2021-08-04 MED ORDER — LACTATED RINGERS IV BOLUS
1000.0000 mL | Freq: Once | INTRAVENOUS | Status: AC
  Administered 2021-08-04: 1000 mL via INTRAVENOUS

## 2021-08-04 MED ORDER — INSULIN ASPART 100 UNIT/ML IJ SOLN
0.0000 [IU] | Freq: Three times a day (TID) | INTRAMUSCULAR | Status: DC
  Administered 2021-08-05: 1 [IU] via SUBCUTANEOUS
  Filled 2021-08-04: qty 1

## 2021-08-04 MED ORDER — MAGNESIUM HYDROXIDE 400 MG/5ML PO SUSP: 30.0000 mL | Freq: Every day | ORAL | Status: DC | PRN

## 2021-08-04 MED ORDER — ADULT MULTIVITAMIN W/MINERALS CH: 1.0000 | ORAL_TABLET | Freq: Every day | ORAL | Status: DC

## 2021-08-04 MED ORDER — ENOXAPARIN SODIUM 30 MG/0.3ML IJ SOSY: 30.0000 mg | PREFILLED_SYRINGE | INTRAMUSCULAR | Status: DC

## 2021-08-04 MED ORDER — DICLOFENAC SODIUM 1 % EX GEL: 2.0000 g | Freq: Two times a day (BID) | CUTANEOUS | Status: DC | PRN

## 2021-08-04 MED ORDER — EZETIMIBE 10 MG PO TABS
10.0000 mg | ORAL_TABLET | Freq: Every day | ORAL | Status: DC
  Administered 2021-08-04 – 2021-08-05 (×2): 10 mg via ORAL
  Filled 2021-08-04 (×2): qty 1

## 2021-08-04 MED ORDER — DEXTROSE 50 % IV SOLN
INTRAVENOUS | Status: AC
  Filled 2021-08-04: qty 50

## 2021-08-04 MED ORDER — FLUTICASONE FUROATE-VILANTEROL 100-25 MCG/ACT IN AEPB: 1.0000 | INHALATION_SPRAY | Freq: Every day | RESPIRATORY_TRACT | Status: DC

## 2021-08-04 MED ORDER — ASPIRIN 81 MG PO TBEC
81.0000 mg | DELAYED_RELEASE_TABLET | Freq: Every day | ORAL | Status: DC
  Administered 2021-08-04 – 2021-08-05 (×2): 81 mg via ORAL
  Filled 2021-08-04 (×2): qty 1

## 2021-08-04 MED ORDER — TRAZODONE HCL 50 MG PO TABS: 25.0000 mg | ORAL_TABLET | Freq: Every evening | ORAL | Status: DC | PRN

## 2021-08-04 MED ORDER — ONDANSETRON HCL 4 MG/2ML IJ SOLN: 4.0000 mg | Freq: Four times a day (QID) | INTRAMUSCULAR | Status: DC | PRN

## 2021-08-04 NOTE — Progress Notes (Signed)
[facility] PLAN OF CARE NOTE Patient: Theresa Booker PPH:[id number]   PCP: [facility] DOB: 19-Nov-1945   DOA: 08/03/2021   DOS: 08/04/2021    Patient was admitted by my colleague earlier on 08/04/2021. I have reviewed the H&P as well as assessment and plan and agree with the same. Important changes in the plan are listed below.  Plan of care: Principal Problem:   Metabolic encephalopathy Active Problems:   Acute kidney injury superimposed on chronic kidney disease (HCC)   Type 2 diabetes mellitus with chronic kidney disease, with long-term current use of insulin (HCC)   Depression Still hypoglycemic and somewhat dizzi with blurred vision.  MRI brian negative for stroke Check US renal Stop lantus, gabapentin. PTOT consult.  Give one time d50.   Author: Berle Mull, MD [facility] 08/04/2021 9:25 AM   If 7PM-7AM, please contact night-coverage at [email or web address]

## 2021-08-04 NOTE — Assessment & Plan Note (Signed)
-   We will continue Effexor XR. 

## 2021-08-04 NOTE — Progress Notes (Signed)
Inpatient Diabetes Program Recommendations  AACE/ADA: New Consensus Statement on Inpatient Glycemic Control (2015)  Target Ranges:  Prepandial:   less than 140 mg/dL      Peak postprandial:   less than 180 mg/dL (1-2 hours)      Critically ill patients:  140 - 180 mg/dL   Lab Results  Component Value Date   GLUCAP 158 (H) 08/04/2021   HGBA1C 12.9 (H) 02/15/2021    Review of Glycemic Control  Latest Reference Range & Units 08/04/21 08:42 08/04/21 11:20  Glucose-Capillary 70 - 99 mg/dL 59 (L) 158 (H)   Diabetes history: DM 2 Outpatient Diabetes medications:  Lantus 40 units daily Januvia 100 mg daily Current orders for Inpatient glycemic control:  Novolog sensitive tid with meals and HS  Inpatient Diabetes Program Recommendations:    Note low blood sugar this morning.  Patient has received no insulin since coming to the hospital, although she is on basal insulin at home. Currently basal insulin not ordered and A1C pending.  May need adjustment in home insulin?  Will follow.  Thanks, Adah Perl, RN, BC-ADM Inpatient Diabetes Coordinator Pager [phone]  (8a-5p)

## 2021-08-04 NOTE — Assessment & Plan Note (Signed)
-   She will be admitted to the medical telemetry bed. - Her encephalopathy could be related to acute kidney injury. - She will be hydrated with IV normal saline and will follow BMP. - We will follow potassium level that should improve with hydration as well. - Neurochecks will be followed.

## 2021-08-04 NOTE — Evaluation (Signed)
Physical Therapy Evaluation Patient Details Name: Theresa Booker MRN: [id number] DOB: Nov 13, 1945 Today's Date: 08/04/2021  History of Present Illness  Pt is a 76 y/o F admitted on 08/03/21 after presenting to the ED with c/o acute onset of AMS with confusion. Pt is being treated for metabolic encephalopathy. PMH: asthma, CKD 3b, DM2, fibromyalgia, dyslipidemia, HTN, depression, SDH  Clinical Impression  Pt seen for PT evaluation with pt reporting prior to admission she lives with her grandson who works 3rd shift ("odd hours") & when he is home he is primarily sleeping. Pt reports she lives in a 1 level home with 3 steps without rails to enter, ambulates with [facility] & endorses falling many times in the past 6 months. On this date, pt requires min assist HHA to ambulate 1 lap around nurses station. Pt is also only oriented to self, year, & hospital. Pt demonstrates LOB during gait. Due to hx of falling, impaired balance & limited assistance at home, recommending STR upon d/c.     Recommendations for follow up therapy are one component of a multi-disciplinary discharge planning process, led by the attending physician.  Recommendations may be updated based on patient status, additional functional criteria and insurance authorization.  Follow Up Recommendations Skilled nursing-short term rehab (<3 hours/day)    Assistance Recommended at Discharge Frequent or constant Supervision/Assistance  Patient can return home with the following  Assistance with cooking/housework;A little help with bathing/dressing/bathroom;A little help with walking and/or transfers;Direct supervision/assist for medications management;Direct supervision/assist for financial management;Help with stairs or ramp for entrance;Assist for transportation    Equipment Recommendations None recommended by PT  Recommendations for Other Services       Functional Status Assessment Patient has had a recent decline in their functional status and  demonstrates the ability to make significant improvements in function in a reasonable and predictable amount of time.     Precautions / Restrictions Precautions Precautions: Fall Restrictions Weight Bearing Restrictions: No      Mobility  Bed Mobility               General bed mobility comments: not observed, pt received & left sitting in recliner    Transfers Overall transfer level: Needs assistance Equipment used: None Transfers: Sit to/from Stand Sit to Stand: Supervision           General transfer comment: STS from recliner with supervision, pushes to stand with BUE on armrests    Ambulation/Gait Ambulation/Gait assistance: Min assist Gait Distance (Feet): 170 Feet Assistive device: 1 person hand held assist   Gait velocity: decreased     General Gait Details: Pt reports she ambulates with SPC at home so pt held to PT's hand to simulate SPC. Pt with several LOB upon standing & initiating gait, balance slowly improves with gait distance.  Stairs            Wheelchair Mobility    Modified Rankin (Stroke Patients Only)       Balance Overall balance assessment: Needs assistance Sitting-balance support: No upper extremity supported, Feet supported Sitting balance-Leahy Scale: Good     Standing balance support: During functional activity Standing balance-Leahy Scale: Poor                               Pertinent Vitals/Pain Pain Assessment Pain Assessment: Faces Faces Pain Scale: Hurts little more Pain Location: pain in BLE feet Pain Descriptors / Indicators: Burning Pain Intervention(s): Monitored during session  Home Living Family/patient expects to be discharged to:: Private residence Living Arrangements: Other relatives (grandson) Available Help at Discharge: Family;Available PRN/intermittently Type of Home: House Home Access: Stairs to enter Entrance Stairs-Rails: None Entrance Stairs-Number of Steps: 3   Home  Layout: One level Home Equipment: Shower seat;Cane - single Barista (2 wheels) Additional Comments: grandson works 3rd shift    Prior Function Prior Level of Function : Needs assist  Cognitive Assist : Mobility (cognitive)           Mobility Comments: Pt reports she primarily ambulates with SPC & endorses many falls over the past 6 months. ADLs Comments: IADL assist     Hand Dominance   Dominant Hand: Right    Extremity/Trunk Assessment   Upper Extremity Assessment Upper Extremity Assessment: Overall WFL for tasks assessed    Lower Extremity Assessment Lower Extremity Assessment: Generalized weakness       Communication   Communication: No difficulties  Cognition Arousal/Alertness: Awake/alert Behavior During Therapy: WFL for tasks assessed/performed Overall Cognitive Status: No family/caregiver present to determine baseline cognitive functioning                                 General Comments: Pt oriented to self & year (with extra time) but unable to recall situation, requires cuing to recall current city but aware she's in a hospital.        General Comments General comments (skin integrity, edema, etc.): SpO2 86% on RA with LB bathing, resolved to 100% with PLB    Exercises     Assessment/Plan    PT Assessment Patient needs continued PT services  PT Problem List Decreased strength;Decreased activity tolerance;Decreased balance;Decreased mobility;Decreased safety awareness;Decreased knowledge of use of DME       PT Treatment Interventions DME instruction;Gait training;Balance training;Manual techniques;Therapeutic exercise;Stair training;Neuromuscular re-education;Functional mobility training;Therapeutic activities;Patient/family education;Cognitive remediation    PT Goals (Current goals can be found in the Care Plan section)  Acute Rehab PT Goals Patient Stated Goal: go home PT Goal Formulation: With patient Time For Goal  Achievement: 08/18/21 Potential to Achieve Goals: Good    Frequency Min 2X/week     Co-evaluation               AM-PAC PT "6 Clicks" Mobility  Outcome Measure Help needed turning from your back to your side while in a flat bed without using bedrails?: None Help needed moving from lying on your back to sitting on the side of a flat bed without using bedrails?: A Little Help needed moving to and from a bed to a chair (including a wheelchair)?: A Little Help needed standing up from a chair using your arms (e.g., wheelchair or bedside chair)?: A Little Help needed to walk in hospital room?: A Little Help needed climbing 3-5 steps with a railing? : A Little 6 Click Score: 19    End of Session   Activity Tolerance: Patient tolerated treatment well Patient left: in chair;with chair alarm set;with call bell/phone within reach Nurse Communication: Mobility status PT Visit Diagnosis: Unsteadiness on feet (R26.81);History of falling (Z91.81);Muscle weakness (generalized) (M62.81)    Time: 6213-0865 PT Time Calculation (min) (ACUTE ONLY): 9 min   Charges:   PT Evaluation $PT Eval Low Complexity: [place], PT, DPT 08/04/21, 3:33 PM   Waunita Schooner 08/04/2021, 3:32 PM

## 2021-08-04 NOTE — Progress Notes (Signed)
Admission profile updated. ?

## 2021-08-04 NOTE — ED Notes (Signed)
Assumed care of patient at this time. Placed on hospital bed. Brief changed, pericare performed and external female catheter applied. NSR on monitor, respirations even, unlabored on RA. Denies pain. Patient oriented to name only. Unable to correctly state place, time or situation. States, "I need to bring my granddaughters to school." Reoriented to place and situation. Patient with clear speech and moves all extremities. No facial droop noted. Grandson at bedside. Bed alarm on, bed in low position with siderails up and call light in reach. Denies needs. No distress noted at this time.

## 2021-08-04 NOTE — Plan of Care (Signed)

## 2021-08-04 NOTE — ED Notes (Signed)
Informed RN bed assigned 

## 2021-08-04 NOTE — Progress Notes (Signed)
PHARMACIST - PHYSICIAN COMMUNICATION  CONCERNING:  Enoxaparin (Lovenox) for DVT Prophylaxis    RECOMMENDATION: Patient was prescribed enoxaprin 40mg  q24 hours for VTE prophylaxis.   Filed Weights   08/03/21 2233  Weight: 77.1 kg (170 lb)    Body mass index is 33.2 kg/m.  Estimated Creatinine Clearance: 15.9 mL/min (A) (by C-G formula based on SCr of 2.72 mg/dL (H)).  Patient is candidate for enoxaparin 30mg  every 24 hours based on CrCl <93ml/min or Weight <45kg  DESCRIPTION: Pharmacy has adjusted enoxaparin dose per [facility] policy.  Patient is now receiving enoxaparin 30 mg every 24 hours.   Renda Rolls, PharmD, [facility] 08/04/2021 3:58 AM

## 2021-08-04 NOTE — Assessment & Plan Note (Addendum)
-   She will be placed on supplement coverage with NovoLog. - We will continue basal coverage and Januvia. - We will continue Neurontin for diabetic neuropathy.

## 2021-08-04 NOTE — H&P (Signed)
[place]   PATIENT NAME: Theresa Booker    MR#:  [id number]  DATE OF BIRTH:  1945/12/01  DATE OF ADMISSION:  08/03/2021  PRIMARY CARE PHYSICIAN: [facility] [place]   Patient is coming from: Home  REQUESTING/REFERRING PHYSICIAN: Hinda Kehr, MD  CHIEF COMPLAINT:   Chief Complaint  Patient presents with   Altered Mental Status    HISTORY OF PRESENT ILLNESS:  Theresa Booker is a 76 y.o. African-American female with medical history significant for asthma, stage IIIb chronic kidney disease, type 2 diabetes mellitus, fibromyalgia, dyslipidemia and hypertension, who presented to the emergency room with acute onset of altered mental status with confusion.  The patient has not been acting herself over the last few days per her grandson.  Yesterday she was sitting on the couch with her knees.  She has been having gait instability lately and admits to headache.  She denies any paresthesias or focal muscle weakness.  She has been having diminished p.o. intake.  No fever or chills.  No cough or wheezing or dyspnea.  No chest pain or palpitations.  No reported nausea or vomiting or diarrhea.  ED Course: When she came to the ER, vital signs were within normal.  Labs revealed a BUN of 66 with a creatinine of 2.72 above previous levels and potassium 5.3 with albumin 3.4.  CBC showed mild anemia.  UA was unremarkable. EKG as reviewed by me : EKG showed sinus rhythm with a rate of 83 with short PR interval Imaging: Noncontrasted CT scan revealed no acute intracranial abnormalities and brain MRI showed mild chronic microvascular ischemic disease with no acute tracking abnormality.  The patient was given 1 L bolus of IV lactated Ringer.  She will be admitted to the medical telemetry bed for further evaluation and management. PAST MEDICAL HISTORY:   Past Medical History:  Diagnosis Date   Asthma    Atypical chest pain    neg ETT at [facility]   CKD (chronic kidney disease), stage III  ([place])    Depression    Diabetes mellitus without complication ([place])    Fibromyalgia    HLD (hyperlipidemia)    Hypertension    Vitamin D deficiency     PAST SURGICAL HISTORY:   Past Surgical History:  Procedure Laterality Date   ABDOMINAL HYSTERECTOMY     COLONOSCOPY WITH PROPOFOL N/A 05/22/2016   Procedure: COLONOSCOPY WITH PROPOFOL;  Surgeon: Lollie Sails, MD;  Location: [facility] ENDOSCOPY;  Service: Endoscopy;  Laterality: N/A;   ESOPHAGOGASTRODUODENOSCOPY (EGD) WITH PROPOFOL N/A 05/22/2016   Procedure: ESOPHAGOGASTRODUODENOSCOPY (EGD) WITH PROPOFOL;  Surgeon: Lollie Sails, MD;  Location: [facility] ENDOSCOPY;  Service: Endoscopy;  Laterality: N/A;   HAND SURGERY Right 2014   Dr Rudene Christians   rotator cuff surgery Left 03/2013   DR Rudene Christians   SHOULDER ARTHROSCOPY WITH OPEN ROTATOR CUFF REPAIR Right 04/25/2017   Procedure: SHOULDER ARTHROSCOPY WITH OPEN ROTATOR CUFF REPAIR;  Surgeon: Corky Mull, MD;  Location: [facility] ORS;  Service: Orthopedics;  Laterality: Right;   SHOULDER ARTHROSCOPY WITH OPEN ROTATOR CUFF REPAIR Right 03/11/2018   Procedure: SHOULDER ARTHROSCOPY WITH Recurrent  OPEN ROTATOR CUFF REPAIR;  Surgeon: Corky Mull, MD;  Location: [facility] ORS;  Service: Orthopedics;  Laterality: Right;  debridment and decompression    SOCIAL HISTORY:   Social History   Tobacco Use   Smoking status: Never   Smokeless tobacco: Never  Substance Use Topics   Alcohol use: No    Alcohol/week:  0.0 standard drinks    FAMILY HISTORY:   Family History  Problem Relation Age of Onset   Diabetes Mother    Breast cancer Mother 25   Cancer Sister    Cancer Father    Liver disease Brother    Breast cancer Maternal Aunt    Breast cancer Cousin        maternal    DRUG ALLERGIES:   Allergies  Allergen Reactions   Ace Inhibitors Other (See Comments) and Cough    Constant persistent cough   Pollen Extracts [Pollen Extract] Other (See Comments)    Sneezing/watery eyes/itchy eye   Vicodin  [Hydrocodone-Acetaminophen] Nausea And Vomiting    REVIEW OF SYSTEMS:   ROS As per history of present illness. All pertinent systems were reviewed above. Constitutional, HEENT, cardiovascular, respiratory, GI, GU, musculoskeletal, neuro, psychiatric, endocrine, integumentary and hematologic systems were reviewed and are otherwise negative/unremarkable except for positive findings mentioned above in the HPI.   MEDICATIONS AT HOME:   Prior to Admission medications   Medication Sig Start Date End Date Taking? Authorizing Provider  aspirin EC 81 MG tablet Take 81 mg by mouth daily. 03/02/20  Yes [provider]  Cholecalciferol (VITAMIN D3) 50 MCG (2000 UT) TABS Take 2,000 Units by mouth daily.   Yes [provider]  diclofenac sodium (VOLTAREN) 1 % GEL Apply 2 g topically 2 (two) times daily as needed. Patient taking differently: Apply 2 g topically 2 (two) times daily as needed (for pain). 09/30/17  Yes Merlyn Lot, MD  ezetimibe (ZETIA) 10 MG tablet Take 10 mg by mouth daily. 06/06/20  Yes [provider]  FEROSUL 325 (65 Fe) MG tablet Take 325 mg by mouth daily with breakfast. 07/20/21  Yes [provider]  fluticasone furoate-vilanterol (BREO ELLIPTA) 100-25 MCG/INH AEPB Inhale 1 puff into the lungs daily. 01/22/19  Yes [provider]  furosemide (LASIX) 40 MG tablet Take 40 mg by mouth daily.   Yes [provider]  gabapentin (NEURONTIN) 300 MG capsule Take by mouth 3 (three) times daily. 04/02/16  Yes [provider]  hydrochlorothiazide (HYDRODIURIL) 25 MG tablet Take 25 mg by mouth daily.    Yes [provider]  insulin glargine (LANTUS) 100 UNIT/ML Solostar Pen Inject 40 Units into the skin daily.   Yes [provider]  isosorbide mononitrate (IMDUR) 30 MG 24 hr tablet Take 30 mg by mouth daily.   Yes [provider]  loratadine (CLARITIN) 10 MG tablet Take 10 mg by mouth daily.   Yes [provider]  losartan (COZAAR) 25 MG tablet Take 25 mg by mouth daily.   Yes [provider]  Multiple Vitamin (MULTIVITAMIN WITH MINERALS) TABS tablet Take 1 tablet by mouth daily. 07/31/18  Yes Salary, Avel Peace, MD  nortriptyline (PAMELOR) 50 MG capsule Take 50 mg by mouth at bedtime.   Yes [provider]  omeprazole (PRILOSEC) 20 MG capsule Take 20 mg by mouth daily.    Yes [provider]  sitaGLIPtin (JANUVIA) 100 MG tablet Take 100 mg by mouth daily.   Yes [provider]  tiotropium (SPIRIVA) 18 MCG inhalation capsule Place 18 mcg into inhaler and inhale daily.   Yes [provider]  venlafaxine XR (EFFEXOR-XR) 75 MG 24 hr capsule Take 225 mg by mouth daily with breakfast.   Yes [provider]  acetaminophen (TYLENOL) 500 MG tablet Take 500-1,000 mg by mouth every 6 (six) hours as needed for mild pain or  headache.     [provider]  albuterol (VENTOLIN HFA) 108 (90 Base) MCG/ACT inhaler Inhale 2 puffs into the lungs every 6 (six) hours as needed for wheezing or shortness of breath.     [provider]  blood glucose meter kit and supplies KIT Dispense based on patient and insurance preference. Use up to four times daily as directed. (FOR ICD-9 250.00, 250.01). 08/11/18   Epifanio Lesches, MD      VITAL SIGNS:  Blood pressure 137/73, pulse (!) 54, temperature 98.3 F (36.8 C), temperature source Oral, resp. rate 19, height 5' (1.524 m), weight 77.1 kg, SpO2 99 %.  PHYSICAL EXAMINATION:  Physical Exam  GENERAL:  76 y.o.-year-old African-American female patient lying in the bed with no acute distress.  EYES: Pupils equal, round, reactive to light and accommodation. No scleral icterus. Extraocular muscles intact.  HEENT: Head atraumatic, normocephalic. Oropharynx with slightly dry mucous membranes and tongue and nasopharynx clear.  NECK:  Supple, no jugular venous distention. No thyroid enlargement, no  tenderness.  LUNGS: Normal breath sounds bilaterally, no wheezing, rales,rhonchi or crepitation. No use of accessory muscles of respiration.  CARDIOVASCULAR: Regular rate and rhythm, S1, S2 normal. No murmurs, rubs, or gallops.  ABDOMEN: Soft, nondistended, nontender. Bowel sounds present. No organomegaly or mass.  EXTREMITIES: No pedal edema, cyanosis, or clubbing.  NEUROLOGIC: Cranial nerves II through XII are intact. Muscle strength 5/5 in all extremities. Sensation intact. Gait not checked.  PSYCHIATRIC: The patient is somnolent and difficult to arouse.  She has not responded to questions.   SKIN: No obvious rash, lesion, or ulcer.   LABORATORY PANEL:   CBC Recent Labs  Lab 08/04/21 0516  WBC 7.2  HGB 9.8*  HCT 30.9*  PLT 265   ------------------------------------------------------------------------------------------------------------------  Chemistries  Recent Labs  Lab 08/03/21 2237 08/04/21 0516  NA 139 140  K 5.3* 4.3  CL 110 112*  CO2 25 25  GLUCOSE 233* 95  BUN 66* 56*  CREATININE 2.72* 2.20*  CALCIUM 9.1 8.9  AST 19  --   ALT 11  --   ALKPHOS 91  --   BILITOT 0.5  --    ------------------------------------------------------------------------------------------------------------------  Cardiac Enzymes No results for input(s): TROPONINI in the last 168 hours. ------------------------------------------------------------------------------------------------------------------  RADIOLOGY:  CT Head Wo Contrast  Result Date: 08/03/2021 CLINICAL DATA:  Headache and altered mental status. EXAM: CT HEAD WITHOUT CONTRAST TECHNIQUE: Contiguous axial images were obtained from the base of the skull through the vertex without intravenous contrast. RADIATION DOSE REDUCTION: This exam was performed according to the departmental dose-optimization program which includes automated exposure control, adjustment of the mA and/or kV according to patient size and/or use of iterative  reconstruction technique. COMPARISON:  August 07, 2018 FINDINGS: Brain: There is mild cerebral atrophy with widening of the extra-axial spaces and ventricular dilatation. There are areas of decreased attenuation within the white matter tracts of the supratentorial brain, consistent with microvascular disease changes. Vascular: No hyperdense vessel or unexpected calcification. Skull: Normal. Negative for fracture or focal lesion. Sinuses/Orbits: No acute finding. Other: None. IMPRESSION: No acute intracranial abnormality. Electronically Signed   By: Virgina Norfolk M.D.   On: 08/03/2021 23:01   MR BRAIN WO CONTRAST  Result Date: 08/04/2021 CLINICAL DATA:  Initial evaluation for acute vertigo. EXAM: MRI HEAD WITHOUT CONTRAST TECHNIQUE: Multiplanar, multiecho pulse sequences of the brain and surrounding structures were obtained without intravenous contrast. COMPARISON:  Prior CT from 08/03/2021. FINDINGS: Brain: Examination mildly degraded by motion artifact. Cerebral  volume within normal limits for age. Scattered patchy T2/FLAIR hyperintensity involving the periventricular and deep white matter both cerebral hemispheres as well as the pons, most consistent with chronic small vessel ischemic disease, mild in nature. No evidence for acute or subacute infarct. Gray-white matter differentiation maintained. No areas of chronic cortical infarction. No acute or chronic intracranial blood products. No mass lesion, midline shift or mass effect. No hydrocephalus or extra-axial fluid collection. Pituitary gland suprasellar region within normal limits. Vascular: Major intracranial vascular flow voids are maintained. Skull and upper cervical spine: Craniocervical junction within normal limits. Bone marrow signal intensity normal. No scalp soft tissue abnormality. Sinuses/Orbits: Globes and orbital soft tissues within normal limits. Paranasal sinuses are largely clear. No mastoid effusion. Other: None. IMPRESSION: 1. No acute  intracranial abnormality. 2. Mild chronic microvascular ischemic disease. Electronically Signed   By: Jeannine Boga M.D.   On: 08/04/2021 01:29      IMPRESSION AND PLAN:  Assessment and Plan: * Metabolic encephalopathy - She will be admitted to the medical telemetry bed. - Her encephalopathy could be related to acute kidney injury. - She will be hydrated with IV normal saline and will follow BMP. - We will follow potassium level that should improve with hydration as well. - Neurochecks will be followed.  Acute kidney injury superimposed on chronic kidney disease ([place]) - She will be hydrated with IV normal saline. - We will follow BMPs. - We will avoid nephrotoxins.  Type 2 diabetes mellitus with chronic kidney disease, with long-term current use of insulin ([place]) - She will be placed on supplement coverage with NovoLog. - We will continue basal coverage and Januvia. - We will continue Neurontin for diabetic neuropathy.  Depression - We will continue Effexor XR.   DVT prophylaxis: Lovenox.  Advanced Care Planning:  Code Status: full code.  Family Communication:  The plan of care was discussed in details with the patient (and family). I answered all questions. The patient agreed to proceed with the above mentioned plan. Further management will depend upon hospital course. Disposition Plan: Back to previous home environment Consults called: none.  All the records are reviewed and case discussed with ED provider.  Status is: Inpatient  At the time of the admission, it appears that the appropriate admission status for this patient is inpatient.  This is judged to be reasonable and necessary in order to provide the required intensity of service to ensure the patient's safety given the presenting symptoms, physical exam findings and initial radiographic and laboratory data in the context of comorbid conditions.  The patient requires inpatient status due to high intensity of  service, high risk of further deterioration and high frequency of surveillance required.  I certify that at the time of admission, it is my clinical judgment that the patient will require inpatient hospital care extending more than 2 midnights.                            Dispo: The patient is from: Home              Anticipated d/c is to: Home              Patient currently is not medically stable to d/c.              Difficult to place patient: No  Christel Mormon M.D on 08/04/2021 at 8:08 AM  [facility]   From 7 PM-7 AM, contact  night-coverage [email or web address]  CC: Primary care physician; [facility]

## 2021-08-04 NOTE — Evaluation (Signed)
Occupational Therapy Evaluation Patient Details Name: Theresa Booker MRN: [id number] DOB: 1945-05-21 Today's Date: 08/04/2021   History of Present Illness Theresa Booker is a 76 y.o. African-American female with medical history significant for asthma, stage IIIb chronic kidney disease, type 2 diabetes mellitus, fibromyalgia, dyslipidemia and hypertension, who presented to the emergency room with acute onset of altered mental status with confusion.   Clinical Impression   Pt was seen for OT evaluation this date. Prior to hospital admission, pt reported she primarily ambulates with [facility] & endorses many falls over the past 6 months, required IADL assist and assist getting in/out of shower. Pt lives with grandson in a one level house. Pt reported grandson works and is not able to provide assistance. Pt presents to acute OT demonstrating impaired ADL performance and functional mobility 2/2 decreased activity tolerance and functional strength/ROM/balance deficits. Pt completed O-Log scoring 21/30 requires logical cues for city, situation, and all elements of time.  Pt currently requires SBA for safety for upper body and lower body bathing in standing - lack of insight into LOB/SOB. CGA for ADL t/f - multiple minor LOBs with no insight or recall of near falls. MOD I for donning/doffing bra and hospital gown in sitting - needs extra time, SOB. MIN A don mesh underwear in sitting. Pt would benefit from skilled OT to address noted impairments and functional limitations (see below for any additional details). Upon hospital discharge, recommend STR to maximize pt safety and return to PLOF.      Recommendations for follow up therapy are one component of a multi-disciplinary discharge planning process, led by the attending physician.  Recommendations may be updated based on patient status, additional functional criteria and insurance authorization.   Follow Up Recommendations  Skilled nursing-short term rehab (<3  hours/day)    Assistance Recommended at Discharge Intermittent Supervision/Assistance  Patient can return home with the following A little help with walking and/or transfers;A little help with bathing/dressing/bathroom;Assistance with cooking/housework;Assist for transportation    Functional Status Assessment  Patient has had a recent decline in their functional status and demonstrates the ability to make significant improvements in function in a reasonable and predictable amount of time.  Equipment Recommendations  None recommended by OT    Recommendations for Other Services       Precautions / Restrictions Precautions Precautions: Fall Restrictions Weight Bearing Restrictions: No      Mobility Bed Mobility Overal bed mobility: Modified Independent                  Transfers Overall transfer level: Needs assistance Equipment used: None Transfers: Sit to/from Stand Sit to Stand: Min guard                  Balance Overall balance assessment: Needs assistance Sitting-balance support: No upper extremity supported, Feet supported Sitting balance-Leahy Scale: Normal     Standing balance support: No upper extremity supported, During functional activity Standing balance-Leahy Scale: Fair Standing balance comment: minor LOBs when looking up                           ADL either performed or assessed with clinical judgement   ADL Overall ADL's : Needs assistance/impaired                                       General ADL Comments: SBA  for safety for upper body and lower body bathing in standing - lack of insight into LOB/SOB. CGA for ADL t/f - multiple minor LOBs with no insight or recall. MOD I for donning/doffing bra and hospital gown in sitting - needs extra time, SOB. MIN A don mesh underwear in sitting.       Hand Dominance Right   Extremity/Trunk Assessment Upper Extremity Assessment Upper Extremity Assessment: Overall WFL  for tasks assessed   Lower Extremity Assessment Lower Extremity Assessment: Generalized weakness       Communication Communication Communication: No difficulties;Other (comment) (garbled speech intermittently)   Cognition Arousal/Alertness: Awake/alert Behavior During Therapy: WFL for tasks assessed/performed Overall Cognitive Status: Within Functional Limits for tasks assessed                                 General Comments: O-Log 21/30 requires logical cues for city, situation, and all elements of time.     General Comments  SpO2 86% on RA with LB bathing, resolved to 100% with PLB    Exercises     Shoulder Instructions      Home Living Family/patient expects to be discharged to:: Private residence Living Arrangements: Other relatives Available Help at Discharge: Family;Available PRN/intermittently Type of Home: House Home Access: Stairs to enter [employer] of Steps: 3 Entrance Stairs-Rails: None Home Layout: One level     Bathroom Shower/Tub: Teacher, early years/pre: Standard     Home Equipment: Marine scientist - single point;Hand held shower head   Additional Comments: grandson works 3rd shift      Prior Functioning/Environment Prior Level of Function : Needs assist  Cognitive Assist : Mobility (cognitive)           Mobility Comments: SPC as needed ADLs Comments: IADL assist and assist getting in/out of shower        OT Problem List: Decreased activity tolerance;Decreased cognition;Decreased safety awareness;Impaired balance (sitting and/or standing)      OT Treatment/Interventions: Self-care/ADL training;Energy conservation;DME and/or AE instruction;Therapeutic activities;Patient/family education;Balance training;Therapeutic exercise    OT Goals(Current goals can be found in the care plan section) Acute Rehab OT Goals Patient Stated Goal: To go home OT Goal Formulation: With patient Time For Goal  Achievement: 08/18/21 Potential to Achieve Goals: Good ADL Goals Pt Will Perform Grooming: Independently;standing Pt Will Transfer to Toilet: Independently;ambulating;regular height toilet Additional ADL Goal #1: Pt will independetly verbalize plan to implement x3 falls prevention strategies  OT Frequency: Min 2X/week    Co-evaluation              AM-PAC OT "6 Clicks" Daily Activity     Outcome Measure Help from another person eating meals?: None Help from another person taking care of personal grooming?: A Little Help from another person toileting, which includes using toliet, bedpan, or urinal?: A Little Help from another person bathing (including washing, rinsing, drying)?: A Little Help from another person to put on and taking off regular upper body clothing?: None Help from another person to put on and taking off regular lower body clothing?: A Little 6 Click Score: 20   End of Session Equipment Utilized During Treatment: Gait belt;Rolling walker (2 wheels)  Activity Tolerance: Patient tolerated treatment well Patient left: in chair;with call bell/phone within reach;with chair alarm set  OT Visit Diagnosis: History of falling (Z91.81)                Time:  4314-2767 OT Time Calculation (min): 54 min Charges:  OT General Charges $OT Visit: 1 Visit OT Evaluation $OT Eval Moderate Complexity: 1 Mod OT Treatments $Self Care/Home Management : 38-52 mins [employer], OTDS  [employer] 08/04/2021, 3:47 PM

## 2021-08-04 NOTE — Assessment & Plan Note (Signed)
-   She will be hydrated with IV normal saline. - We will follow BMPs. - We will avoid nephrotoxins.

## 2021-08-05 LAB — BASIC METABOLIC PANEL
Anion gap: 4 — ABNORMAL LOW (ref 5–15)
BUN: 34 mg/dL — ABNORMAL HIGH (ref 8–23)
CO2: 27 mmol/L (ref 22–32)
Calcium: 8.8 mg/dL — ABNORMAL LOW (ref 8.9–10.3)
Chloride: 107 mmol/L (ref 98–111)
Creatinine, Ser: 1.55 mg/dL — ABNORMAL HIGH (ref 0.44–1.00)
GFR, Estimated: 34 mL/min — ABNORMAL LOW (ref >60)
Glucose, Bld: 125 mg/dL — ABNORMAL HIGH (ref 70–99)
Potassium: 5 mmol/L (ref 3.5–5.1)
Sodium: 138 mmol/L (ref 135–145)

## 2021-08-05 LAB — GLUCOSE, CAPILLARY
Glucose-Capillary: 117 mg/dL — ABNORMAL HIGH (ref 70–99)
Glucose-Capillary: 138 mg/dL — ABNORMAL HIGH (ref 70–99)

## 2021-08-05 MED ORDER — INSULIN GLARGINE 100 UNIT/ML SOLOSTAR PEN: 20.0000 [IU] | PEN_INJECTOR | Freq: Every day | SUBCUTANEOUS | 11 refills | Status: DC

## 2021-08-05 MED ORDER — FUROSEMIDE 40 MG PO TABS: 40.0000 mg | ORAL_TABLET | ORAL | 0 refills | Status: AC

## 2021-08-05 NOTE — TOC Transition Note (Signed)
Transition of Care [facility]) - CM/SW Discharge Note   Patient Details  Name: Theresa Booker MRN: [id number] Date of Birth: April 20, 1945  [facility] CM/SW Contact:  Izola Price, RN Phone Number: 08/05/2021, 1:34 PM   Clinical Narrative:  Plan changed to discharge to home with [facility] services via [facility]. Spoke with patient and grandson, where she lives. Patient has  RW and cane at home. Grandson works from 3 pm to 12 midnight. Another grandson is staying with them and will be present in the home during that period. Patient room and bathroom on ground level. Grandson, Theresa Booker, will pick patient up and provide transport home. Patient attends [facility] for PCP and RX. Updated provider and unit RN.   Williams,Theresa Grandson     [phone]     Simmie Davies RN CM   Final next level of care: Home w Home Health Services Barriers to Discharge: Barriers Resolved   Patient Goals and CMS Choice     Choice offered to / list presented to : Patient  Discharge Placement                  Name of family member notified: Theresa Booker Patient and family notified of of transfer: 08/05/21  Discharge Plan and Services                DME Arranged: N/A (Patient has RW and cane at home) DME Agency: NA       HH Arranged: RN, PT, OT HH Agency: [place] Date [facility] Agency Contacted: 08/05/21 Time [place]: [phone] Representative spoke with at [place]: [place] Determinants of Health ([place]) Interventions     Readmission Risk Interventions     View : No data to display.

## 2021-08-05 NOTE — Progress Notes (Signed)
Patient declined SNF and stated she would like home health instead. Malachy Mood with [facility] has agreed to take patient for PT,OT, and RN.

## 2021-08-07 NOTE — Discharge Summary (Signed)
Physician Discharge Summary   Patient: Theresa Booker MRN: [id number] DOB: 09-08-1945  Admit date:     08/03/2021  Discharge date: 08/05/2021  Discharge Physician: Berle Mull  PCP: [facility]  Recommendations at discharge: Follow-up with PCP in 1 week.  Discharge Diagnoses: Principal Problem:   Metabolic encephalopathy Active Problems:   Acute kidney injury superimposed on chronic kidney disease (HCC)   Type 2 diabetes mellitus with chronic kidney disease, with long-term current use of insulin (HCC)   Depression  Assessment and Plan: Acute metabolic encephalopathy Most likely in the setting of AKI and dehydration. Currently being treated with IV fluids. Mentation improved and back to baseline.  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 GFR of 19-29 at baseline. Currently improving with IV hydration and holding nephrotoxic medication. Monitor.  Type 2 diabetes mellitus with chronic kidney disease, with long-term current use of insulin ([place]) Continuing home regimen. Continue gabapentin.  Depression Continue home regimen.  Essential hypertension. Patient blood pressure is relatively stable. We will continue to hold losartan and HCTZ when on discharge. Continue Imdur and Lasix. Outpatient follow-up with PCP.  Obesity. Body mass index is 33.2 kg/m.  Placing the pt at higher risk of poor outcomes.  Consultants: None Procedures performed:  None DISCHARGE MEDICATION: Allergies as of 08/05/2021       Reactions   Ace Inhibitors Other (See Comments), Cough   Constant persistent cough   Pollen Extracts [pollen Extract] Other (See Comments)   Sneezing/watery eyes/itchy eye   Vicodin [hydrocodone-acetaminophen] Nausea And Vomiting        Medication List     STOP taking these medications    diclofenac sodium 1 % Gel Commonly known as: VOLTAREN   hydrochlorothiazide  25 MG tablet Commonly known as: HYDRODIURIL   losartan 25 MG tablet Commonly known as: COZAAR       TAKE these medications    acetaminophen 500 MG tablet Commonly known as: TYLENOL Take 500-1,000 mg by mouth every 6 (six) hours as needed for mild pain or headache.   albuterol 108 (90 Base) MCG/ACT inhaler Commonly known as: VENTOLIN HFA Inhale 2 puffs into the lungs every 6 (six) hours as needed for wheezing or shortness of breath.   aspirin EC 81 MG tablet Take 81 mg by mouth daily.   blood glucose meter kit and supplies Kit Dispense based on patient and insurance preference. Use up to four times daily as directed. (FOR ICD-9 250.00, 250.01).   ezetimibe 10 MG tablet Commonly known as: ZETIA Take 10 mg by mouth daily.   FeroSul 325 (65 FE) MG tablet Generic drug: ferrous sulfate Take 325 mg by mouth daily with breakfast.   fluticasone furoate-vilanterol 100-25 MCG/INH Aepb Commonly known as: BREO ELLIPTA Inhale 1 puff into the lungs daily.   furosemide 40 MG tablet Commonly known as: LASIX Take 1 tablet (40 mg total) by mouth 3 (three) times a week. What changed: when to take this   gabapentin 300 MG capsule Commonly known as: NEURONTIN Take by mouth 3 (three) times daily.   insulin glargine 100 UNIT/ML Solostar Pen Commonly known as: LANTUS Inject 20 Units into the skin daily. What changed: how much to take   isosorbide mononitrate 30 MG 24 hr tablet Commonly known as: IMDUR Take 30 mg by mouth daily.   loratadine 10 MG tablet Commonly known as: CLARITIN Take 10 mg by mouth daily.   multivitamin with  minerals Tabs tablet Take 1 tablet by mouth daily.   nortriptyline 50 MG capsule Commonly known as: PAMELOR Take 50 mg by mouth at bedtime.   omeprazole 20 MG capsule Commonly known as: PRILOSEC Take 20 mg by mouth daily.   sitaGLIPtin 100 MG tablet Commonly known as: JANUVIA Take 100 mg by mouth daily.   tiotropium 18 MCG inhalation  capsule Commonly known as: SPIRIVA Place 18 mcg into inhaler and inhale daily.   venlafaxine XR 75 MG 24 hr capsule Commonly known as: EFFEXOR-XR Take 225 mg by mouth daily with breakfast.   Vitamin D3 50 MCG (2000 UT) Tabs Take 2,000 Units by mouth daily.        Follow-up [place]. Schedule an appointment as soon as possible for a visit in 1 week(s).   Specialty: General Practice Contact information: [address] [place] [address] [phone]                Disposition: Home Diet recommendation: Cardiac diet  Discharge Exam: Vitals:   08/04/21 2156 08/05/21 0040 08/05/21 0437 08/05/21 0752  BP: 108/61 125/71 130/74 (!) 144/88  Pulse: 64 67 69 72  Resp: _0 Temp: 98.2 F (36.8 C) 98.1 F (36.7 C) 98.2 F (36.8 C) 97.7 F (36.5 C)  TempSrc: Oral   Oral  SpO2: 97% 98% 98% 97%  Weight:      Height:       General: Appear in no distress; no visible Abnormal Neck Mass Or lumps, Conjunctiva normal Cardiovascular: S1 and S2 Present, no Murmur, Respiratory: good respiratory effort, Bilateral Air entry present and CTA, no Crackles, no wheezes Abdomen: Bowel Sound present, Non tender  Extremities: no Pedal edema Neurology: alert and oriented to time, place, and person  Gait not checked due to patient safety concerns Filed Weights   08/03/21 2233  Weight: 77.1 kg   Condition at discharge: stable  The results of significant diagnostics from this hospitalization (including imaging, microbiology, ancillary and laboratory) are listed below for reference.   Imaging Studies: CT Head Wo Contrast  Result Date: 08/03/2021 CLINICAL DATA:  Headache and altered mental status. EXAM: CT HEAD WITHOUT CONTRAST TECHNIQUE: Contiguous axial images were obtained from the base of the skull through the vertex without intravenous contrast. RADIATION DOSE REDUCTION: This exam was performed according to the departmental  dose-optimization program which includes automated exposure control, adjustment of the mA and/or kV according to patient size and/or use of iterative reconstruction technique. COMPARISON:  August 07, 2018 FINDINGS: Brain: There is mild cerebral atrophy with widening of the extra-axial spaces and ventricular dilatation. There are areas of decreased attenuation within the white matter tracts of the supratentorial brain, consistent with microvascular disease changes. Vascular: No hyperdense vessel or unexpected calcification. Skull: Normal. Negative for fracture or focal lesion. Sinuses/Orbits: No acute finding. Other: None. IMPRESSION: No acute intracranial abnormality. Electronically Signed   By: Virgina Norfolk M.D.   On: 08/03/2021 23:01   MR BRAIN WO CONTRAST  Result Date: 08/04/2021 CLINICAL DATA:  Initial evaluation for acute vertigo. EXAM: MRI HEAD WITHOUT CONTRAST TECHNIQUE: Multiplanar, multiecho pulse sequences of the brain and surrounding structures were obtained without intravenous contrast. COMPARISON:  Prior CT from 08/03/2021. FINDINGS: Brain: Examination mildly degraded by motion artifact. Cerebral volume within normal limits for age. Scattered patchy T2/FLAIR hyperintensity involving the periventricular and deep white matter both cerebral hemispheres as well as the pons, most consistent with chronic small  vessel ischemic disease, mild in nature. No evidence for acute or subacute infarct. Gray-white matter differentiation maintained. No areas of chronic cortical infarction. No acute or chronic intracranial blood products. No mass lesion, midline shift or mass effect. No hydrocephalus or extra-axial fluid collection. Pituitary gland suprasellar region within normal limits. Vascular: Major intracranial vascular flow voids are maintained. Skull and upper cervical spine: Craniocervical junction within normal limits. Bone marrow signal intensity normal. No scalp soft tissue abnormality. Sinuses/Orbits:  Globes and orbital soft tissues within normal limits. Paranasal sinuses are largely clear. No mastoid effusion. Other: None. IMPRESSION: 1. No acute intracranial abnormality. 2. Mild chronic microvascular ischemic disease. Electronically Signed   By: Jeannine Boga M.D.   On: 08/04/2021 01:29   US RENAL  Result Date: 08/04/2021 CLINICAL DATA:  Acute kidney injury. EXAM: RENAL / URINARY TRACT ULTRASOUND COMPLETE COMPARISON:  None Available. FINDINGS: Right Kidney: Renal measurements: 11.8 x 4.5 x 4.7 cm = volume: 130 mL. Normal parenchymal echogenicity. Lower pole cyst, 1.7 x 1.5 x 1.8 cm. Upper pole cyst 1.6 x 1.0 x 1.5 cm. No other masses, no stones and no hydronephrosis. Left Kidney: Renal measurements: 10.6 x 5.1 x 5.0 cm = volume: 141 mL. Normal parenchymal echogenicity. Mild hydronephrosis. No mass or stone. Bladder: Appears normal for degree of bladder distention. No ureteral jets visualized. Other: None. IMPRESSION: 1. Mild left hydronephrosis of unclear etiology. Consider a ureteral obstruction if there are consistent clinical findings. 2. Two simple appearing right renal cysts. 3. No other abnormalities. Electronically Signed   By: Lajean Manes M.D.   On: 08/04/2021 10:20    Microbiology: Results for orders placed or performed during the hospital encounter of 02/15/21  Resp Panel by RT-PCR (Flu A&B, Covid) Nasopharyngeal Swab     Status: None   Collection Time: 02/14/21  7:56 PM   Specimen: Nasopharyngeal Swab; Nasopharyngeal(NP) swabs in vial transport medium  Result Value Ref Range Status   SARS Coronavirus 2 by RT PCR NEGATIVE NEGATIVE Final    Comment: (NOTE) SARS-CoV-2 target nucleic acids are NOT DETECTED.  The SARS-CoV-2 RNA is generally detectable in upper respiratory specimens during the acute phase of infection. The lowest concentration of SARS-CoV-2 viral copies this assay can detect is 138 copies/mL. A negative result does not preclude SARS-Cov-2 infection and should  not be used as the sole basis for treatment or other patient management decisions. A negative result may occur with  improper specimen collection/handling, submission of specimen other than nasopharyngeal swab, presence of viral mutation(s) within the areas targeted by this assay, and inadequate number of viral copies(<138 copies/mL). A negative result must be combined with clinical observations, patient history, and epidemiological information. The expected result is Negative.  Fact Sheet for Patients:  EntrepreneurPulse.com.au  Fact Sheet for Healthcare Providers:  IncredibleEmployment.be  This test is no t yet approved or cleared by the [place] FDA and  has been authorized for detection and/or diagnosis of SARS-CoV-2 by FDA under an Emergency Use Authorization (EUA). This EUA will remain  in effect (meaning this test can be used) for the duration of the COVID-19 declaration under Section 564(b)(1) of the Act, 21 U.S.C.section 360bbb-3(b)(1), unless the authorization is terminated  or revoked sooner.       Influenza A by PCR NEGATIVE NEGATIVE Final   Influenza B by PCR NEGATIVE NEGATIVE Final    Comment: (NOTE) The Xpert Xpress SARS-CoV-2/FLU/RSV plus assay is intended as an aid in the diagnosis of influenza from Nasopharyngeal swab specimens and should  not be used as a sole basis for treatment. Nasal washings and aspirates are unacceptable for Xpert Xpress SARS-CoV-2/FLU/RSV testing.  Fact Sheet for Patients: EntrepreneurPulse.com.au  Fact Sheet for Healthcare Providers: IncredibleEmployment.be  This test is not yet approved or cleared by the [place] FDA and has been authorized for detection and/or diagnosis of SARS-CoV-2 by FDA under an Emergency Use Authorization (EUA). This EUA will remain in effect (meaning this test can be used) for the duration of the COVID-19 declaration under  Section 564(b)(1) of the Act, 21 U.S.C. section 360bbb-3(b)(1), unless the authorization is terminated or revoked.  Performed at [facility], [address] [place], [place] [address]   Blood culture (routine x 2)     Status: None   Collection Time: 02/15/21  1:44 AM   Specimen: BLOOD  Result Value Ref Range Status   Specimen Description BLOOD RIGHT ASSIST CONTROL  Final   Special Requests   Final    BOTTLES DRAWN AEROBIC AND ANAEROBIC Blood Culture results may not be optimal due to an excessive volume of blood received in culture bottles   Culture   Final    NO GROWTH 5 DAYS Performed at [facility], [address] [place], [place] [address]    Report Status 02/20/2021 FINAL  Final  Blood culture (routine x 2)     Status: None   Collection Time: 02/15/21  1:44 AM   Specimen: BLOOD  Result Value Ref Range Status   Specimen Description BLOOD RIGHT ASSIST CONTROL  Final   Special Requests   Final    BOTTLES DRAWN AEROBIC AND ANAEROBIC Blood Culture adequate volume   Culture   Final    NO GROWTH 5 DAYS Performed at [facility], [address] [place], [place] [address]    Report Status 02/20/2021 FINAL  Final   Labs: CBC: Recent Labs  Lab 08/03/21 2237 08/04/21 0516  WBC 8.1 7.2  NEUTROABS 3.8  --   HGB 11.0* 9.8*  HCT 33.9* 30.9*  MCV 87.8 87.3  PLT 327 356   Basic Metabolic Panel: Recent Labs  Lab 08/03/21 2237 08/04/21 0516 08/05/21 0756  NA 139 140 138  K 5.3* 4.3 5.0  CL 110 112* 107  CO2 _0 GLUCOSE 233* 95 125*  BUN 66* 56* 34*  CREATININE 2.72* 2.20* 1.55*  CALCIUM 9.1 8.9 8.8*   Liver Function Tests: Recent Labs  Lab 08/03/21 2237  AST 19  ALT 11  ALKPHOS 91  BILITOT 0.5  PROT 7.3  ALBUMIN 3.4*   CBG: Recent Labs  Lab 08/04/21 1120 08/04/21 1704 08/04/21 2046 08/05/21 0753 08/05/21 1146  GLUCAP 158* 82 196* 117* 138*   Discharge time spent: greater than 30 minutes.  Signed: Berle Mull, MD [facility] 08/05/2021

## 2021-10-24 DIAGNOSIS — I2089 Other forms of angina pectoris: Secondary | ICD-10-CM

## 2021-10-24 HISTORY — DX: Other forms of angina pectoris: I20.89

## 2022-01-09 ENCOUNTER — Other Ambulatory Visit: Payer: Self-pay

## 2022-01-09 ENCOUNTER — Emergency Department
Admission: EM | Admit: 2022-01-09 | Discharge: 2022-01-09 | Discharge status: Against Medical Advice | Payer: Medicare Other | Attending: Emergency Medicine | Admitting: Emergency Medicine

## 2022-01-09 ENCOUNTER — Encounter: Payer: Self-pay | Admitting: Emergency Medicine

## 2022-01-09 ENCOUNTER — Emergency Department: Payer: Medicare Other

## 2022-01-09 DIAGNOSIS — R0602 Shortness of breath: Secondary | ICD-10-CM | POA: Insufficient documentation

## 2022-01-09 DIAGNOSIS — Z5321 Procedure and treatment not carried out due to patient leaving prior to being seen by health care provider: Secondary | ICD-10-CM | POA: Insufficient documentation

## 2022-01-09 DIAGNOSIS — R531 Weakness: Secondary | ICD-10-CM | POA: Diagnosis present

## 2022-01-09 DIAGNOSIS — R42 Dizziness and giddiness: Secondary | ICD-10-CM | POA: Diagnosis not present

## 2022-01-09 DIAGNOSIS — R0789 Other chest pain: Secondary | ICD-10-CM | POA: Diagnosis not present

## 2022-01-09 LAB — CBC
HCT: 35 % — ABNORMAL LOW (ref 36.0–46.0)
Hemoglobin: 11.4 g/dL — ABNORMAL LOW (ref 12.0–15.0)
MCH: 27.3 pg (ref 26.0–34.0)
MCHC: 32.6 g/dL (ref 30.0–36.0)
MCV: 83.9 fL (ref 80.0–100.0)
Platelets: 304 10*3/uL (ref 150–400)
RBC: 4.17 MIL/uL (ref 3.87–5.11)
RDW: 13.9 % (ref 11.5–15.5)
WBC: 8.4 10*3/uL (ref 4.0–10.5)
nRBC: 0 % (ref 0.0–0.2)

## 2022-01-09 LAB — BASIC METABOLIC PANEL
Anion gap: 6 (ref 5–15)
BUN: 32 mg/dL — ABNORMAL HIGH (ref 8–23)
CO2: 27 mmol/L (ref 22–32)
Calcium: 9 mg/dL (ref 8.9–10.3)
Chloride: 105 mmol/L (ref 98–111)
Creatinine, Ser: 1.83 mg/dL — ABNORMAL HIGH (ref 0.44–1.00)
GFR, Estimated: 28 mL/min — ABNORMAL LOW (ref >60)
Glucose, Bld: 73 mg/dL (ref 70–99)
Potassium: 3.7 mmol/L (ref 3.5–5.1)
Sodium: 138 mmol/L (ref 135–145)

## 2022-01-09 LAB — TROPONIN I (HIGH SENSITIVITY): Troponin I (High Sensitivity): 8 ng/L (ref <18)

## 2022-01-09 LAB — BRAIN NATRIURETIC PEPTIDE: B Natriuretic Peptide: 19.5 pg/mL (ref 0.0–100.0)

## 2022-01-09 NOTE — ED Notes (Signed)
Pt called for repeat lab work and vital signs. Pt did not answer

## 2022-01-09 NOTE — ED Provider Triage Note (Signed)
  Emergency Medicine Provider Triage Evaluation Note  Theresa Booker , a 76 y.o.female,  was evaluated in triage.  Pt complains of weakness/dizziness since yesterday.  Patient additionally reports that she has been having constant midsternal chest pain for the past month.  Describes chest pain as a dull sensation that chest is left-sided chest and back.  Endorses some mild shortness of breath as well.   Review of Systems  Positive: Weakness/dizziness, chest pain, shortness of breath Negative: Denies fever, abdominal pain, vomiting  Physical Exam   Vitals:   01/09/22 1446  BP: 128/83  Pulse: 80  Resp: 17  Temp: 97.7 F (36.5 C)  SpO2: 96%   Gen:   Awake, no distress   Resp:  Normal effort  MSK:   Moves extremities without difficulty  Other:    Medical Decision Making  Given the patient's initial medical screening exam, the following diagnostic evaluation has been ordered. The patient will be placed in the appropriate treatment space, once one is available, to complete the evaluation and treatment. I have discussed the plan of care with the patient and I have advised the patient that an ED physician or mid-level practitioner will reevaluate their condition after the test results have been received, as the results may give them additional insight into the type of treatment they may need.    Diagnostics: Labs, UA, EKG, CXR  Treatments: none immediately   Teodoro Spray, [place] 01/09/22 1619

## 2022-01-09 NOTE — ED Triage Notes (Signed)
Pt to ER with c/o "sickness", weakness, dizziness that started yesterday and midsternal chest pain that she has had for a month.  Pt states chest pain travels to left side of chest and into back.  Pt states mild SHOB.

## 2022-01-09 NOTE — ED Notes (Signed)
Called for repeat vitals and labs. Pt not in the lobby

## 2022-01-09 NOTE — ED Notes (Signed)
Pt called for repeat vital signs and lab work. Pt did not answer and was not visualized in the lobby.

## 2022-08-21 ENCOUNTER — Encounter: Payer: Self-pay | Admitting: Emergency Medicine

## 2022-08-21 ENCOUNTER — Emergency Department: Payer: 59

## 2022-08-21 ENCOUNTER — Other Ambulatory Visit: Payer: Self-pay

## 2022-08-21 ENCOUNTER — Emergency Department
Admission: EM | Admit: 2022-08-21 | Discharge: 2022-08-21 | Disposition: A | Discharge status: Home / Self Care | Payer: 59 | Attending: Emergency Medicine | Admitting: Emergency Medicine

## 2022-08-21 DIAGNOSIS — E1140 Type 2 diabetes mellitus with diabetic neuropathy, unspecified: Secondary | ICD-10-CM | POA: Insufficient documentation

## 2022-08-21 DIAGNOSIS — M79672 Pain in left foot: Secondary | ICD-10-CM | POA: Diagnosis present

## 2022-08-21 MED ORDER — ACETAMINOPHEN 325 MG PO TABS: 650.0000 mg | ORAL_TABLET | Freq: Four times a day (QID) | ORAL | 1 refills | Status: AC | PRN

## 2022-08-21 NOTE — Discharge Instructions (Signed)
Try to keep foot elevated and you can apply ice to help with swelling.  Take Tylenol 650 mg every 6 hours as needed for pain.  Follow up with orthopedics.

## 2022-08-21 NOTE — ED Triage Notes (Signed)
Pt presents ambulatory to triage via POV with complaints of L foot pain x 3 days. Pt denies falls or injury but has had some mild swelling and tenderness with palpation. No meds taken PTA.

## 2022-08-21 NOTE — ED Provider Notes (Signed)
[facility] Provider Note    Event Date/Time   First MD Initiated Contact with Patient 08/21/22 2007     (approximate)   History   Foot Pain   HPI  Theresa Booker is a 77 y.o. female with PMH of fibromyalgia, chronic joint pain, diabetes and neuropathy who presents for evaluation of foot pain.  Patient states she has had left foot pain for 3 days.  She denies any specific falls or injury but notes she has had some swelling and it is tender to touch.  She has had difficulty walking since the pain began.      Physical Exam   Triage Vital Signs: ED Triage Vitals  Enc Vitals Group     BP 08/21/22 1947 126/83     Pulse Rate 08/21/22 1947 87     Resp 08/21/22 1947 16     Temp 08/21/22 1947 98.3 F (36.8 C)     Temp Source 08/21/22 1947 Oral     SpO2 08/21/22 1947 100 %     Weight 08/21/22 1948 165 lb (74.8 kg)     Height 08/21/22 1948 5' (1.524 m)     Head Circumference --      Peak Flow --      Pain Score 08/21/22 1947 10     Pain Loc --      Pain Edu? --      Excl. in GC? --     Most recent vital signs: Vitals:   08/21/22 1947  BP: 126/83  Pulse: 87  Resp: 16  Temp: 98.3 F (36.8 C)  SpO2: 100%    General: Awake, no distress.  CV:  Good peripheral perfusion.  Resp:  Normal effort.  Abd:  No distention.  Other:  Mild swelling appreciated when compared to the right foot, TTP throughout the left foot and ankle, neurovascularly intact, ROM and strength diminished due to pain.   ED Results / Procedures / Treatments   Labs (all labs ordered are listed, but only abnormal results are displayed) Labs Reviewed - No data to display     RADIOLOGY  Left foot and ankle x-rays obtained in the ED today.  I interpreted the images as well as reviewed the radiologist report.   PROCEDURES:  Critical Care performed: No  Procedures   MEDICATIONS ORDERED IN ED: Medications - No data to display   IMPRESSION / MDM / ASSESSMENT AND PLAN  / ED COURSE  I reviewed the triage vital signs and the nursing notes.                              Differential diagnosis includes, but is not limited to, ankle fracture, foot fracture, arthritis, ankle sprain, neuropathy.  Patient's presentation is most consistent with acute complicated illness / injury requiring diagnostic workup.  Patient presented to the ED for evaluation of left foot pain x 3 days.  On exam patient was tender throughout her foot and ankle so I got x-rays.  I independently interpreted the images as well as reviewed the radiologist report.  Foot and ankle x-rays were negative for fracture or dislocation but did show some soft tissue edema in the ankle x-ray.  I do not believe there is any acute pathology and believe patient is appropriate for outpatient follow-up with orthopedics.  I will provide patient with a walking boot for additional protection of the ankle.  Advised patient to ice and  elevate to decrease her swelling.  Patient can take Tylenol for pain but should avoid ibuprofen due to her chronic kidney disease.  Patient voiced understanding, all questions were answered and patient was stable at discharge.    FINAL CLINICAL IMPRESSION(S) / ED DIAGNOSES   Final diagnoses:  Foot pain, left     Rx / DC Orders   ED Discharge Orders          Ordered    acetaminophen (TYLENOL) 325 MG tablet  Every 6 hours PRN        08/21/22 2144             Note:  This document was prepared using Dragon voice recognition software and may include unintentional dictation errors.   Cameron Ali, PA-C 08/21/22 2156    Georga Hacking, MD 08/21/22 2252

## 2022-11-09 LAB — LAB REPORT - SCANNED
A1c: 7.7
EGFR: 29

## 2022-12-27 ENCOUNTER — Emergency Department: Payer: 59

## 2022-12-27 ENCOUNTER — Observation Stay
Admission: EM | Admit: 2022-12-27 | Discharge: 2022-12-29 | Disposition: A | Discharge status: Home Health | Payer: 59 | Attending: Student | Admitting: Student

## 2022-12-27 DIAGNOSIS — D649 Anemia, unspecified: Secondary | ICD-10-CM

## 2022-12-27 DIAGNOSIS — G8929 Other chronic pain: Secondary | ICD-10-CM | POA: Diagnosis not present

## 2022-12-27 DIAGNOSIS — R55 Syncope and collapse: Secondary | ICD-10-CM

## 2022-12-27 DIAGNOSIS — E871 Hypo-osmolality and hyponatremia: Secondary | ICD-10-CM | POA: Diagnosis not present

## 2022-12-27 DIAGNOSIS — F32A Depression, unspecified: Secondary | ICD-10-CM | POA: Diagnosis present

## 2022-12-27 DIAGNOSIS — R42 Dizziness and giddiness: Secondary | ICD-10-CM

## 2022-12-27 DIAGNOSIS — Z7982 Long term (current) use of aspirin: Secondary | ICD-10-CM | POA: Insufficient documentation

## 2022-12-27 DIAGNOSIS — Z79899 Other long term (current) drug therapy: Secondary | ICD-10-CM | POA: Diagnosis not present

## 2022-12-27 DIAGNOSIS — E1165 Type 2 diabetes mellitus with hyperglycemia: Secondary | ICD-10-CM

## 2022-12-27 DIAGNOSIS — Z7984 Long term (current) use of oral hypoglycemic drugs: Secondary | ICD-10-CM | POA: Insufficient documentation

## 2022-12-27 DIAGNOSIS — I959 Hypotension, unspecified: Secondary | ICD-10-CM | POA: Diagnosis not present

## 2022-12-27 DIAGNOSIS — N179 Acute kidney failure, unspecified: Principal | ICD-10-CM | POA: Diagnosis present

## 2022-12-27 DIAGNOSIS — E1122 Type 2 diabetes mellitus with diabetic chronic kidney disease: Secondary | ICD-10-CM | POA: Insufficient documentation

## 2022-12-27 DIAGNOSIS — W19XXXA Unspecified fall, initial encounter: Secondary | ICD-10-CM | POA: Insufficient documentation

## 2022-12-27 DIAGNOSIS — Z794 Long term (current) use of insulin: Secondary | ICD-10-CM | POA: Insufficient documentation

## 2022-12-27 DIAGNOSIS — R4182 Altered mental status, unspecified: Secondary | ICD-10-CM

## 2022-12-27 DIAGNOSIS — G9341 Metabolic encephalopathy: Secondary | ICD-10-CM | POA: Diagnosis not present

## 2022-12-27 DIAGNOSIS — S301XXA Contusion of abdominal wall, initial encounter: Secondary | ICD-10-CM | POA: Diagnosis not present

## 2022-12-27 DIAGNOSIS — I129 Hypertensive chronic kidney disease with stage 1 through stage 4 chronic kidney disease, or unspecified chronic kidney disease: Secondary | ICD-10-CM | POA: Diagnosis not present

## 2022-12-27 DIAGNOSIS — J449 Chronic obstructive pulmonary disease, unspecified: Secondary | ICD-10-CM | POA: Insufficient documentation

## 2022-12-27 DIAGNOSIS — N1832 Chronic kidney disease, stage 3b: Secondary | ICD-10-CM | POA: Insufficient documentation

## 2022-12-27 DIAGNOSIS — R531 Weakness: Secondary | ICD-10-CM | POA: Diagnosis present

## 2022-12-27 DIAGNOSIS — G3184 Mild cognitive impairment, so stated: Secondary | ICD-10-CM | POA: Diagnosis present

## 2022-12-27 DIAGNOSIS — F1491 Cocaine use, unspecified, in remission: Secondary | ICD-10-CM | POA: Insufficient documentation
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
## 2022-12-27 NOTE — ED Notes (Signed)
Pt left to CT

## 2022-12-28 ENCOUNTER — Inpatient Hospital Stay: Payer: 59

## 2022-12-28 ENCOUNTER — Inpatient Hospital Stay
Admit: 2022-12-28 | Discharge: 2022-12-28 | Disposition: A | Discharge status: Home / Self Care | Payer: 59 | Attending: Internal Medicine | Admitting: Internal Medicine

## 2022-12-28 ENCOUNTER — Emergency Department: Payer: 59

## 2022-12-28 DIAGNOSIS — S301XXA Contusion of abdominal wall, initial encounter: Secondary | ICD-10-CM

## 2022-12-28 DIAGNOSIS — F1491 Cocaine use, unspecified, in remission: Secondary | ICD-10-CM | POA: Insufficient documentation

## 2022-12-28 DIAGNOSIS — G9341 Metabolic encephalopathy: Secondary | ICD-10-CM | POA: Diagnosis not present

## 2022-12-28 DIAGNOSIS — I959 Hypotension, unspecified: Secondary | ICD-10-CM

## 2022-12-28 DIAGNOSIS — J449 Chronic obstructive pulmonary disease, unspecified: Secondary | ICD-10-CM | POA: Insufficient documentation

## 2022-12-28 DIAGNOSIS — D649 Anemia, unspecified: Secondary | ICD-10-CM

## 2022-12-28 LAB — URINE DRUG SCREEN, QUALITATIVE (ARMC ONLY)
Amphetamines, Ur Screen: NOT DETECTED
Barbiturates, Ur Screen: NOT DETECTED
Benzodiazepine, Ur Scrn: NOT DETECTED
Cannabinoid 50 Ng, Ur ~~LOC~~: NOT DETECTED
Cocaine Metabolite,Ur ~~LOC~~: NOT DETECTED
MDMA (Ecstasy)Ur Screen: NOT DETECTED
Methadone Scn, Ur: NOT DETECTED
Opiate, Ur Screen: NOT DETECTED
Phencyclidine (PCP) Ur S: NOT DETECTED
Tricyclic, Ur Screen: POSITIVE — AB

## 2022-12-28 LAB — ETHANOL: Alcohol, Ethyl (B): <10 mg/dL (ref <10)

## 2022-12-28 LAB — CBC
HCT: 26.1 % — ABNORMAL LOW (ref 36.0–46.0)
Hemoglobin: 8.4 g/dL — ABNORMAL LOW (ref 12.0–15.0)
MCH: 28.9 pg (ref 26.0–34.0)
MCHC: 32.2 g/dL (ref 30.0–36.0)
MCV: 89.7 fL (ref 80.0–100.0)
Platelets: 451 10*3/uL — ABNORMAL HIGH (ref 150–400)
RBC: 2.91 MIL/uL — ABNORMAL LOW (ref 3.87–5.11)
RDW: 15.5 % (ref 11.5–15.5)
WBC: 11.7 10*3/uL — ABNORMAL HIGH (ref 4.0–10.5)
nRBC: 0 % (ref 0.0–0.2)

## 2022-12-28 LAB — URINALYSIS, COMPLETE (UACMP) WITH MICROSCOPIC
Bilirubin Urine: NEGATIVE
Glucose, UA: NEGATIVE mg/dL
Hgb urine dipstick: NEGATIVE
Ketones, ur: NEGATIVE mg/dL
Leukocytes,Ua: NEGATIVE
Nitrite: NEGATIVE
Protein, ur: NEGATIVE mg/dL
Specific Gravity, Urine: 1.013 (ref 1.005–1.030)
pH: 5 (ref 5.0–8.0)

## 2022-12-28 LAB — LACTIC ACID, PLASMA
Lactic Acid, Venous: 1.6 mmol/L (ref 0.5–1.9)
Lactic Acid, Venous: 1.9 mmol/L (ref 0.5–1.9)

## 2022-12-28 LAB — COMPREHENSIVE METABOLIC PANEL
ALT: 22 U/L (ref 0–44)
AST: 23 U/L (ref 15–41)
Albumin: 3.8 g/dL (ref 3.5–5.0)
Alkaline Phosphatase: 116 U/L (ref 38–126)
Anion gap: 8 (ref 5–15)
BUN: 67 mg/dL — ABNORMAL HIGH (ref 8–23)
CO2: 24 mmol/L (ref 22–32)
Calcium: 8.8 mg/dL — ABNORMAL LOW (ref 8.9–10.3)
Chloride: 99 mmol/L (ref 98–111)
Creatinine, Ser: 3.6 mg/dL — ABNORMAL HIGH (ref 0.44–1.00)
GFR, Estimated: 12 mL/min — ABNORMAL LOW (ref >60)
Glucose, Bld: 236 mg/dL — ABNORMAL HIGH (ref 70–99)
Potassium: 4.8 mmol/L (ref 3.5–5.1)
Sodium: 131 mmol/L — ABNORMAL LOW (ref 135–145)
Total Bilirubin: 0.9 mg/dL (ref 0.3–1.2)
Total Protein: 8.4 g/dL — ABNORMAL HIGH (ref 6.5–8.1)

## 2022-12-28 LAB — BASIC METABOLIC PANEL
Anion gap: 8 (ref 5–15)
BUN: 63 mg/dL — ABNORMAL HIGH (ref 8–23)
CO2: 22 mmol/L (ref 22–32)
Calcium: 8.4 mg/dL — ABNORMAL LOW (ref 8.9–10.3)
Chloride: 105 mmol/L (ref 98–111)
Creatinine, Ser: 3.08 mg/dL — ABNORMAL HIGH (ref 0.44–1.00)
GFR, Estimated: 15 mL/min — ABNORMAL LOW (ref >60)
Glucose, Bld: 214 mg/dL — ABNORMAL HIGH (ref 70–99)
Potassium: 4.7 mmol/L (ref 3.5–5.1)
Sodium: 135 mmol/L (ref 135–145)

## 2022-12-28 LAB — ACETAMINOPHEN LEVEL: Acetaminophen (Tylenol), Serum: <10 ug/mL — ABNORMAL LOW (ref 10–30)

## 2022-12-28 LAB — TROPONIN I (HIGH SENSITIVITY)
Troponin I (High Sensitivity): 7 ng/L (ref <18)
Troponin I (High Sensitivity): 8 ng/L (ref <18)

## 2022-12-28 LAB — CBC WITH DIFFERENTIAL/PLATELET
Abs Immature Granulocytes: 0.22 10*3/uL — ABNORMAL HIGH (ref 0.00–0.07)
Basophils Absolute: 0.1 10*3/uL (ref 0.0–0.1)
Basophils Relative: 1 %
Eosinophils Absolute: 0.1 10*3/uL (ref 0.0–0.5)
Eosinophils Relative: 1 %
HCT: 29.2 % — ABNORMAL LOW (ref 36.0–46.0)
Hemoglobin: 9.5 g/dL — ABNORMAL LOW (ref 12.0–15.0)
Immature Granulocytes: 2 %
Lymphocytes Relative: 14 %
Lymphs Abs: 1.5 10*3/uL (ref 0.7–4.0)
MCH: 28.6 pg (ref 26.0–34.0)
MCHC: 32.5 g/dL (ref 30.0–36.0)
MCV: 88 fL (ref 80.0–100.0)
Monocytes Absolute: 1 10*3/uL (ref 0.1–1.0)
Monocytes Relative: 9 %
Neutro Abs: 8.1 10*3/uL — ABNORMAL HIGH (ref 1.7–7.7)
Neutrophils Relative %: 73 %
Platelets: 525 10*3/uL — ABNORMAL HIGH (ref 150–400)
RBC: 3.32 MIL/uL — ABNORMAL LOW (ref 3.87–5.11)
RDW: 15.4 % (ref 11.5–15.5)
WBC: 11 10*3/uL — ABNORMAL HIGH (ref 4.0–10.5)
nRBC: 0 % (ref 0.0–0.2)

## 2022-12-28 LAB — VITAMIN B12: Vitamin B-12: 445 pg/mL (ref 180–914)

## 2022-12-28 LAB — RETICULOCYTES
Immature Retic Fract: 23.8 % — ABNORMAL HIGH (ref 2.3–15.9)
RBC.: 3.06 MIL/uL — ABNORMAL LOW (ref 3.87–5.11)
Retic Count, Absolute: 105.6 10*3/uL (ref 19.0–186.0)
Retic Ct Pct: 3.5 % — ABNORMAL HIGH (ref 0.4–3.1)

## 2022-12-28 LAB — LIPASE, BLOOD: Lipase: 31 U/L (ref 11–51)

## 2022-12-28 LAB — GLUCOSE, CAPILLARY
Glucose-Capillary: 161 mg/dL — ABNORMAL HIGH (ref 70–99)
Glucose-Capillary: 125 mg/dL — ABNORMAL HIGH (ref 70–99)
Glucose-Capillary: 68 mg/dL — ABNORMAL LOW (ref 70–99)
Glucose-Capillary: 169 mg/dL — ABNORMAL HIGH (ref 70–99)
Glucose-Capillary: 219 mg/dL — ABNORMAL HIGH (ref 70–99)

## 2022-12-28 LAB — FERRITIN: Ferritin: 209 ng/mL (ref 11–307)

## 2022-12-28 LAB — IRON AND TIBC
Iron: 33 ug/dL (ref 28–170)
Saturation Ratios: 13 % (ref 10.4–31.8)
TIBC: 259 ug/dL (ref 250–450)
UIBC: 226 ug/dL

## 2022-12-28 LAB — SALICYLATE LEVEL: Salicylate Lvl: <7 mg/dL — ABNORMAL LOW (ref 7.0–30.0)

## 2022-12-28 LAB — D-DIMER, QUANTITATIVE: D-Dimer, Quant: 3.96 ug{FEU}/mL — ABNORMAL HIGH (ref 0.00–0.50)

## 2022-12-28 LAB — CBG MONITORING, ED
Glucose-Capillary: 178 mg/dL — ABNORMAL HIGH (ref 70–99)
Glucose-Capillary: 210 mg/dL — ABNORMAL HIGH (ref 70–99)

## 2022-12-28 LAB — MAGNESIUM: Magnesium: 2.1 mg/dL (ref 1.7–2.4)

## 2022-12-28 LAB — HEMOGLOBIN A1C
Hgb A1c MFr Bld: 7.5 % — ABNORMAL HIGH (ref 4.8–5.6)
Mean Plasma Glucose: 168.55 mg/dL

## 2022-12-28 LAB — FOLATE: Folate: 24 ng/mL (ref >5.9)

## 2022-12-28 LAB — PHOSPHORUS: Phosphorus: 4.4 mg/dL (ref 2.5–4.6)

## 2022-12-28 LAB — TSH: TSH: 1.431 u[IU]/mL (ref 0.350–4.500)

## 2022-12-28 MED ORDER — ACETAMINOPHEN 650 MG RE SUPP: 650.0000 mg | Freq: Four times a day (QID) | RECTAL | Status: DC | PRN

## 2022-12-28 MED ORDER — HEPARIN SODIUM (PORCINE) 5000 UNIT/ML IJ SOLN
5000.0000 [IU] | Freq: Three times a day (TID) | INTRAMUSCULAR | Status: DC
  Administered 2022-12-28: 5000 [IU] via SUBCUTANEOUS
  Filled 2022-12-28: qty 1

## 2022-12-28 MED ORDER — ENOXAPARIN SODIUM 30 MG/0.3ML IJ SOSY
30.0000 mg | PREFILLED_SYRINGE | INTRAMUSCULAR | Status: DC
Start: 2022-12-28 — End: 2022-12-28

## 2022-12-28 MED ORDER — INSULIN ASPART 100 UNIT/ML IJ SOLN
0.0000 [IU] | Freq: Every day | INTRAMUSCULAR | Status: DC
  Administered 2022-12-28: 2 [IU] via SUBCUTANEOUS
  Filled 2022-12-28: qty 1

## 2022-12-28 MED ORDER — LACTATED RINGERS IV BOLUS
1000.0000 mL | Freq: Once | INTRAVENOUS | Status: AC
  Administered 2022-12-28: 1000 mL via INTRAVENOUS

## 2022-12-28 MED ORDER — INSULIN GLARGINE-YFGN 100 UNIT/ML ~~LOC~~ SOLN
10.0000 [IU] | Freq: Every day | SUBCUTANEOUS | Status: DC
  Administered 2022-12-29: 10 [IU] via SUBCUTANEOUS
  Filled 2022-12-28 (×3): qty 0.1

## 2022-12-28 MED ORDER — SODIUM CHLORIDE 0.9% FLUSH
3.0000 mL | Freq: Two times a day (BID) | INTRAVENOUS | Status: DC
  Administered 2022-12-28 – 2022-12-29 (×3): 3 mL via INTRAVENOUS

## 2022-12-28 MED ORDER — ONDANSETRON HCL 4 MG/2ML IJ SOLN: 4.0000 mg | Freq: Four times a day (QID) | INTRAMUSCULAR | Status: DC | PRN

## 2022-12-28 MED ORDER — INSULIN ASPART 100 UNIT/ML IJ SOLN
0.0000 [IU] | INTRAMUSCULAR | Status: DC
  Administered 2022-12-28: 3 [IU] via SUBCUTANEOUS
  Administered 2022-12-28: 2 [IU] via SUBCUTANEOUS
  Filled 2022-12-28 (×2): qty 1

## 2022-12-28 MED ORDER — INSULIN ASPART 100 UNIT/ML IJ SOLN
0.0000 [IU] | Freq: Three times a day (TID) | INTRAMUSCULAR | Status: DC
  Administered 2022-12-28 – 2022-12-29 (×2): 1 [IU] via SUBCUTANEOUS
  Filled 2022-12-28 (×2): qty 1

## 2022-12-28 MED ORDER — IPRATROPIUM-ALBUTEROL 0.5-2.5 (3) MG/3ML IN SOLN: 3.0000 mL | RESPIRATORY_TRACT | Status: DC | PRN

## 2022-12-28 MED ORDER — ONDANSETRON HCL 4 MG PO TABS: 4.0000 mg | ORAL_TABLET | Freq: Four times a day (QID) | ORAL | Status: DC | PRN

## 2022-12-28 MED ORDER — ACETAMINOPHEN 325 MG PO TABS
650.0000 mg | ORAL_TABLET | Freq: Four times a day (QID) | ORAL | Status: DC | PRN
  Administered 2022-12-28: 650 mg via ORAL
  Filled 2022-12-28: qty 2

## 2022-12-28 MED ORDER — SODIUM CHLORIDE 0.9 % IV BOLUS
1000.0000 mL | Freq: Once | INTRAVENOUS | Status: AC
  Administered 2022-12-28: 1000 mL via INTRAVENOUS

## 2022-12-28 NOTE — ED Notes (Signed)
Pt returned from CT °

## 2022-12-28 NOTE — Inpatient Diabetes Management (Signed)
Inpatient Diabetes Program Recommendations  AACE/ADA: New Consensus Statement on Inpatient Glycemic Control (2015)  Target Ranges:  Prepandial:   less than 140 mg/dL      Peak postprandial:   less than 180 mg/dL (1-2 hours)      Critically ill patients:  140 - 180 mg/dL   Lab Results  Component Value Date   GLUCAP 68 (L) 12/28/2022   HGBA1C 7.5 (H) 12/28/2022    Review of Glycemic Control  Latest Reference Range & Units 12/28/22 07:25 12/28/22 12:06  Glucose-Capillary 70 - 99 mg/dL 161 (H) 68 (L)  (H): Data is abnormally high (L): Data is abnormally low Diabetes history: Type 2 DM Outpatient Diabetes medications: Januvia 100 mg every day, Lantus 20 units QD Current orders for Inpatient glycemic control: Semglee 10 units every day, Novolog 0-15 units Q4H  Inpatient Diabetes Program Recommendations:    Noted hypoglycemia following Novolog 2 units. Of note, patient received dose >3 hours after previous CBG. With hypoglycemia and current renal status consider reducing correction to Novolog 0-6 units q4h.   Thanks, Lujean Rave, MSN, RNC-OB Diabetes Coordinator [phone] (8a-5p)

## 2022-12-28 NOTE — Progress Notes (Addendum)
PT Cancellation Note  Patient Details Name: Theresa Booker MRN: [id number] DOB: [birth year]   Cancelled Treatment:    Reason Eval/Treat Not Completed: Patient at procedure or test/unavailable. Orders received, chart reviewed. Upon entry to room pt receiving echocardiogram. PT to hold and re-attempt at a later time/ date.   Lovie Macadamia, SPT  Delphia Grates. Fairly IV, PT, DPT Physical Therapist- [place]  [facility]  12/28/2022, 3:05 PM

## 2022-12-28 NOTE — Progress Notes (Signed)
Transition of Care [facility]) - Inpatient Brief Assessment   Patient Details  Name: Theresa Booker MRN: [id number] Date of Birth: [birth year]  Transition of Care [facility]) CM/SW Contact:    Truddie Hidden, RN Phone Number: 12/28/2022, 10:20 AM   Clinical Narrative: Per Merry Proud at [facility] patient is active for PT.    Transition of Care Asessment:

## 2022-12-28 NOTE — Progress Notes (Signed)
*  PRELIMINARY RESULTS* Echocardiogram 2D Echocardiogram has been performed.  Theresa Booker 12/28/2022, 3:34 PM

## 2022-12-28 NOTE — TOC Initial Note (Signed)
Transition of Care [facility]) - Initial/Assessment Note    Patient Details  Name: Theresa Booker MRN: [id number] Date of Birth: 1945-03-08  Transition of Care [facility]) CM/SW Contact:    Truddie Hidden, RN Phone Number: 12/28/2022, 1:39 PM  Clinical Narrative:       RA completed             Admitted WUJ:WJXBJ metabolic encephalopathy Presyncope with postural dizziness Admitted from: Home with adult  grandsons PCP: [facility]  Pharmacy: [facility]  Current home health/prior home health/DME: [facility], walker HH: Active for PT with [facility] Transportation: Grandsons will transport home. Medical transportation for appointments   Expected Discharge Plan: Home w Home Health Services Barriers to Discharge: Continued Medical Work up   Patient Goals and CMS Choice Patient states their goals for this hospitalization and ongoing recovery are:: Home          Expected Discharge Plan and Services       Living arrangements for the past 2 months: Single Family Home                           HH Arranged: PT HH Agency: [facility] Date HH Agency Contacted: 12/28/22 Time HH Agency Contacted: 1338 Representative spoke with at [facility] Agency: Brandi  Prior Living Arrangements/Services Living arrangements for the past 2 months: Single Family Home Lives with:: Other (Comment) (Adult grandsons)   Do you feel safe going back to the place where you live?: Yes      Need for Family Participation in Patient Care: Yes (Comment) Care giver support system in place?: Yes (comment) Current home services: Home PT Criminal Activity/Legal Involvement Pertinent to Current Situation/Hospitalization: No - Comment as needed  Activities of Daily Living      Permission Sought/Granted                  Emotional Assessment Appearance:: Appears stated age Attitude/Demeanor/Rapport: Gracious, Engaged Affect (typically observed): Accepting Orientation: : Oriented to Self, Oriented to  Place, Oriented to  Time, Oriented to Situation Alcohol / Substance Use: Not Applicable Psych Involvement: No (comment)  Admission diagnosis:  AKI (acute kidney injury) (HCC) [N17.9] Altered mental status, unspecified altered mental status type [R41.82] Acute metabolic encephalopathy [G93.41] Patient Active Problem List   Diagnosis Date Noted   Acute metabolic encephalopathy 12/28/2022   Hypotension 12/28/2022   Anemia 12/28/2022   History of cocaine use 12/28/2022   Hematoma of abdominal wall, initial encounter 12/28/2022   Chronic obstructive pulmonary disease (COPD) (HCC) 12/28/2022   Metabolic encephalopathy 08/04/2021   Type 2 diabetes mellitus with chronic kidney disease, with long-term current use of insulin (HCC) 08/04/2021   Depression 08/04/2021   Left lower lobe pneumonia 02/15/2021   Acute renal failure superimposed on stage 3b chronic kidney disease (HCC) 02/15/2021   Uncontrolled type 2 diabetes mellitus with hyperglycemia, with long-term current use of insulin (HCC) 02/15/2021   Diabetes (HCC) 06/09/2020   SDH (subdural hematoma) (HCC) 06/05/2019   Sepsis (HCC) 08/07/2018   AMS (altered mental status) 07/30/2018   Altered mental status 07/29/2018   Rotator cuff tendinitis, right 04/01/2017   Traumatic complete tear of right rotator cuff 04/01/2017   Dysphagia, unspecified 09/28/2015   Cocaine use 05/03/2015   Abnormal MRI, lumbar spine 05/03/2015   Numbness in feet 04/29/2015   Gait instability 04/29/2015   BP (high blood pressure) 04/13/2015   Fibromyalgia 04/13/2015   Essential (primary) hypertension 04/13/2015   Clinical  depression 04/13/2015   Chronic kidney disease (CKD), stage III (moderate) (HCC) 04/13/2015   Airway hyperreactivity 04/13/2015   Chronic knee pain (Right) 04/13/2015   Chronic low back pain (Location of Secondary source of pain) (Bilateral) (R>L) 04/13/2015   Chronic lower straight pain (Location of Primary Source of Pain) (Right)  04/13/2015   Chronic lumbar radicular pain (Right) (L5 Dermatome) 04/13/2015   At high risk for falls 04/13/2015   Chronic hip pain (Right) 04/13/2015   Chronic sacroiliac joint pain (Bilateral) (R>L) 04/13/2015   Controlled type 2 diabetes mellitus without complication (HCC) 02/01/2015   Headache disorder 02/01/2015   Postural dizziness with presyncope 02/01/2015   Bad posture 02/01/2015   Episodic tension type headache 01/10/2015   Amnesia 10/18/2014   Mild cognitive impairment 06/21/2014   Combined fat and carbohydrate induced hyperlipemia 09/24/2013   Acid reflux 09/24/2013   Chronic pain 09/24/2013   Avitaminosis D 02/11/2013   PCP:  Abram Sander, MD Pharmacy:   [facility] - [place] H[place], [place] - [address] AT [address] [place] [address] Phone: [phone] Fax: [phone]  [facility] [address] ([address]), [place] - [address] [place] (N) [place] [address] Phone: [phone] Fax: [phone]     Social Determinants of Health (SDOH) Social History: SDOH Screenings   Food Insecurity: Food Insecurity Present (06/05/2019)   [facility] from [facility], [facility]  Transportation Needs: Unknown (07/29/2018)  Financial Resource Strain: Medium Risk (07/29/2018)  Physical Activity: Unknown (07/29/2018)  Social Connections: Unknown (07/29/2018)  Stress: No Stress Concern Present (07/29/2018)  Tobacco Use: Low Risk  (12/27/2022)   SDOH Interventions:     Readmission Risk Interventions    12/28/2022    1:10 PM  Readmission Risk Prevention Plan  Transportation Screening Complete  PCP or Specialist Appt within 3-5 Days Complete  HRI or Home Care Consult Complete  Social Work Consult for Recovery Care Planning/Counseling Complete  Palliative Care Screening Not Applicable  Medication Review Oceanographer) Complete

## 2022-12-28 NOTE — Assessment & Plan Note (Addendum)
Suspect related to medication Low suspicion for sepsis as not meeting criteria Troponin and TSH normal. D-dimer >3 but likely related to renal function.  Low suspicion for PE as patient not tachycardic tachypneic or hypoxic->not pursuing further workup for now Holding isosorbide and furosemide IV hydration Fall precautions

## 2022-12-28 NOTE — Evaluation (Signed)
Occupational Therapy Evaluation Patient Details Name: Theresa Booker MRN: [id number] DOB: Sep 17, 1945 Today's Date: 12/28/2022   History of Present Illness Theresa Booker is a 77 y.o. female with medical history significant for DM, HTN, COPD, CKD 3b, History of DOE/CP with reassuring cardiac workup 01/2022( lexiscan and echo), chronic pain related to lumbar radiculopathy and fibromyalgia, depression,  presenting via EMS with weakness and lethargy resulting in two assisted falls.   Clinical Impression   Theresa Booker was seen for OT evaluation this date. Prior to hospital admission, pt was MOD I using SPC. Pt lives alone with family available. Pt currently requires SBA + RW for toilet t/f and standing grooming tasks. SETUP don underwear in sitting. MIN A don B socks. Pt would benefit from skilled OT to address noted impairments and functional limitations (see below for any additional details). Upon hospital discharge, recommend OT follow up.    If plan is discharge home, recommend the following: Help with stairs or ramp for entrance;A little help with bathing/dressing/bathroom    Functional Status Assessment  Patient has had a recent decline in their functional status and demonstrates the ability to make significant improvements in function in a reasonable and predictable amount of time.  Equipment Recommendations  BSC/3in1    Recommendations for Other Services       Precautions / Restrictions Precautions Precautions: Fall Restrictions Weight Bearing Restrictions: No      Mobility Bed Mobility Overal bed mobility: Needs Assistance Bed Mobility: Supine to Sit     Supine to sit: Supervision          Transfers Overall transfer level: Needs assistance Equipment used: Rolling walker (2 wheels) Transfers: Sit to/from Stand Sit to Stand: Supervision                  Balance Overall balance assessment: Needs assistance Sitting-balance support: No upper extremity  supported, Feet supported Sitting balance-Leahy Scale: Good     Standing balance support: No upper extremity supported, During functional activity Standing balance-Leahy Scale: Fair                             ADL either performed or assessed with clinical judgement   ADL Overall ADL's : Needs assistance/impaired                                       General ADL Comments: SBA + RW for toilet t/f and standing grooming tasks. SETUP don underwear in sitting. MIN A don B socks      Pertinent Vitals/Pain Pain Assessment Pain Assessment: Faces Faces Pain Scale: Hurts even more Pain Location: R ankle Pain Descriptors / Indicators: Aching Pain Intervention(s): Limited activity within patient's tolerance, Repositioned     Extremity/Trunk Assessment Upper Extremity Assessment Upper Extremity Assessment: Overall WFL for tasks assessed   Lower Extremity Assessment Lower Extremity Assessment: Generalized weakness       Communication Communication Communication: No apparent difficulties   Cognition Arousal: Alert Behavior During Therapy: WFL for tasks assessed/performed Overall Cognitive Status: Within Functional Limits for tasks assessed                                                  Home Living Family/patient  expects to be discharged to:: Private residence Living Arrangements: Alone Available Help at Discharge: Family;Available PRN/intermittently Type of Home: House Home Access: Stairs to enter [employer] of Steps: 1   Home Layout: One level               Home Equipment: Cane - single point          Prior Functioning/Environment Prior Level of Function : Independent/Modified Independent                        OT Problem List: Decreased activity tolerance;Impaired balance (sitting and/or standing);Pain      OT Treatment/Interventions: Self-care/ADL training;Therapeutic exercise;Energy  conservation;DME and/or AE instruction;Therapeutic activities;Patient/family education;Balance training    OT Goals(Current goals can be found in the care plan section) Acute Rehab OT Goals Patient Stated Goal: to go home OT Goal Formulation: With patient Time For Goal Achievement: 01/11/23 Potential to Achieve Goals: Good ADL Goals Pt Will Perform Grooming: with modified independence;standing Pt Will Perform Lower Body Dressing: with modified independence;sit to/from stand Pt Will Transfer to Toilet: with modified independence;ambulating;regular height toilet  OT Frequency: Min 1X/week    Co-evaluation              AM-PAC OT "6 Clicks" Daily Activity     Outcome Measure Help from another person eating meals?: None Help from another person taking care of personal grooming?: A Little Help from another person toileting, which includes using toliet, bedpan, or urinal?: A Little Help from another person bathing (including washing, rinsing, drying)?: A Lot Help from another person to put on and taking off regular upper body clothing?: A Little Help from another person to put on and taking off regular lower body clothing?: A Lot 6 Click Score: 17   End of Session Equipment Utilized During Treatment: Rolling walker (2 wheels) Nurse Communication: Mobility status  Activity Tolerance: Patient tolerated treatment well Patient left: in chair;with call bell/phone within reach;with family/visitor present  OT Visit Diagnosis: Unsteadiness on feet (R26.81)                Time: 4010-2725 OT Time Calculation (min): 26 min Charges:  OT General Charges $OT Visit: 1 Visit OT Evaluation $OT Eval Moderate Complexity: 1 Mod OT Treatments $Self Care/Home Management : 8-22 mins  Kathie Dike, M.S. OTR/L  12/28/22, 12:55 PM  ascom [phone]

## 2022-12-28 NOTE — Assessment & Plan Note (Addendum)
Acuity uncertain, suspecting chronic.  Hemoglobin 9.5 with no recent baseline-was 11.5 over a year ago Anemia panel and serial H&H  ---9.5-->8.4, suspecting hemodilutional

## 2022-12-28 NOTE — Assessment & Plan Note (Signed)
Blood glucose 236 Continue Lantus Sliding scale coverage

## 2022-12-28 NOTE — Assessment & Plan Note (Signed)
Delirium precautions 

## 2022-12-28 NOTE — ED Notes (Signed)
Bladder scan : 0ml   Zorianna Taliaferro RN/Kathy RN
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
## 2022-12-28 NOTE — ED Notes (Signed)
Pt left for CT.

## 2022-12-28 NOTE — Assessment & Plan Note (Signed)
Continue venlafaxine 

## 2022-12-28 NOTE — Progress Notes (Signed)
Patient admitted to room 40B from the ED. Patient is asleep but easy to arouse. Oriented x4. V/S obtained and tele monitor applied. Patient oriented to room, may need reinforcement. Bed low, locked, and bed alarm on. Call bell within reach.

## 2022-12-28 NOTE — Assessment & Plan Note (Addendum)
Judicious pain meds in view of lethargy and somnolence Holding nortriptyline 50 nightly and gabapentin 300 3 times daily

## 2022-12-28 NOTE — Assessment & Plan Note (Signed)
Follow up UDS.
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
## 2022-12-28 NOTE — Assessment & Plan Note (Signed)
Not acutely exacerbated Continue home inhalers.  DuoNebs as needed 

## 2022-12-28 NOTE — Assessment & Plan Note (Signed)
Creatinine 3.6 up from baseline of 1.55 Suspect related to dehydration and hypotension from medication-as no prior reports of inadequate oral intake Hold Lasix, Imdur Continue IV hydration Avoid nephrotoxins and monitor renal function

## 2022-12-28 NOTE — Progress Notes (Signed)
Anticoagulation monitoring(Lovenox):  77 yo female ordered Lovenox 30 mg Q24h    Filed Weights   12/27/22 2327  Weight: 77.1 kg (170 lb)   BMI 28.3   Lab Results  Component Value Date   CREATININE 3.60 (H) 12/27/2022   CREATININE 1.83 (H) 01/09/2022   CREATININE 1.55 (H) 08/05/2021   Estimated Creatinine Clearance: 13 mL/min (A) (by C-G formula based on SCr of 3.6 mg/dL (H)). Hemoglobin & Hematocrit     Component Value Date/Time   HGB 9.5 (L) 12/27/2022 2335   HGB 10.5 (L) 07/10/2013 0510   HCT 29.2 (L) 12/27/2022 2335   HCT 32.1 (L) 07/10/2013 0510     Per Protocol for Patient with estCrcl < 15 ml/min and BMI < 30, will transition to Heparin 5000 units SQ Q8H.

## 2022-12-28 NOTE — Assessment & Plan Note (Addendum)
Presyncope with postural dizziness Suspect multifactorial etiology including dehydration with hypotension, sedating medication,  Will get UDS given prior documentation of history of cocaine use.  Salicylate ethanol and acetaminophen levels were undetectable Urinalysis to evaluate for possible UTI Follow-up official CT head-->called [facility] radiology  to follow up however delay due to test not being completed Syncope workup to include continuous cardiac monitoring, echocardiogram and carotid Doppler N.p.o. until more awake and alert Hold sedating meds-gabapentin, amitriptyline Fall and aspiration precautions

## 2022-12-29 DIAGNOSIS — G9341 Metabolic encephalopathy: Secondary | ICD-10-CM | POA: Diagnosis not present

## 2022-12-29 LAB — GLUCOSE, CAPILLARY
Glucose-Capillary: 201 mg/dL — ABNORMAL HIGH (ref 70–99)
Glucose-Capillary: 158 mg/dL — ABNORMAL HIGH (ref 70–99)
Glucose-Capillary: 134 mg/dL — ABNORMAL HIGH (ref 70–99)

## 2022-12-29 LAB — ECHOCARDIOGRAM COMPLETE
AR max vel: 2.06 cm2
AV Area VTI: 2.17 cm2
AV Area mean vel: 2.21 cm2
AV Mean grad: 4 mm[Hg]
AV Peak grad: 8.3 mm[Hg]
Ao pk vel: 1.44 m/s
Area-P 1/2: 3.81 cm2
Height: 65 in
MV VTI: 2.58 cm2
S' Lateral: 3.1 cm
Weight: 2720 [oz_av]

## 2022-12-29 LAB — CBC
HCT: 28.3 % — ABNORMAL LOW (ref 36.0–46.0)
Hemoglobin: 9 g/dL — ABNORMAL LOW (ref 12.0–15.0)
MCH: 28.1 pg (ref 26.0–34.0)
MCHC: 31.8 g/dL (ref 30.0–36.0)
MCV: 88.4 fL (ref 80.0–100.0)
Platelets: 495 10*3/uL — ABNORMAL HIGH (ref 150–400)
RBC: 3.2 MIL/uL — ABNORMAL LOW (ref 3.87–5.11)
RDW: 15.2 % (ref 11.5–15.5)
WBC: 7 10*3/uL (ref 4.0–10.5)
nRBC: 0 % (ref 0.0–0.2)

## 2022-12-29 LAB — BASIC METABOLIC PANEL
Anion gap: 8 (ref 5–15)
BUN: 54 mg/dL — ABNORMAL HIGH (ref 8–23)
CO2: 24 mmol/L (ref 22–32)
Calcium: 8.6 mg/dL — ABNORMAL LOW (ref 8.9–10.3)
Chloride: 103 mmol/L (ref 98–111)
Creatinine, Ser: 2.5 mg/dL — ABNORMAL HIGH (ref 0.44–1.00)
GFR, Estimated: 19 mL/min — ABNORMAL LOW (ref >60)
Glucose, Bld: 216 mg/dL — ABNORMAL HIGH (ref 70–99)
Potassium: 4.5 mmol/L (ref 3.5–5.1)
Sodium: 135 mmol/L (ref 135–145)

## 2022-12-29 LAB — MAGNESIUM: Magnesium: 2 mg/dL (ref 1.7–2.4)

## 2022-12-29 LAB — PHOSPHORUS: Phosphorus: 3.4 mg/dL (ref 2.5–4.6)
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
## 2022-12-29 NOTE — Care Management CC44 (Signed)
Condition Code 44 Documentation Completed  Patient Details  Name: Theresa Booker MRN: [id number] Date of Birth: 24-Feb-1946   Condition Code 44 given:  Yes Patient signature on Condition Code 44 notice:  Yes Documentation of 2 MD's agreement:  Yes Code 44 added to claim:  Yes    Hetty Ely, RN 12/29/2022, 10:41 AM
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
## 2022-12-31 ENCOUNTER — Other Ambulatory Visit: Payer: Self-pay
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
## 2023-01-01 ENCOUNTER — Encounter: Payer: Self-pay | Admitting: Physician Assistant

## 2023-01-01 ENCOUNTER — Ambulatory Visit (INDEPENDENT_AMBULATORY_CARE_PROVIDER_SITE_OTHER): Payer: 59 | Admitting: Physician Assistant

## 2023-01-01 VITALS — BP 110/69 | HR 86 | Temp 97.7°F | Ht 61.0 in | Wt 168.0 lb

## 2023-01-01 DIAGNOSIS — R1310 Dysphagia, unspecified: Secondary | ICD-10-CM

## 2023-01-01 DIAGNOSIS — K219 Gastro-esophageal reflux disease without esophagitis: Secondary | ICD-10-CM

## 2023-01-01 DIAGNOSIS — D649 Anemia, unspecified: Secondary | ICD-10-CM | POA: Diagnosis not present

## 2023-01-01 DIAGNOSIS — K5900 Constipation, unspecified: Secondary | ICD-10-CM

## 2023-01-01 MED ORDER — OMEPRAZOLE 40 MG PO CPDR: 40.0000 mg | DELAYED_RELEASE_CAPSULE | Freq: Every day | ORAL | 2 refills | Status: AC

## 2023-01-01 NOTE — Patient Instructions (Addendum)
Barium Swallow scheduled @ [facility]  on 01/07/23 @ 10:00 am. Nothing to eat after 7 am.

## 2023-01-02 ENCOUNTER — Telehealth: Payer: Self-pay

## 2023-01-02 ENCOUNTER — Other Ambulatory Visit: Payer: Self-pay

## 2023-01-02 DIAGNOSIS — R1310 Dysphagia, unspecified: Secondary | ICD-10-CM

## 2023-01-02 DIAGNOSIS — D649 Anemia, unspecified: Secondary | ICD-10-CM

## 2023-01-02 LAB — CBC WITH DIFFERENTIAL/PLATELET
Basophils Absolute: 0.1 10*3/uL (ref 0.0–0.2)
Basos: 1 %
EOS (ABSOLUTE): 0.1 10*3/uL (ref 0.0–0.4)
Eos: 1 %
Hematocrit: 30.3 % — ABNORMAL LOW (ref 34.0–46.6)
Hemoglobin: 9.5 g/dL — ABNORMAL LOW (ref 11.1–15.9)
Immature Grans (Abs): 0.1 10*3/uL (ref 0.0–0.1)
Immature Granulocytes: 1 %
Lymphocytes Absolute: 1.9 10*3/uL (ref 0.7–3.1)
Lymphs: 20 %
MCH: 28.3 pg (ref 26.6–33.0)
MCHC: 31.4 g/dL — ABNORMAL LOW (ref 31.5–35.7)
MCV: 90 fL (ref 79–97)
Monocytes Absolute: 0.8 10*3/uL (ref 0.1–0.9)
Monocytes: 9 %
Neutrophils Absolute: 6.6 10*3/uL (ref 1.4–7.0)
Neutrophils: 68 %
Platelets: 467 10*3/uL — ABNORMAL HIGH (ref 150–450)
RBC: 3.36 x10E6/uL — ABNORMAL LOW (ref 3.77–5.28)
RDW: 14.9 % (ref 11.7–15.4)
WBC: 9.8 10*3/uL (ref 3.4–10.8)

## 2023-01-02 LAB — IRON,TIBC AND FERRITIN PANEL
Ferritin: 374 ng/mL — ABNORMAL HIGH (ref 15–150)
Iron Saturation: 13 % — ABNORMAL LOW (ref 15–55)
Iron: 32 ug/dL (ref 27–139)
Total Iron Binding Capacity: 246 ug/dL — ABNORMAL LOW (ref 250–450)
UIBC: 214 ug/dL (ref 118–369)

## 2023-01-02 LAB — B12 AND FOLATE PANEL
Folate: 9.1 ng/mL (ref >3.0)
Vitamin B-12: 507 pg/mL (ref 232–1245)

## 2023-01-02 MED ORDER — PEG 3350-KCL-NA BICARB-NACL 420 G PO SOLR: 4000.0000 mL | Freq: Once | ORAL | 0 refills | Status: AC

## 2023-01-02 NOTE — Telephone Encounter (Signed)
Spoke with patient- [facility] and take half dose insulin the night before-golytely.  lease call and notify patient her hemoglobin is slowly improving.  Hemoglobin is up to 9.5.  Vitamin B12 and folate are normal.  Iron is a little bit low.  Continue OTC ferrous sulfate iron 325 Mg once daily.  Continue with plan for barium swallow with tablet.  **Go ahead and schedule EGD and colonoscopy, diagnosis iron deficiency anemia.  GoLytely 2-day prep.  Any MD.

## 2023-01-02 NOTE — Progress Notes (Signed)
Please call and notify patient her hemoglobin is slowly improving.  Hemoglobin is up to 9.5.  Vitamin B12 and folate are normal.  Iron is a little bit low.  Continue OTC ferrous sulfate iron 325 Mg once daily.  Continue with plan for barium swallow with tablet.  **Go ahead and schedule EGD and colonoscopy, diagnosis iron deficiency anemia.  GoLytely 2-day prep.  Any MD.

## 2023-01-02 NOTE — Telephone Encounter (Signed)
error 

## 2023-01-07 ENCOUNTER — Inpatient Hospital Stay: Admission: RE | Admit: 2023-01-07 | Payer: 59 | Source: Ambulatory Visit

## 2023-02-05 ENCOUNTER — Other Ambulatory Visit: Payer: Self-pay

## 2023-02-05 ENCOUNTER — Ambulatory Visit: Payer: 59 | Admitting: Physician Assistant

## 2023-02-05 NOTE — Progress Notes (Unsigned)
Celso Amy, PA-C [address]  [address]  [place], [place] [address]  Main: [phone]  Fax: [phone]   Primary Care Physician: Abram Sander, MD  Primary Gastroenterologist:  ***  CC: F/U dysphagia, GERD, iron deficiency anemia, and constipation.  HPI: Theresa Booker is a 77 y.o. female returns for 1 month follow-up of dysphagia, GERD, iron deficiency anemia, and constipation.  1 month ago omeprazole was increased from 20 to 40 mg daily.  Also started on MiraLAX for constipation.  Labs showed improved stable hemoglobin and low iron.  B12 and folate normal.  Labs consistent with iron deficiency anemia.  She is scheduled for EGD and colonoscopy 02/12/2023 with Dr. Tobi Bastos to further evaluate sources of iron deficiency anemia.  Barium swallow test was ordered to evaluate dysphagia, however patient did not complete barium swallow test.  Patient was admitted to [facility] hospital 12/27/2022 until 12/29/2022 with acute metabolic encephalopathy, acute renal failure, CKD 3, hypotension, abdominal wall hematoma, uncontrolled type 2 diabetes insulin-dependent, anemia, mild cognitive impairment, history of cocaine use, COPD.  Cardiac workup (Lexiscan and echo) 01/2022 unrevealing.  Has chronic pain, fibromyalgia, and mild cognitive impairment.   CT abdomen and pelvis 12/28/22 showed: Hematoma of abdominal wall, Abdominal wall contusion versus cellulitis.   Anemia: Lab 12/29/2022: Glucose 216, BUN 54, creatinine 2.5, GFR 19, hemoglobin 9.0, MCV 88.  Repeat lab 01/01/2023: Hemoglobin 9.5, total iron 32, iron saturation 13.  Currently taking ferrous sulfate 325 mg daily.  81 mg aspirin.  No other blood thinners.   EGD 05/2016 by Dr. Marva Panda showed small hiatal hernia, widely patent Schatzki's ring, food in the stomach.   Colonoscopy 05/2016 by Dr. Marva Panda showed poor prep and procedure was aborted.  No repeat since then.  Current Outpatient Medications  Medication Sig Dispense Refill    acetaminophen (TYLENOL) 325 MG tablet Take 2 tablets (650 mg total) by mouth every 6 (six) hours as needed. 30 tablet 1   albuterol (VENTOLIN HFA) 108 (90 Base) MCG/ACT inhaler Inhale 2 puffs into the lungs every 6 (six) hours as needed for wheezing or shortness of breath.      aspirin EC 81 MG tablet Take 81 mg by mouth daily.     blood glucose meter kit and supplies KIT Dispense based on patient and insurance preference. Use up to four times daily as directed. (FOR ICD-9 250.00, 250.01). 1 each 0   Cholecalciferol (VITAMIN D3) 50 MCG (2000 UT) TABS Take 2,000 Units by mouth daily.     ezetimibe (ZETIA) 10 MG tablet Take 10 mg by mouth daily.     FEROSUL 325 (65 Fe) MG tablet Take 325 mg by mouth daily with breakfast.     fluticasone furoate-vilanterol (BREO ELLIPTA) 100-25 MCG/INH AEPB Inhale 1 puff into the lungs daily.     furosemide (LASIX) 40 MG tablet Take 1 tablet (40 mg total) by mouth 3 (three) times a week. 30 tablet 0   gabapentin (NEURONTIN) 300 MG capsule Take by mouth 3 (three) times daily.     HYDROcodone-acetaminophen (NORCO/VICODIN) 5-325 MG tablet Take 2 tablets by mouth every 6 (six) hours as needed for moderate pain (pain score 4-6).     insulin glargine (LANTUS) 100 UNIT/ML Solostar Pen Inject 20 Units into the skin daily. 15 mL 11   isosorbide mononitrate (IMDUR) 30 MG 24 hr tablet Take 30 mg by mouth daily.     loratadine (CLARITIN) 10 MG tablet Take 10 mg by mouth daily.  Multiple Vitamin (MULTIVITAMIN WITH MINERALS) TABS tablet Take 1 tablet by mouth daily. 120 tablet 0   nortriptyline (PAMELOR) 50 MG capsule Take 50 mg by mouth at bedtime.     Olopatadine HCl 0.2 % SOLN      omeprazole (PRILOSEC) 20 MG capsule Take 20 mg by mouth daily.     omeprazole (PRILOSEC) 40 MG capsule Take 1 capsule (40 mg total) by mouth daily. 30 capsule 2   RELION PEN NEEDLE 31G/8MM 31G X 8 MM MISC USE AS DIRECTED FOR INSULIN INJECTIONS     sitaGLIPtin (JANUVIA) 100 MG tablet Take 100 mg  by mouth daily.     tiotropium (SPIRIVA) 18 MCG inhalation capsule Place 18 mcg into inhaler and inhale daily.     TRUEPLUS PEN NEEDLES 31G X 6 MM MISC      venlafaxine XR (EFFEXOR-XR) 75 MG 24 hr capsule Take 225 mg by mouth daily with breakfast.     No current facility-administered medications for this visit.    Allergies as of 02/05/2023 - Review Complete 01/01/2023  Allergen Reaction Noted   Ace inhibitors Other (See Comments) and Cough 01/04/2015   Bee pollen Other (See Comments) 05/21/2016   Pollen extracts [pollen extract] Other (See Comments) 05/21/2016   Vicodin [hydrocodone-acetaminophen] Nausea And Vomiting 05/21/2016    Past Medical History:  Diagnosis Date   Asthma    Atypical chest pain    [facility]TT [facility]   Body mass index (BMI) 31.0-31.9, adult 04/05/2017   CKD (chronic kidney disease), stage III (HCC)    Cocaine abuse, uncomplicated (HCC) 07/24/2013   Depression    Diabetes mellitus without complication (HCC)    Fibromyalgia    HLD (hyperlipidemia)    Hypertension    Mild cognitive impairment 06/21/2014   Stable angina pectoris (HCC) 10/24/2021   Vitamin D deficiency     Past Surgical History:  Procedure Laterality Date   ABDOMINAL HYSTERECTOMY     COLONOSCOPY WITH PROPOFOL N/A 05/22/2016   Procedure: COLONOSCOPY WITH PROPOFOL;  Surgeon: Christena Deem, MD;  Location: [facility] ENDOSCOPY;  Service: Endoscopy;  Laterality: N/A;   ESOPHAGOGASTRODUODENOSCOPY (EGD) WITH PROPOFOL N/A 05/22/2016   Procedure: ESOPHAGOGASTRODUODENOSCOPY (EGD) WITH PROPOFOL;  Surgeon: Christena Deem, MD;  Location: [facility] ENDOSCOPY;  Service: Endoscopy;  Laterality: N/A;   HAND SURGERY Right 2014   Dr Rosita Kea   rotator cuff surgery Left 03/2013   DR Rosita Kea   SHOULDER ARTHROSCOPY WITH OPEN ROTATOR CUFF REPAIR Right 04/25/2017   Procedure: SHOULDER ARTHROSCOPY WITH OPEN ROTATOR CUFF REPAIR;  Surgeon: Christena Flake, MD;  Location: [facility] ORS;  Service: Orthopedics;  Laterality: Right;    SHOULDER ARTHROSCOPY WITH OPEN ROTATOR CUFF REPAIR Right 03/11/2018   Procedure: SHOULDER ARTHROSCOPY WITH Recurrent  OPEN ROTATOR CUFF REPAIR;  Surgeon: Christena Flake, MD;  Location: [facility] ORS;  Service: Orthopedics;  Laterality: Right;  debridment and decompression    Review of Systems:    All systems reviewed and negative except where noted in HPI.   Physical Examination:   There were no vitals taken for this visit.  General: Well-nourished, well-developed in no acute distress.  Lungs: Clear to auscultation bilaterally. Non-labored. Heart: Regular rate and rhythm, no murmurs rubs or gallops.  Abdomen: Bowel sounds are normal; Abdomen is Soft; No hepatosplenomegaly, masses or hernias;  No Abdominal Tenderness; No guarding or rebound tenderness. Neuro: Alert and oriented x 3.  Grossly intact.  Psych: Alert and cooperative, normal mood and affect.   Imaging Studies: No results  found.  Assessment and Plan:   SOFIYA ASHBRIDGE is a 77 y.o. y/o female returns for follow-up of  1.  Iron deficiency anemia  Repeat CBC, iron, ferritin  Proceed with EGD and colonoscopy - 2 day prep, Golytely.  2.  Dysphagia  Encouraged patient to complete barium swallow test that was ordered  3.  GERD  Continue omeprazole 40 Mg daily  4.  Constipation  Continue MiraLAX daily  5.  Stage IV chronic kidney disease  Repeat BMP lab    Celso Amy, PA-C  Follow up ***  BP check ***

## 2023-02-12 ENCOUNTER — Ambulatory Visit: Payer: 59 | Admitting: Certified Registered"

## 2023-02-12 ENCOUNTER — Encounter: Payer: Self-pay | Admitting: Gastroenterology

## 2023-02-12 ENCOUNTER — Encounter
Admission: RE | Disposition: A | Discharge status: Home / Self Care | Payer: Self-pay | Source: Home / Self Care | Attending: Gastroenterology

## 2023-02-12 ENCOUNTER — Ambulatory Visit
Admission: RE | Admit: 2023-02-12 | Discharge: 2023-02-12 | Disposition: A | Discharge status: Home / Self Care | Payer: 59 | Attending: Gastroenterology | Admitting: Gastroenterology

## 2023-02-12 DIAGNOSIS — N183 Chronic kidney disease, stage 3 unspecified: Secondary | ICD-10-CM | POA: Diagnosis not present

## 2023-02-12 DIAGNOSIS — D649 Anemia, unspecified: Secondary | ICD-10-CM

## 2023-02-12 DIAGNOSIS — Z5986 Financial insecurity: Secondary | ICD-10-CM | POA: Diagnosis not present

## 2023-02-12 DIAGNOSIS — R1310 Dysphagia, unspecified: Secondary | ICD-10-CM

## 2023-02-12 DIAGNOSIS — D509 Iron deficiency anemia, unspecified: Secondary | ICD-10-CM | POA: Insufficient documentation

## 2023-02-12 DIAGNOSIS — I129 Hypertensive chronic kidney disease with stage 1 through stage 4 chronic kidney disease, or unspecified chronic kidney disease: Secondary | ICD-10-CM | POA: Insufficient documentation

## 2023-02-12 DIAGNOSIS — K222 Esophageal obstruction: Secondary | ICD-10-CM | POA: Diagnosis not present

## 2023-02-12 DIAGNOSIS — K219 Gastro-esophageal reflux disease without esophagitis: Secondary | ICD-10-CM | POA: Diagnosis not present

## 2023-02-12 DIAGNOSIS — J4489 Other specified chronic obstructive pulmonary disease: Secondary | ICD-10-CM | POA: Diagnosis not present

## 2023-02-12 DIAGNOSIS — G709 Myoneural disorder, unspecified: Secondary | ICD-10-CM | POA: Insufficient documentation

## 2023-02-12 DIAGNOSIS — K317 Polyp of stomach and duodenum: Secondary | ICD-10-CM | POA: Insufficient documentation

## 2023-02-12 DIAGNOSIS — E1122 Type 2 diabetes mellitus with diabetic chronic kidney disease: Secondary | ICD-10-CM | POA: Diagnosis not present

## 2023-02-12 DIAGNOSIS — K3189 Other diseases of stomach and duodenum: Secondary | ICD-10-CM | POA: Diagnosis not present

## 2023-02-12 HISTORY — DX: Other fracture of right lower leg, initial encounter for closed fracture: S82.891A

## 2023-02-12 HISTORY — PX: COLONOSCOPY WITH PROPOFOL: SHX5780

## 2023-02-12 HISTORY — PX: ESOPHAGOGASTRODUODENOSCOPY (EGD) WITH PROPOFOL: SHX5813

## 2023-02-12 HISTORY — PX: BIOPSY: SHX5522

## 2023-02-12 LAB — GLUCOSE, CAPILLARY: Glucose-Capillary: 95 mg/dL (ref 70–99)

## 2023-02-12 SURGERY — COLONOSCOPY WITH PROPOFOL
Anesthesia: General

## 2023-02-12 MED ORDER — PROPOFOL 10 MG/ML IV BOLUS
INTRAVENOUS | Status: DC | PRN
  Administered 2023-02-12: 70 mg via INTRAVENOUS

## 2023-02-12 MED ORDER — LIDOCAINE 2% (20 MG/ML) 5 ML SYRINGE
INTRAMUSCULAR | Status: DC | PRN
  Administered 2023-02-12: 20 mg via INTRAVENOUS

## 2023-02-12 MED ORDER — GLYCOPYRROLATE 0.2 MG/ML IJ SOLN
INTRAMUSCULAR | Status: DC | PRN
  Administered 2023-02-12: .2 mg via INTRAVENOUS

## 2023-02-12 MED ORDER — SODIUM CHLORIDE 0.9 % IV SOLN: INTRAVENOUS | Status: DC

## 2023-02-12 MED ORDER — PROPOFOL 500 MG/50ML IV EMUL
INTRAVENOUS | Status: DC | PRN
  Administered 2023-02-12: 120 ug/kg/min via INTRAVENOUS

## 2023-02-12 NOTE — Op Note (Signed)
[facility] Gastroenterology Patient Name: Theresa Booker Procedure Date: 02/12/2023 8:51 AM MRN: [id number] Account #: [id number] Date of Birth: February 29, 1946 Admit Type: Outpatient Age: 77 Room: [facility] ENDO ROOM 2 Gender: Female Note Status: Finalized Instrument Name: Nelda Marseille [id number] Procedure:             Colonoscopy Indications:           Iron deficiency anemia Providers:             Wyline Mood MD, MD Referring MD:          Jeris Penta. Richarda Blade (Referring MD) Medicines:             Monitored Anesthesia Care Complications:         No immediate complications. Procedure:             Pre-Anesthesia Assessment:                        - Prior to the procedure, a History and Physical was                         performed, and patient medications, allergies and                         sensitivities were reviewed. The patient's tolerance                         of previous anesthesia was reviewed.                        - The risks and benefits of the procedure and the                         sedation options and risks were discussed with the                         patient. All questions were answered and informed                         consent was obtained.                        - ASA Grade Assessment: II - A patient with mild                         systemic disease.                        After obtaining informed consent, the colonoscope was                         passed under direct vision. Throughout the procedure,                         the patient's blood pressure, pulse, and oxygen                         saturations were monitored continuously. The                         Colonoscope was introduced through the  anus with the                         intention of advancing to the cecum. The scope was                         advanced to the descending colon before the procedure                         was aborted. Medications were given. The colonoscopy                          was performed with ease. The patient tolerated the                         procedure well. The quality of the bowel preparation                         was inadequate. Findings:      A large amount of semi-liquid stool was found in the recto-sigmoid colon       and in the descending colon, interfering with visualization. Impression:            - Preparation of the colon was inadequate.                        - Stool in the recto-sigmoid colon and in the                         descending colon.                        - No specimens collected. Recommendation:        - Discharge patient to home (with escort).                        - Resume previous diet.                        - Continue present medications.                        - Repeat colonoscopy in 2 weeks because the bowel                         preparation was suboptimal. Procedure Code(s):     --- Professional ---                        [phone], 53, Colonoscopy, flexible; diagnostic,                         including collection of specimen(s) by brushing or                         washing, when performed (separate procedure) Diagnosis Code(s):     --- Professional ---                        D50.9, Iron deficiency anemia, unspecified CPT copyright 2022 American Medical Association. All rights reserved. The codes documented  in this report are preliminary and upon coder review may  be revised to meet current compliance requirements. Wyline Mood, MD Wyline Mood MD, MD 02/12/2023 9:09:57 AM This report has been signed electronically. Number of Addenda: 0 Note Initiated On: 02/12/2023 8:51 AM Total Procedure Duration: 0 hours 2 minutes 14 seconds  Estimated Blood Loss:  Estimated blood loss: none.      [facility]

## 2023-02-12 NOTE — Anesthesia Preprocedure Evaluation (Signed)
Anesthesia Evaluation  Patient identified by MRN, date of birth, ID band Patient awake    Reviewed: Allergy & Precautions, NPO status , Patient's Chart, lab work & pertinent test results  History of Anesthesia Complications Negative for: history of anesthetic complications  Airway Mallampati: III  TM Distance: <3 FB Neck ROM: full    Dental  (+) Upper Dentures, Lower Dentures   Pulmonary asthma , COPD   Pulmonary exam normal        Cardiovascular Exercise Tolerance: Good hypertension, (-) angina Normal cardiovascular exam     Neuro/Psych  Headaches PSYCHIATRIC DISORDERS       Neuromuscular disease    GI/Hepatic Neg liver ROS,GERD  Controlled,,  Endo/Other  negative endocrine ROSdiabetes    Renal/GU Renal disease  negative genitourinary   Musculoskeletal   Abdominal   Peds  Hematology negative hematology ROS (+)   Anesthesia Other Findings Past Medical History: No date: Asthma No date: Atypical chest pain     Comment:  neg ETT at [facility] 04/05/2017: Body mass index (BMI) 31.0-31.9, adult No date: CKD (chronic kidney disease), stage III (HCC) 07/24/2013: Cocaine abuse, uncomplicated (HCC) No date: Depression No date: Diabetes mellitus without complication (HCC) No date: Fibromyalgia No date: HLD (hyperlipidemia) No date: Hypertension 06/21/2014: Mild cognitive impairment 10/24/2021: Stable angina pectoris (HCC) No date: Vitamin D deficiency  Past Surgical History: No date: ABDOMINAL HYSTERECTOMY 05/22/2016: COLONOSCOPY WITH PROPOFOL; N/A     Comment:  Procedure: COLONOSCOPY WITH PROPOFOL;  Surgeon: Christena Deem, MD;  Location: [facility] ENDOSCOPY;  Service:               Endoscopy;  Laterality: N/A; 05/22/2016: ESOPHAGOGASTRODUODENOSCOPY (EGD) WITH PROPOFOL; N/A     Comment:  Procedure: ESOPHAGOGASTRODUODENOSCOPY (EGD) WITH               PROPOFOL;  Surgeon: Christena Deem, MD;  Location:                [facility] ENDOSCOPY;  Service: Endoscopy;  Laterality: N/A; 2014: HAND SURGERY; Right     Comment:  Dr Rosita Kea 03/2013: rotator cuff surgery; Left     Comment:  DR Rosita Kea 04/25/2017: SHOULDER ARTHROSCOPY WITH OPEN ROTATOR CUFF REPAIR; Right     Comment:  Procedure: SHOULDER ARTHROSCOPY WITH OPEN ROTATOR CUFF               REPAIR;  Surgeon: Christena Flake, MD;  Location: [facility] ORS;              Service: Orthopedics;  Laterality: Right; 03/11/2018: SHOULDER ARTHROSCOPY WITH OPEN ROTATOR CUFF REPAIR; Right     Comment:  Procedure: SHOULDER ARTHROSCOPY WITH Recurrent  OPEN               ROTATOR CUFF REPAIR;  Surgeon: Christena Flake, MD;                Location: [facility] ORS;  Service: Orthopedics;  Laterality:               Right;  debridment and decompression     Reproductive/Obstetrics negative OB ROS                             Anesthesia Physical Anesthesia Plan  ASA: 3  Anesthesia Plan: General   Post-op Pain Management:    Induction: Intravenous  PONV Risk Score and Plan: Propofol infusion  and TIVA  Airway Management Planned: Natural Airway and Nasal Cannula  Additional Equipment:   Intra-op Plan:   Post-operative Plan:   Informed Consent: I have reviewed the patients History and Physical, chart, labs and discussed the procedure including the risks, benefits and alternatives for the proposed anesthesia with the patient or authorized representative who has indicated his/her understanding and acceptance.     Dental Advisory Given  Plan Discussed with: Anesthesiologist, CRNA and Surgeon  Anesthesia Plan Comments: (Patient consented for risks of anesthesia including but not limited to:  - adverse reactions to medications - risk of airway placement if required - damage to eyes, teeth, lips or other oral mucosa - nerve damage due to positioning  - sore throat or hoarseness - Damage to heart, brain, nerves, lungs, other parts of body or loss of  life  Patient voiced understanding and assent.)       Anesthesia Quick Evaluation

## 2023-02-12 NOTE — Anesthesia Postprocedure Evaluation (Signed)
Anesthesia Post Note  Patient: ADISYN KAMAN  Procedure(s) Performed: COLONOSCOPY WITH PROPOFOL ESOPHAGOGASTRODUODENOSCOPY (EGD) WITH PROPOFOL BIOPSY  Patient location during evaluation: Endoscopy Anesthesia Type: General Level of consciousness: awake and alert Pain management: pain level controlled Vital Signs Assessment: post-procedure vital signs reviewed and stable Respiratory status: spontaneous breathing, nonlabored ventilation, respiratory function stable and patient connected to nasal cannula oxygen Cardiovascular status: blood pressure returned to baseline and stable Postop Assessment: no apparent nausea or vomiting Anesthetic complications: no   No notable events documented.   Last Vitals:  Vitals:   02/12/23 0914 02/12/23 0924  BP: (!) 94/50 120/67  Pulse: 84 88  Resp: 18 19  Temp:    SpO2: 93% 97%    Last Pain:  Vitals:   02/12/23 0824  TempSrc: Temporal                 Cleda Mccreedy Hommer Cunliffe

## 2023-02-12 NOTE — Op Note (Signed)
[facility] Gastroenterology Patient Name: Theresa Booker Procedure Date: 02/12/2023 8:52 AM MRN: [id number] Account #: [id number] Date of Birth: 03/16/1945 Admit Type: Outpatient Age: 77 Room: [facility] ENDO ROOM 2 Gender: Female Note Status: Finalized Instrument Name: Laurette Schimke [id number] Procedure:             Upper GI endoscopy Indications:           Iron deficiency anemia Providers:             Wyline Mood MD, MD Referring MD:          Jeris Penta. Richarda Blade (Referring MD) Medicines:             Monitored Anesthesia Care Complications:         No immediate complications. Procedure:             Pre-Anesthesia Assessment:                        - Prior to the procedure, a History and Physical was                         performed, and patient medications, allergies and                         sensitivities were reviewed. The patient's tolerance                         of previous anesthesia was reviewed.                        - The risks and benefits of the procedure and the                         sedation options and risks were discussed with the                         patient. All questions were answered and informed                         consent was obtained.                        - ASA Grade Assessment: II - A patient with mild                         systemic disease.                        After obtaining informed consent, the endoscope was                         passed under direct vision. Throughout the procedure,                         the patient's blood pressure, pulse, and oxygen                         saturations were monitored continuously. The Endoscope                         was introduced  through the mouth, and advanced to the                         third part of duodenum. The upper GI endoscopy was                         accomplished with ease. The patient tolerated the                         procedure well. Findings:      One  benign-appearing, intrinsic mild stenosis was found at the       gastroesophageal junction. This stenosis measured 1.6 cm (inner       diameter). The stenosis was traversed.      The stomach was normal.      Two small sessile polyps were found in the duodenal bulb. Biopsies were       taken with a cold forceps for histology. only one polyp biopsied as       other appeared as possible pabncreatic duct opening with punctum Impression:            - Benign-appearing esophageal stenosis.                        - Normal stomach.                        - Two duodenal polyps. Biopsied. Recommendation:        - Discharge patient to home.                        - Await pathology results. Procedure Code(s):     --- Professional ---                        [phone], Esophagogastroduodenoscopy, flexible,                         transoral; with biopsy, single or multiple Diagnosis Code(s):     --- Professional ---                        K22.2, Esophageal obstruction                        K31.7, Polyp of stomach and duodenum                        D50.9, Iron deficiency anemia, unspecified CPT copyright 2022 American Medical Association. All rights reserved. The codes documented in this report are preliminary and upon coder review may  be revised to meet current compliance requirements. Wyline Mood, MD Wyline Mood MD, MD 02/12/2023 9:05:07 AM This report has been signed electronically. Number of Addenda: 0 Note Initiated On: 02/12/2023 8:52 AM Estimated Blood Loss:  Estimated blood loss: none.      [facility]

## 2023-02-12 NOTE — Transfer of Care (Signed)
Immediate Anesthesia Transfer of Care Note  Patient: Theresa Booker  Procedure(s) Performed: COLONOSCOPY WITH PROPOFOL ESOPHAGOGASTRODUODENOSCOPY (EGD) WITH PROPOFOL BIOPSY  Patient Location: Endoscopy Unit  Anesthesia Type:General  Level of Consciousness: drowsy  Airway & Oxygen Therapy: Patient Spontanous Breathing  Post-op Assessment: Report given to RN and Post -op Vital signs reviewed and stable  Post vital signs: Reviewed  Last Vitals:  Vitals Value Taken Time  BP 94/50 02/12/23 0914  Temp    Pulse 84 02/12/23 0914  Resp 18 02/12/23 0914  SpO2 93 % 02/12/23 0914    Last Pain:  Vitals:   02/12/23 0824  TempSrc: Temporal         Complications: No notable events documented.

## 2023-02-12 NOTE — H&P (Signed)
Wyline Mood, MD [address], [place], [place], [address] [address], [address], [place], [place], [address] Phone: [phone]  Fax: [phone]  Primary Care Physician:  Abram Sander, MD   Pre-Procedure History & Physical: HPI:  Theresa Booker is a 77 y.o. female is here for an endoscopy and colonoscopy    Past Medical History:  Diagnosis Date   Asthma    Atypical chest pain    neg ETT at [facility]   Body mass index (BMI) 31.0-31.9, adult 04/05/2017   CKD (chronic kidney disease), stage III (HCC)    Cocaine abuse, uncomplicated (HCC) 07/24/2013   Depression    Diabetes mellitus without complication (HCC)    Fibromyalgia    HLD (hyperlipidemia)    Hypertension    Mild cognitive impairment 06/21/2014   Stable angina pectoris (HCC) 10/24/2021   Vitamin D deficiency     Past Surgical History:  Procedure Laterality Date   ABDOMINAL HYSTERECTOMY     COLONOSCOPY WITH PROPOFOL N/A 05/22/2016   Procedure: COLONOSCOPY WITH PROPOFOL;  Surgeon: Christena Deem, MD;  Location: [facility] ENDOSCOPY;  Service: Endoscopy;  Laterality: N/A;   ESOPHAGOGASTRODUODENOSCOPY (EGD) WITH PROPOFOL N/A 05/22/2016   Procedure: ESOPHAGOGASTRODUODENOSCOPY (EGD) WITH PROPOFOL;  Surgeon: Christena Deem, MD;  Location: [facility] ENDOSCOPY;  Service: Endoscopy;  Laterality: N/A;   HAND SURGERY Right 2014   Dr Rosita Kea   rotator cuff surgery Left 03/2013   DR Rosita Kea   SHOULDER ARTHROSCOPY WITH OPEN ROTATOR CUFF REPAIR Right 04/25/2017   Procedure: SHOULDER ARTHROSCOPY WITH OPEN ROTATOR CUFF REPAIR;  Surgeon: Christena Flake, MD;  Location: [facility] ORS;  Service: Orthopedics;  Laterality: Right;   SHOULDER ARTHROSCOPY WITH OPEN ROTATOR CUFF REPAIR Right 03/11/2018   Procedure: SHOULDER ARTHROSCOPY WITH Recurrent  OPEN ROTATOR CUFF REPAIR;  Surgeon: Christena Flake, MD;  Location: [facility] ORS;  Service: Orthopedics;  Laterality: Right;  debridment and decompression    Prior to Admission medications   Medication  Sig Start Date End Date Taking? Authorizing Provider  acetaminophen (TYLENOL) 325 MG tablet Take 2 tablets (650 mg total) by mouth every 6 (six) hours as needed. 08/21/22   Cameron Ali, PA-C  albuterol (VENTOLIN HFA) 108 (90 Base) MCG/ACT inhaler Inhale 2 puffs into the lungs every 6 (six) hours as needed for wheezing or shortness of breath.     [provider]  aspirin EC 81 MG tablet Take 81 mg by mouth daily. 03/02/20   [provider]  blood glucose meter kit and supplies KIT Dispense based on patient and insurance preference. Use up to four times daily as directed. (FOR ICD-9 250.00, 250.01). 08/11/18   Katha Hamming, MD  Cholecalciferol (VITAMIN D3) 50 MCG (2000 UT) TABS Take 2,000 Units by mouth daily.    [provider]  ezetimibe (ZETIA) 10 MG tablet Take 10 mg by mouth daily. 06/06/20   [provider]  FEROSUL 325 (65 Fe) MG tablet Take 325 mg by mouth daily with breakfast. 07/20/21   [provider]  fluticasone furoate-vilanterol (BREO ELLIPTA) 100-25 MCG/INH AEPB Inhale 1 puff into the lungs daily. 01/22/19   [provider]  furosemide (LASIX) 40 MG tablet Take 1 tablet (40 mg total) by mouth 3 (three) times a week. 08/07/21   Rolly Salter, MD  gabapentin (NEURONTIN) 300 MG capsule Take by mouth 3 (three) times daily. 04/02/16   [provider]  HYDROcodone-acetaminophen (NORCO/VICODIN) 5-325 MG tablet Take 2 tablets by mouth  every 6 (six) hours as needed for moderate pain (pain score 4-6). 12/31/22   [provider]  insulin glargine (LANTUS) 100 UNIT/ML Solostar Pen Inject 20 Units into the skin daily. 08/05/21   Rolly Salter, MD  isosorbide mononitrate (IMDUR) 30 MG 24 hr tablet Take 30 mg by mouth daily.    [provider]  loratadine (CLARITIN) 10 MG tablet Take 10 mg by mouth daily.    [provider]  Multiple Vitamin (MULTIVITAMIN WITH MINERALS) TABS tablet Take 1 tablet by mouth  daily. 07/31/18   Salary, Evelena Asa, MD  nortriptyline (PAMELOR) 50 MG capsule Take 50 mg by mouth at bedtime.    [provider]  Olopatadine HCl 0.2 % SOLN  09/20/21   [provider]  omeprazole (PRILOSEC) 20 MG capsule Take 20 mg by mouth daily. 01/21/23   [provider]  omeprazole (PRILOSEC) 40 MG capsule Take 1 capsule (40 mg total) by mouth daily. 01/01/23 04/01/23  Celso Amy, PA-C  RELION PEN NEEDLE 31G/8MM 31G X 8 MM MISC USE AS DIRECTED FOR INSULIN INJECTIONS    [provider]  sitaGLIPtin (JANUVIA) 100 MG tablet Take 100 mg by mouth daily.    [provider]  tiotropium (SPIRIVA) 18 MCG inhalation capsule Place 18 mcg into inhaler and inhale daily.    [provider]  TRUEPLUS PEN NEEDLES 31G X 6 MM MISC  10/12/22   [provider]  venlafaxine XR (EFFEXOR-XR) 75 MG 24 hr capsule Take 225 mg by mouth daily with breakfast.    [provider]    Allergies as of 01/02/2023 - Review Complete 01/01/2023  Allergen Reaction Noted   Ace inhibitors Other (See Comments) and Cough 01/04/2015   Bee pollen Other (See Comments) 05/21/2016   Pollen extracts [pollen extract] Other (See Comments) 05/21/2016   Vicodin [hydrocodone-acetaminophen] Nausea And Vomiting 05/21/2016    Family History  Problem Relation Age of Onset   Diabetes Mother    Breast cancer Mother 8   Cancer Sister    Cancer Father    Liver disease Brother    Breast cancer Maternal Aunt    Breast cancer Cousin        maternal    Social History   Socioeconomic History   Marital status: Widowed    Spouse name: Not on file   Number of children: Not on file   Years of education: Not on file   Highest education level: Not on file  Occupational History   Not on file  Tobacco Use   Smoking status: Never   Smokeless tobacco: Never  Vaping Use   Vaping status: Never Used  Substance and Sexual Activity   Alcohol use: No    Alcohol/week: 0.0  standard drinks of alcohol   Drug use: No   Sexual activity: Not Currently  Other Topics Concern   Not on file  Social History Narrative   Not on file   Social Determinants of Health   Financial Resource Strain: Medium Risk (07/29/2018)   Overall Financial Resource Strain (CARDIA)    Difficulty of Paying Living Expenses: Somewhat hard  Food Insecurity: Food Insecurity Present (06/05/2019)   Received from [facility], [facility]   Hunger Vital Sign    Worried About Running Out of Food in the Last Year: Often true    Ran Out of Food in the Last Year: Often true  Transportation Needs: Unknown (07/29/2018)   PRAPARE - Transportation  Lack of Transportation (Medical): Patient declined    Lack of Transportation (Non-Medical): Patient declined  Physical Activity: Unknown (07/29/2018)   Exercise Vital Sign    Days of Exercise per Week: Patient declined    Minutes of Exercise per Session: Patient declined  Stress: No Stress Concern Present (07/29/2018)   [employer] of Occupational Health - Occupational Stress Questionnaire    Feeling of Stress : Only a little  Social Connections: Unknown (07/29/2018)   Social Connection and Isolation Panel [NHANES]    Frequency of Communication with Friends and Family: Patient declined    Frequency of Social Gatherings with Friends and Family: Patient declined    Attends Religious Services: Patient declined    Database administrator or Organizations: Patient declined    Attends Banker Meetings: Patient declined    Marital Status: Patient declined  Intimate Partner Violence: Unknown (07/29/2018)   Humiliation, Afraid, Rape, and Kick questionnaire    Fear of Current or Ex-Partner: Patient declined    Emotionally Abused: Patient declined    Physically Abused: Patient declined    Sexually Abused: Patient declined    Review of Systems: See HPI, otherwise negative ROS  Physical Exam: There were no vitals taken for this  visit. General:   Alert,  pleasant and cooperative in NAD Head:  Normocephalic and atraumatic. Neck:  Supple; no masses or thyromegaly. Lungs:  Clear throughout to auscultation, normal respiratory effort.    Heart:  +S1, +S2, Regular rate and rhythm, No edema. Abdomen:  Soft, nontender and nondistended. Normal bowel sounds, without guarding, and without rebound.   Neurologic:  Alert and  oriented x4;  grossly normal neurologically.  Impression/Plan: Theresa Booker is here for an endoscopy and colonoscopy  to be performed for  evaluation of iron deficiency anemia    Risks, benefits, limitations, and alternatives regarding endoscopy have been reviewed with the patient.  Questions have been answered.  All parties agreeable.   Wyline Mood, MD  02/12/2023, 8:10 AM

## 2023-02-13 LAB — SURGICAL PATHOLOGY

## 2023-02-28 ENCOUNTER — Other Ambulatory Visit: Payer: Self-pay

## 2023-02-28 ENCOUNTER — Telehealth: Payer: Self-pay

## 2023-02-28 DIAGNOSIS — K5900 Constipation, unspecified: Secondary | ICD-10-CM

## 2023-02-28 DIAGNOSIS — D649 Anemia, unspecified: Secondary | ICD-10-CM

## 2023-02-28 MED ORDER — PEG 3350-KCL-NA BICARB-NACL 420 G PO SOLR: 4000.0000 mL | Freq: Once | ORAL | 0 refills | Status: AC

## 2023-02-28 NOTE — Telephone Encounter (Signed)
Spoke with patient-colonoscopy scheduled 04/02/23 with Dr.anna-Due to poor bowel prep- patient will hold 2 days prior- take half dose insulin the night before your procedure.Mailed instuctions to patient- sent Golytely to pharmacy.

## 2023-02-28 NOTE — Telephone Encounter (Signed)
Left message to call office so we can schedule repeat colonoscopy due to bowel prep suboptimal. Patient will need to Hold diabetic medications prior.

## 2023-02-28 NOTE — Telephone Encounter (Signed)
Patient called back in to speak with the nurse.

## 2023-04-02 ENCOUNTER — Ambulatory Visit
Admission: RE | Admit: 2023-04-02 | Discharge: 2023-04-02 | Disposition: A | Discharge status: Home / Self Care | Payer: 59 | Attending: Gastroenterology | Admitting: Gastroenterology

## 2023-04-02 ENCOUNTER — Encounter: Payer: Self-pay | Admitting: Gastroenterology

## 2023-04-02 ENCOUNTER — Ambulatory Visit: Payer: 59 | Admitting: Anesthesiology

## 2023-04-02 ENCOUNTER — Encounter
Admission: RE | Disposition: A | Discharge status: Home / Self Care | Payer: Self-pay | Source: Home / Self Care | Attending: Gastroenterology

## 2023-04-02 DIAGNOSIS — Z794 Long term (current) use of insulin: Secondary | ICD-10-CM | POA: Insufficient documentation

## 2023-04-02 DIAGNOSIS — D649 Anemia, unspecified: Secondary | ICD-10-CM

## 2023-04-02 DIAGNOSIS — D126 Benign neoplasm of colon, unspecified: Secondary | ICD-10-CM

## 2023-04-02 DIAGNOSIS — K5730 Diverticulosis of large intestine without perforation or abscess without bleeding: Secondary | ICD-10-CM | POA: Diagnosis not present

## 2023-04-02 DIAGNOSIS — I129 Hypertensive chronic kidney disease with stage 1 through stage 4 chronic kidney disease, or unspecified chronic kidney disease: Secondary | ICD-10-CM | POA: Diagnosis not present

## 2023-04-02 DIAGNOSIS — K5900 Constipation, unspecified: Secondary | ICD-10-CM

## 2023-04-02 DIAGNOSIS — M797 Fibromyalgia: Secondary | ICD-10-CM | POA: Insufficient documentation

## 2023-04-02 DIAGNOSIS — K635 Polyp of colon: Secondary | ICD-10-CM

## 2023-04-02 DIAGNOSIS — J4489 Other specified chronic obstructive pulmonary disease: Secondary | ICD-10-CM | POA: Diagnosis not present

## 2023-04-02 DIAGNOSIS — E1122 Type 2 diabetes mellitus with diabetic chronic kidney disease: Secondary | ICD-10-CM | POA: Insufficient documentation

## 2023-04-02 DIAGNOSIS — D509 Iron deficiency anemia, unspecified: Secondary | ICD-10-CM | POA: Insufficient documentation

## 2023-04-02 DIAGNOSIS — K219 Gastro-esophageal reflux disease without esophagitis: Secondary | ICD-10-CM | POA: Diagnosis not present

## 2023-04-02 DIAGNOSIS — D122 Benign neoplasm of ascending colon: Secondary | ICD-10-CM | POA: Insufficient documentation

## 2023-04-02 DIAGNOSIS — N183 Chronic kidney disease, stage 3 unspecified: Secondary | ICD-10-CM | POA: Insufficient documentation

## 2023-04-02 HISTORY — PX: POLYPECTOMY: SHX5525

## 2023-04-02 HISTORY — PX: COLONOSCOPY WITH PROPOFOL: SHX5780

## 2023-04-02 HISTORY — PX: HEMOSTASIS CLIP PLACEMENT: SHX6857

## 2023-04-02 LAB — GLUCOSE, CAPILLARY
Glucose-Capillary: 120 mg/dL — ABNORMAL HIGH (ref 70–99)
Glucose-Capillary: 59 mg/dL — ABNORMAL LOW (ref 70–99)
Glucose-Capillary: 76 mg/dL (ref 70–99)

## 2023-04-02 SURGERY — COLONOSCOPY WITH PROPOFOL
Anesthesia: General

## 2023-04-02 MED ORDER — LIDOCAINE HCL (CARDIAC) PF 100 MG/5ML IV SOSY
PREFILLED_SYRINGE | INTRAVENOUS | Status: DC | PRN
  Administered 2023-04-02: 60 mg via INTRAVENOUS

## 2023-04-02 MED ORDER — DEXTROSE 50 % IV SOLN
25.0000 mL | Freq: Once | INTRAVENOUS | Status: AC
  Administered 2023-04-02: 25 mL via INTRAVENOUS

## 2023-04-02 MED ORDER — PROPOFOL 10 MG/ML IV BOLUS
INTRAVENOUS | Status: DC | PRN
  Administered 2023-04-02: 40 mg via INTRAVENOUS
  Administered 2023-04-02: 50 mg via INTRAVENOUS

## 2023-04-02 MED ORDER — DEXTROSE 50 % IV SOLN
INTRAVENOUS | Status: AC
  Filled 2023-04-02: qty 50

## 2023-04-02 MED ORDER — SODIUM CHLORIDE 0.9 % IV SOLN: INTRAVENOUS | Status: DC

## 2023-04-02 MED ORDER — DEXMEDETOMIDINE HCL IN NACL 80 MCG/20ML IV SOLN
INTRAVENOUS | Status: AC
Start: 2023-04-02 — End: ?
  Filled 2023-04-02: qty 20

## 2023-04-02 MED ORDER — PROPOFOL 500 MG/50ML IV EMUL
INTRAVENOUS | Status: DC | PRN
  Administered 2023-04-02: 75 ug/kg/min via INTRAVENOUS

## 2023-04-02 MED ORDER — LIDOCAINE HCL (PF) 2 % IJ SOLN
INTRAMUSCULAR | Status: AC
Start: 2023-04-02 — End: ?
  Filled 2023-04-02: qty 5

## 2023-04-02 NOTE — Progress Notes (Signed)
Blood sugar 59. Dr. Suzan Slick notified and 1/2 amp D50 adm. Patient states she is feeling better after the D50. Blood sugar now 120

## 2023-04-02 NOTE — H&P (Signed)
Wyline Mood, MD [address], [place], [place], [address] [address], [place], [place], [address] Phone: [phone]  Fax: [phone]  Primary Care Physician:  Abram Sander, MD   Pre-Procedure History & Physical: HPI:  CLETA HEATLEY is a 78 y.o. female is here for an colonoscopy.   Past Medical History:  Diagnosis Date   Asthma    Atypical chest pain    neg ETT at [facility]   Body mass index (BMI) 31.0-31.9, adult 04/05/2017   CKD (chronic kidney disease), stage III (HCC)    Closed right ankle fracture    Cocaine abuse, uncomplicated (HCC) 07/24/2013   Depression    Diabetes mellitus without complication (HCC)    Fibromyalgia    HLD (hyperlipidemia)    Hypertension    Mild cognitive impairment 06/21/2014   Stable angina pectoris (HCC) 10/24/2021   Vitamin D deficiency     Past Surgical History:  Procedure Laterality Date   ABDOMINAL HYSTERECTOMY     BIOPSY  02/12/2023   Procedure: BIOPSY;  Surgeon: Wyline Mood, MD;  Location: [facility] ENDOSCOPY;  Service: Gastroenterology;;   COLONOSCOPY WITH PROPOFOL N/A 05/22/2016   Procedure: COLONOSCOPY WITH PROPOFOL;  Surgeon: Christena Deem, MD;  Location: [facility] ENDOSCOPY;  Service: Endoscopy;  Laterality: N/A;   COLONOSCOPY WITH PROPOFOL N/A 02/12/2023   Procedure: COLONOSCOPY WITH PROPOFOL;  Surgeon: Wyline Mood, MD;  Location: [facility] ENDOSCOPY;  Service: Gastroenterology;  Laterality: N/A;   ESOPHAGOGASTRODUODENOSCOPY (EGD) WITH PROPOFOL N/A 05/22/2016   Procedure: ESOPHAGOGASTRODUODENOSCOPY (EGD) WITH PROPOFOL;  Surgeon: Christena Deem, MD;  Location: [facility] ENDOSCOPY;  Service: Endoscopy;  Laterality: N/A;   ESOPHAGOGASTRODUODENOSCOPY (EGD) WITH PROPOFOL N/A 02/12/2023   Procedure: ESOPHAGOGASTRODUODENOSCOPY (EGD) WITH PROPOFOL;  Surgeon: Wyline Mood, MD;  Location: [facility] ENDOSCOPY;  Service: Gastroenterology;  Laterality: N/A;   HAND SURGERY Right 2014   Dr Rosita Kea   rotator cuff surgery Left 03/2013    DR Rosita Kea   SHOULDER ARTHROSCOPY WITH OPEN ROTATOR CUFF REPAIR Right 04/25/2017   Procedure: SHOULDER ARTHROSCOPY WITH OPEN ROTATOR CUFF REPAIR;  Surgeon: Christena Flake, MD;  Location: [facility] ORS;  Service: Orthopedics;  Laterality: Right;   SHOULDER ARTHROSCOPY WITH OPEN ROTATOR CUFF REPAIR Right 03/11/2018   Procedure: SHOULDER ARTHROSCOPY WITH Recurrent  OPEN ROTATOR CUFF REPAIR;  Surgeon: Christena Flake, MD;  Location: [facility] ORS;  Service: Orthopedics;  Laterality: Right;  debridment and decompression    Prior to Admission medications   Medication Sig Start Date End Date Taking? Authorizing Provider  albuterol (VENTOLIN HFA) 108 (90 Base) MCG/ACT inhaler Inhale 2 puffs into the lungs every 6 (six) hours as needed for wheezing or shortness of breath.    Yes [provider]  aspirin EC 81 MG tablet Take 81 mg by mouth daily. 03/02/20  Yes [provider]  ezetimibe (ZETIA) 10 MG tablet Take 10 mg by mouth daily. 06/06/20  Yes [provider]  FEROSUL 325 (65 Fe) MG tablet Take 325 mg by mouth daily with breakfast. 07/20/21  Yes [provider]  fluticasone furoate-vilanterol (BREO ELLIPTA) 100-25 MCG/INH AEPB Inhale 1 puff into the lungs daily. 01/22/19  Yes [provider]  furosemide (LASIX) 40 MG tablet Take 1 tablet (40 mg total) by mouth 3 (three) times a week. 08/07/21  Yes Rolly Salter, MD  gabapentin (NEURONTIN) 300 MG capsule Take by mouth 3 (three) times daily. 04/02/16  Yes [provider]  insulin glargine (LANTUS) 100 UNIT/ML Solostar Pen Inject  20 Units into the skin daily. 08/05/21  Yes Rolly Salter, MD  isosorbide mononitrate (IMDUR) 30 MG 24 hr tablet Take 30 mg by mouth daily.   Yes [provider]  nortriptyline (PAMELOR) 50 MG capsule Take 50 mg by mouth at bedtime.   Yes [provider]  omeprazole (PRILOSEC) 20 MG capsule Take 20 mg by mouth daily. 01/21/23  Yes [provider]  tiotropium (SPIRIVA) 18  MCG inhalation capsule Place 18 mcg into inhaler and inhale daily.   Yes [provider]  acetaminophen (TYLENOL) 325 MG tablet Take 2 tablets (650 mg total) by mouth every 6 (six) hours as needed. 08/21/22   Cameron Ali, PA-C  blood glucose meter kit and supplies KIT Dispense based on patient and insurance preference. Use up to four times daily as directed. (FOR ICD-9 250.00, 250.01). 08/11/18   Katha Hamming, MD  Cholecalciferol (VITAMIN D3) 50 MCG (2000 UT) TABS Take 2,000 Units by mouth daily.    [provider]  HYDROcodone-acetaminophen (NORCO/VICODIN) 5-325 MG tablet Take 2 tablets by mouth every 6 (six) hours as needed for moderate pain (pain score 4-6). Patient not taking: Reported on 04/02/2023 12/31/22   [provider]  loratadine (CLARITIN) 10 MG tablet Take 10 mg by mouth daily.    [provider]  Multiple Vitamin (MULTIVITAMIN WITH MINERALS) TABS tablet Take 1 tablet by mouth daily. 07/31/18   Salary, Jetty Duhamel D, MD  Olopatadine HCl 0.2 % SOLN  09/20/21   [provider]  omeprazole (PRILOSEC) 40 MG capsule Take 1 capsule (40 mg total) by mouth daily. 01/01/23 04/01/23  Celso Amy, PA-C  RELION PEN NEEDLE 31G/8MM 31G X 8 MM MISC USE AS DIRECTED FOR INSULIN INJECTIONS    [provider]  sitaGLIPtin (JANUVIA) 100 MG tablet Take 100 mg by mouth daily.    [provider]  TRUEPLUS PEN NEEDLES 31G X 6 MM MISC  10/12/22   [provider]  venlafaxine XR (EFFEXOR-XR) 75 MG 24 hr capsule Take 225 mg by mouth daily with breakfast.    [provider]    Allergies as of 02/28/2023 - Review Complete 02/12/2023  Allergen Reaction Noted   Ace inhibitors Other (See Comments) and Cough 01/04/2015   Bee pollen Other (See Comments) 05/21/2016   Pollen extracts [pollen extract] Other (See Comments) 05/21/2016   Vicodin [hydrocodone-acetaminophen] Nausea And Vomiting 05/21/2016    Family History  Problem  Relation Age of Onset   Diabetes Mother    Breast cancer Mother 30   Cancer Sister    Cancer Father    Liver disease Brother    Breast cancer Maternal Aunt    Breast cancer Cousin        maternal    Social History   Socioeconomic History   Marital status: Widowed    Spouse name: Not on file   Number of children: Not on file   Years of education: Not on file   Highest education level: Not on file  Occupational History   Not on file  Tobacco Use   Smoking status: Never   Smokeless tobacco: Never  Vaping Use   Vaping status: Never Used  Substance and Sexual Activity   Alcohol use: No    Alcohol/week: 0.0 standard drinks of alcohol   Drug use: No   Sexual activity: Not Currently  Other Topics Concern   Not on file  Social History Narrative   Not on file   Social Drivers of  Health   Financial Resource Strain: Medium Risk (07/29/2018)   Overall Financial Resource Strain (CARDIA)    Difficulty of Paying Living Expenses: Somewhat hard  Food Insecurity: Food Insecurity Present (06/05/2019)   Received from [facility]   Hunger Vital Sign    Worried About Running Out of Food in the Last Year: Often true    Ran Out of Food in the Last Year: Often true  Transportation Needs: Unknown (07/29/2018)   PRAPARE - Administrator, Civil Service (Medical): Patient declined    Lack of Transportation (Non-Medical): Patient declined  Physical Activity: Unknown (07/29/2018)   Exercise Vital Sign    Days of Exercise per Week: Patient declined    Minutes of Exercise per Session: Patient declined  Stress: No Stress Concern Present (07/29/2018)   [employer] of Occupational Health - Occupational Stress Questionnaire    Feeling of Stress : Only a little  Social Connections: Unknown (07/29/2018)   Social Connection and Isolation Panel [NHANES]    Frequency of Communication with Friends and Family: Patient declined    Frequency of Social Gatherings with  Friends and Family: Patient declined    Attends Religious Services: Patient declined    Database administrator or Organizations: Patient declined    Attends Banker Meetings: Patient declined    Marital Status: Patient declined  Intimate Partner Violence: Unknown (07/29/2018)   Humiliation, Afraid, Rape, and Kick questionnaire    Fear of Current or Ex-Partner: Patient declined    Emotionally Abused: Patient declined    Physically Abused: Patient declined    Sexually Abused: Patient declined    Review of Systems: See HPI, otherwise negative ROS  Physical Exam: BP 138/85   Pulse 76   Temp (!) 96 F (35.6 C) (Temporal)   Resp 16   Wt 76.5 kg   SpO2 96%   BMI 31.86 kg/m  General:   Alert,  pleasant and cooperative in NAD Head:  Normocephalic and atraumatic. Neck:  Supple; no masses or thyromegaly. Lungs:  Clear throughout to auscultation, normal respiratory effort.    Heart:  +S1, +S2, Regular rate and rhythm, No edema. Abdomen:  Soft, nontender and nondistended. Normal bowel sounds, without guarding, and without rebound.   Neurologic:  Alert and  oriented x4;  grossly normal neurologically.  Impression/Plan: BRANDALYN HARTING is here for an colonoscopy to be performed for anemia.  Risks, benefits, limitations, and alternatives regarding  colonoscopy have been reviewed with the patient.  Questions have been answered.  All parties agreeable.   Wyline Mood, MD  04/02/2023, 9:14 AM

## 2023-04-02 NOTE — Anesthesia Preprocedure Evaluation (Signed)
Anesthesia Evaluation  Patient identified by MRN, date of birth, ID band Patient awake    Reviewed: Allergy & Precautions, NPO status , Patient's Chart, lab work & pertinent test results  History of Anesthesia Complications Negative for: history of anesthetic complications  Airway Mallampati: III  TM Distance: <3 FB Neck ROM: full    Dental  (+) Upper Dentures, Lower Dentures   Pulmonary asthma , neg sleep apnea, COPD,  COPD inhaler, Patient abstained from smoking.Not current smoker   Pulmonary exam normal breath sounds clear to auscultation       Cardiovascular Exercise Tolerance: Good METShypertension, Pt. on medications (-) angina (-) CAD and (-) Past MI Normal cardiovascular exam(-) dysrhythmias  Rhythm:Regular Rate:Normal - Systolic murmurs    Neuro/Psych  Headaches PSYCHIATRIC DISORDERS  Depression     Neuromuscular disease    GI/Hepatic Neg liver ROS,GERD  Controlled,,  Endo/Other  diabetes    Renal/GU Renal disease  negative genitourinary   Musculoskeletal   Abdominal   Peds  Hematology negative hematology ROS (+)   Anesthesia Other Findings Past Medical History: No date: Asthma No date: Atypical chest pain     Comment:  neg ETT at [facility] 04/05/2017: Body mass index (BMI) 31.0-31.9, adult No date: CKD (chronic kidney disease), stage III (HCC) 07/24/2013: Cocaine abuse, uncomplicated (HCC) No date: Depression No date: Diabetes mellitus without complication (HCC) No date: Fibromyalgia No date: HLD (hyperlipidemia) No date: Hypertension 06/21/2014: Mild cognitive impairment 10/24/2021: Stable angina pectoris (HCC) No date: Vitamin D deficiency  Past Surgical History: No date: ABDOMINAL HYSTERECTOMY 05/22/2016: COLONOSCOPY WITH PROPOFOL; N/A     Comment:  Procedure: COLONOSCOPY WITH PROPOFOL;  Surgeon: Christena Deem, MD;  Location: [facility] ENDOSCOPY;  Service:               Endoscopy;   Laterality: N/A; 05/22/2016: ESOPHAGOGASTRODUODENOSCOPY (EGD) WITH PROPOFOL; N/A     Comment:  Procedure: ESOPHAGOGASTRODUODENOSCOPY (EGD) WITH               PROPOFOL;  Surgeon: Christena Deem, MD;  Location:               [facility] ENDOSCOPY;  Service: Endoscopy;  Laterality: N/A; 2014: HAND SURGERY; Right     Comment:  Dr Rosita Kea 03/2013: rotator cuff surgery; Left     Comment:  DR Rosita Kea 04/25/2017: SHOULDER ARTHROSCOPY WITH OPEN ROTATOR CUFF REPAIR; Right     Comment:  Procedure: SHOULDER ARTHROSCOPY WITH OPEN ROTATOR CUFF               REPAIR;  Surgeon: Christena Flake, MD;  Location: [facility] ORS;              Service: Orthopedics;  Laterality: Right; 03/11/2018: SHOULDER ARTHROSCOPY WITH OPEN ROTATOR CUFF REPAIR; Right     Comment:  Procedure: SHOULDER ARTHROSCOPY WITH Recurrent  OPEN               ROTATOR CUFF REPAIR;  Surgeon: Christena Flake, MD;                Location: [facility] ORS;  Service: Orthopedics;  Laterality:               Right;  debridment and decompression     Reproductive/Obstetrics negative OB ROS  Anesthesia Physical Anesthesia Plan  ASA: 3  Anesthesia Plan: General   Post-op Pain Management: Minimal or no pain anticipated   Induction: Intravenous  PONV Risk Score and Plan: 3 and Propofol infusion and TIVA  Airway Management Planned: Natural Airway and Nasal Cannula  Additional Equipment: None  Intra-op Plan:   Post-operative Plan:   Informed Consent: I have reviewed the patients History and Physical, chart, labs and discussed the procedure including the risks, benefits and alternatives for the proposed anesthesia with the patient or authorized representative who has indicated his/her understanding and acceptance.     Dental Advisory Given  Plan Discussed with: Anesthesiologist, CRNA and Surgeon  Anesthesia Plan Comments: (Patient consented for risks of anesthesia including but not limited to:  - adverse  reactions to medications - risk of airway placement if required - damage to eyes, teeth, lips or other oral mucosa - nerve damage due to positioning  - sore throat or hoarseness - Damage to heart, brain, nerves, lungs, other parts of body or loss of life  Patient voiced understanding and assent.)       Anesthesia Quick Evaluation

## 2023-04-02 NOTE — Transfer of Care (Signed)
Immediate Anesthesia Transfer of Care Note  Patient: Theresa Booker  Procedure(s) Performed: COLONOSCOPY WITH PROPOFOL POLYPECTOMY HEMOSTASIS CLIP PLACEMENT  Patient Location: PACU  Anesthesia Type:General  Level of Consciousness: sedated  Airway & Oxygen Therapy: Patient Spontanous Breathing  Post-op Assessment: Report given to RN and Post -op Vital signs reviewed and stable  Post vital signs: Reviewed and stable  Last Vitals:  Vitals Value Taken Time  BP 115/76 04/02/23 1016  Temp 36 C 04/02/23 1016  Pulse 68 04/02/23 1016  Resp 16 04/02/23 1016  SpO2 97 % 04/02/23 1016    Last Pain:  Vitals:   04/02/23 1016  TempSrc: Temporal  PainSc: Asleep         Complications: No notable events documented.

## 2023-04-02 NOTE — Anesthesia Postprocedure Evaluation (Signed)
Anesthesia Post Note  Patient: JOLA CRITZER  Procedure(s) Performed: COLONOSCOPY WITH PROPOFOL POLYPECTOMY HEMOSTASIS CLIP PLACEMENT  Patient location during evaluation: Endoscopy Anesthesia Type: General Level of consciousness: awake and alert Pain management: pain level controlled Vital Signs Assessment: post-procedure vital signs reviewed and stable Respiratory status: spontaneous breathing, nonlabored ventilation, respiratory function stable and patient connected to nasal cannula oxygen Cardiovascular status: blood pressure returned to baseline and stable Postop Assessment: no apparent nausea or vomiting Anesthetic complications: no   No notable events documented.   Last Vitals:  Vitals:   04/02/23 1016 04/02/23 1028  BP: 115/76 (!) 110/90  Pulse: 68 60  Resp: 16 16  Temp: (!) 36 C   SpO2: 97% 99%    Last Pain:  Vitals:   04/02/23 1028  TempSrc:   PainSc: 0-No pain                 Corinda Gubler

## 2023-04-02 NOTE — Op Note (Signed)
[facility] Gastroenterology Patient Name: Theresa Booker Procedure Date: 04/02/2023 9:39 AM MRN: [id number] Account #: [id number] Date of Birth: Jul 19, 1945 Admit Type: Outpatient Age: 78 Room: [facility] ENDO ROOM 2 Gender: Female Note Status: Finalized Instrument Name: Peds Colonoscope [id number] Procedure:             Colonoscopy Indications:           Iron deficiency anemia Providers:             Wyline Mood MD, MD Referring MD:          No Local Md, MD (Referring MD) Medicines:             Monitored Anesthesia Care Complications:         No immediate complications. Procedure:             Pre-Anesthesia Assessment:                        - Prior to the procedure, a History and Physical was                         performed, and patient medications, allergies and                         sensitivities were reviewed. The patient's tolerance                         of previous anesthesia was reviewed.                        - The risks and benefits of the procedure and the                         sedation options and risks were discussed with the                         patient. All questions were answered and informed                         consent was obtained.                        - ASA Grade Assessment: II - A patient with mild                         systemic disease.                        After obtaining informed consent, the colonoscope was                         passed under direct vision. Throughout the procedure,                         the patient's blood pressure, pulse, and oxygen                         saturations were monitored continuously. The                         Colonoscope was introduced  through the anus and                         advanced to the the cecum, identified by the                         appendiceal orifice. The colonoscopy was performed                         with ease. The patient tolerated the procedure well.                          The quality of the bowel preparation was adequate. The                         ileocecal valve, appendiceal orifice, and rectum were                         photographed. Findings:      The perianal and digital rectal examinations were normal.      Multiple medium-mouthed diverticula were found in the sigmoid colon.      An 8 mm polyp was found in the ascending colon. The polyp was sessile.       The polyp was removed with a cold snare. Resection and retrieval were       complete. To prevent bleeding post-intervention, two hemostatic clips       were successfully placed. Clip manufacturer: [employer]. There       was no bleeding during, or at the end, of the procedure.      The exam was otherwise without abnormality on direct and retroflexion       views. Impression:            - Diverticulosis in the sigmoid colon.                        - One 8 mm polyp in the ascending colon, removed with                         a cold snare. Resected and retrieved. Clips were                         placed. Clip manufacturer: [employer].                        - The examination was otherwise normal on direct and                         retroflexion views. Recommendation:        - Discharge patient to home (with escort).                        - Resume previous diet.                        - Continue present medications.                        - Await pathology results.                        -  Repeat colonoscopy is not recommended due to current                         age (35 years or older) for surveillance. Procedure Code(s):     --- Professional ---                        [phone], Colonoscopy, flexible; with removal of                         tumor(s), polyp(s), or other lesion(s) by snare                         technique Diagnosis Code(s):     --- Professional ---                        D12.2, Benign neoplasm of ascending colon                        D50.9, Iron deficiency  anemia, unspecified                        K57.30, Diverticulosis of large intestine without                         perforation or abscess without bleeding CPT copyright 2022 American Medical Association. All rights reserved. The codes documented in this report are preliminary and upon coder review may  be revised to meet current compliance requirements. Wyline Mood, MD Wyline Mood MD, MD 04/02/2023 10:14:57 AM This report has been signed electronically. Number of Addenda: 0 Note Initiated On: 04/02/2023 9:39 AM Scope Withdrawal Time: 0 hours 12 minutes 20 seconds  Total Procedure Duration: 0 hours 15 minutes 6 seconds  Estimated Blood Loss:  Estimated blood loss: none.      [facility]

## 2023-04-03 ENCOUNTER — Encounter: Payer: Self-pay | Admitting: Gastroenterology

## 2023-04-04 LAB — SURGICAL PATHOLOGY

## 2023-04-08 ENCOUNTER — Encounter: Payer: Self-pay | Admitting: Gastroenterology

## 2023-05-01 ENCOUNTER — Other Ambulatory Visit: Payer: Self-pay | Admitting: Podiatry

## 2023-05-01 DIAGNOSIS — M25571 Pain in right ankle and joints of right foot: Secondary | ICD-10-CM

## 2023-05-15 ENCOUNTER — Ambulatory Visit
Admission: RE | Admit: 2023-05-15 | Discharge: 2023-05-15 | Disposition: A | Discharge status: Home / Self Care | Source: Ambulatory Visit | Attending: Podiatry | Admitting: Podiatry

## 2023-05-15 DIAGNOSIS — M25571 Pain in right ankle and joints of right foot: Secondary | ICD-10-CM

## 2023-09-27 ENCOUNTER — Inpatient Hospital Stay
Admission: EM | Admit: 2023-09-27 | Discharge: 2023-09-29 | DRG: 690 | Disposition: A | Discharge status: Home Health | Attending: Osteopathic Medicine | Admitting: Osteopathic Medicine

## 2023-09-27 ENCOUNTER — Other Ambulatory Visit: Payer: Self-pay

## 2023-09-27 ENCOUNTER — Emergency Department

## 2023-09-27 ENCOUNTER — Ambulatory Visit
Admission: EM | Admit: 2023-09-27 | Discharge: 2023-09-27 | Disposition: A | Discharge status: Home / Self Care | Attending: Emergency Medicine | Admitting: Emergency Medicine

## 2023-09-27 DIAGNOSIS — Z91048 Other nonmedicinal substance allergy status: Secondary | ICD-10-CM

## 2023-09-27 DIAGNOSIS — E1142 Type 2 diabetes mellitus with diabetic polyneuropathy: Secondary | ICD-10-CM | POA: Diagnosis not present

## 2023-09-27 DIAGNOSIS — N390 Urinary tract infection, site not specified: Secondary | ICD-10-CM

## 2023-09-27 DIAGNOSIS — Z860100 Personal history of colon polyps, unspecified: Secondary | ICD-10-CM

## 2023-09-27 DIAGNOSIS — Z6832 Body mass index (BMI) 32.0-32.9, adult: Secondary | ICD-10-CM

## 2023-09-27 DIAGNOSIS — Z833 Family history of diabetes mellitus: Secondary | ICD-10-CM

## 2023-09-27 DIAGNOSIS — F32A Depression, unspecified: Secondary | ICD-10-CM | POA: Diagnosis present

## 2023-09-27 DIAGNOSIS — N3000 Acute cystitis without hematuria: Principal | ICD-10-CM | POA: Diagnosis present

## 2023-09-27 DIAGNOSIS — Z809 Family history of malignant neoplasm, unspecified: Secondary | ICD-10-CM

## 2023-09-27 DIAGNOSIS — R55 Syncope and collapse: Principal | ICD-10-CM | POA: Diagnosis present

## 2023-09-27 DIAGNOSIS — E785 Hyperlipidemia, unspecified: Secondary | ICD-10-CM | POA: Diagnosis present

## 2023-09-27 DIAGNOSIS — J449 Chronic obstructive pulmonary disease, unspecified: Secondary | ICD-10-CM | POA: Diagnosis present

## 2023-09-27 DIAGNOSIS — G894 Chronic pain syndrome: Secondary | ICD-10-CM | POA: Diagnosis present

## 2023-09-27 DIAGNOSIS — I959 Hypotension, unspecified: Secondary | ICD-10-CM

## 2023-09-27 DIAGNOSIS — E66811 Obesity, class 1: Secondary | ICD-10-CM | POA: Diagnosis present

## 2023-09-27 DIAGNOSIS — K219 Gastro-esophageal reflux disease without esophagitis: Secondary | ICD-10-CM | POA: Diagnosis present

## 2023-09-27 DIAGNOSIS — Z794 Long term (current) use of insulin: Secondary | ICD-10-CM

## 2023-09-27 DIAGNOSIS — M797 Fibromyalgia: Secondary | ICD-10-CM | POA: Diagnosis present

## 2023-09-27 DIAGNOSIS — Z7951 Long term (current) use of inhaled steroids: Secondary | ICD-10-CM

## 2023-09-27 DIAGNOSIS — Z888 Allergy status to other drugs, medicaments and biological substances status: Secondary | ICD-10-CM

## 2023-09-27 DIAGNOSIS — B9620 Unspecified Escherichia coli [E. coli] as the cause of diseases classified elsewhere: Secondary | ICD-10-CM | POA: Diagnosis present

## 2023-09-27 DIAGNOSIS — Z885 Allergy status to narcotic agent status: Secondary | ICD-10-CM

## 2023-09-27 DIAGNOSIS — I129 Hypertensive chronic kidney disease with stage 1 through stage 4 chronic kidney disease, or unspecified chronic kidney disease: Secondary | ICD-10-CM | POA: Diagnosis present

## 2023-09-27 DIAGNOSIS — Z7984 Long term (current) use of oral hypoglycemic drugs: Secondary | ICD-10-CM

## 2023-09-27 DIAGNOSIS — N184 Chronic kidney disease, stage 4 (severe): Secondary | ICD-10-CM | POA: Diagnosis present

## 2023-09-27 DIAGNOSIS — J4489 Other specified chronic obstructive pulmonary disease: Secondary | ICD-10-CM | POA: Diagnosis present

## 2023-09-27 DIAGNOSIS — Z803 Family history of malignant neoplasm of breast: Secondary | ICD-10-CM

## 2023-09-27 DIAGNOSIS — Z79899 Other long term (current) drug therapy: Secondary | ICD-10-CM

## 2023-09-27 DIAGNOSIS — Z7982 Long term (current) use of aspirin: Secondary | ICD-10-CM

## 2023-09-27 DIAGNOSIS — N179 Acute kidney failure, unspecified: Secondary | ICD-10-CM | POA: Diagnosis present

## 2023-09-27 DIAGNOSIS — E1122 Type 2 diabetes mellitus with diabetic chronic kidney disease: Secondary | ICD-10-CM | POA: Diagnosis present

## 2023-09-27 LAB — COMPREHENSIVE METABOLIC PANEL WITH GFR
ALT: 13 U/L (ref 0–44)
AST: 17 U/L (ref 15–41)
Albumin: 3.2 g/dL — ABNORMAL LOW (ref 3.5–5.0)
Alkaline Phosphatase: 78 U/L (ref 38–126)
Anion gap: 11 (ref 5–15)
BUN: 29 mg/dL — ABNORMAL HIGH (ref 8–23)
CO2: 26 mmol/L (ref 22–32)
Calcium: 9 mg/dL (ref 8.9–10.3)
Chloride: 103 mmol/L (ref 98–111)
Creatinine, Ser: 2.47 mg/dL — ABNORMAL HIGH (ref 0.44–1.00)
GFR, Estimated: 19 mL/min — ABNORMAL LOW (ref >60)
Glucose, Bld: 118 mg/dL — ABNORMAL HIGH (ref 70–99)
Potassium: 4.5 mmol/L (ref 3.5–5.1)
Sodium: 140 mmol/L (ref 135–145)
Total Bilirubin: 0.6 mg/dL (ref 0.0–1.2)
Total Protein: 8 g/dL (ref 6.5–8.1)

## 2023-09-27 LAB — URINALYSIS, ROUTINE W REFLEX MICROSCOPIC
Bilirubin Urine: NEGATIVE
Glucose, UA: >=500 mg/dL — AB
Hgb urine dipstick: NEGATIVE
Ketones, ur: NEGATIVE mg/dL
Nitrite: NEGATIVE
Protein, ur: >=300 mg/dL — AB
Specific Gravity, Urine: 1.021 (ref 1.005–1.030)
WBC, UA: >50 WBC/hpf (ref 0–5)
pH: 5 (ref 5.0–8.0)

## 2023-09-27 LAB — GLUCOSE, CAPILLARY: Glucose-Capillary: 156 mg/dL — ABNORMAL HIGH (ref 70–99)

## 2023-09-27 LAB — CBC
HCT: 40.3 % (ref 36.0–46.0)
Hemoglobin: 12.7 g/dL (ref 12.0–15.0)
MCH: 27.3 pg (ref 26.0–34.0)
MCHC: 31.5 g/dL (ref 30.0–36.0)
MCV: 86.5 fL (ref 80.0–100.0)
Platelets: 330 K/uL (ref 150–400)
RBC: 4.66 MIL/uL (ref 3.87–5.11)
RDW: 14.6 % (ref 11.5–15.5)
WBC: 10.8 K/uL — ABNORMAL HIGH (ref 4.0–10.5)
nRBC: 0 % (ref 0.0–0.2)

## 2023-09-27 LAB — TROPONIN I (HIGH SENSITIVITY): Troponin I (High Sensitivity): 13 ng/L (ref <18)

## 2023-09-27 MED ORDER — ALBUTEROL SULFATE (2.5 MG/3ML) 0.083% IN NEBU: 2.5000 mg | INHALATION_SOLUTION | Freq: Four times a day (QID) | RESPIRATORY_TRACT | Status: DC | PRN

## 2023-09-27 MED ORDER — FERROUS SULFATE 325 (65 FE) MG PO TABS
325.0000 mg | ORAL_TABLET | Freq: Every day | ORAL | Status: DC
  Administered 2023-09-28 – 2023-09-29 (×2): 325 mg via ORAL
  Filled 2023-09-27 (×2): qty 1

## 2023-09-27 MED ORDER — ONDANSETRON HCL 4 MG PO TABS: 4.0000 mg | ORAL_TABLET | Freq: Four times a day (QID) | ORAL | Status: DC | PRN

## 2023-09-27 MED ORDER — FUROSEMIDE 40 MG PO TABS: 40.0000 mg | ORAL_TABLET | ORAL | Status: DC

## 2023-09-27 MED ORDER — INSULIN GLARGINE-YFGN 100 UNIT/ML ~~LOC~~ SOLN
20.0000 [IU] | Freq: Every day | SUBCUTANEOUS | Status: DC
  Administered 2023-09-28: 20 [IU] via SUBCUTANEOUS
  Filled 2023-09-27 (×2): qty 0.2

## 2023-09-27 MED ORDER — GABAPENTIN 300 MG PO CAPS
300.0000 mg | ORAL_CAPSULE | Freq: Two times a day (BID) | ORAL | Status: DC
  Administered 2023-09-28 – 2023-09-29 (×3): 300 mg via ORAL
  Filled 2023-09-27 (×3): qty 1

## 2023-09-27 MED ORDER — VENLAFAXINE HCL ER 75 MG PO CP24
225.0000 mg | ORAL_CAPSULE | Freq: Every day | ORAL | Status: DC
  Administered 2023-09-28 – 2023-09-29 (×2): 225 mg via ORAL
  Filled 2023-09-27 (×2): qty 1

## 2023-09-27 MED ORDER — SODIUM CHLORIDE 0.9 % IV SOLN: INTRAVENOUS | Status: AC

## 2023-09-27 MED ORDER — ALBUTEROL SULFATE HFA 108 (90 BASE) MCG/ACT IN AERS: 2.0000 | INHALATION_SPRAY | Freq: Four times a day (QID) | RESPIRATORY_TRACT | Status: DC | PRN

## 2023-09-27 MED ORDER — SODIUM CHLORIDE 0.9 % IV SOLN
1.0000 g | Freq: Once | INTRAVENOUS | Status: AC
  Administered 2023-09-27: 1 g via INTRAVENOUS
  Filled 2023-09-27: qty 10

## 2023-09-27 MED ORDER — TRAZODONE HCL 50 MG PO TABS
25.0000 mg | ORAL_TABLET | Freq: Every evening | ORAL | Status: DC | PRN
  Administered 2023-09-28: 25 mg via ORAL
  Filled 2023-09-27: qty 1

## 2023-09-27 MED ORDER — LINAGLIPTIN 5 MG PO TABS
5.0000 mg | ORAL_TABLET | Freq: Every day | ORAL | Status: DC
  Administered 2023-09-28 – 2023-09-29 (×2): 5 mg via ORAL
  Filled 2023-09-27 (×2): qty 1

## 2023-09-27 MED ORDER — SODIUM CHLORIDE 0.9% FLUSH
3.0000 mL | Freq: Two times a day (BID) | INTRAVENOUS | Status: DC
  Administered 2023-09-28 – 2023-09-29 (×3): 3 mL via INTRAVENOUS

## 2023-09-27 MED ORDER — OLOPATADINE HCL 0.1 % OP SOLN: 1.0000 [drp] | Freq: Two times a day (BID) | OPHTHALMIC | Status: DC | PRN

## 2023-09-27 MED ORDER — ACETAMINOPHEN 325 MG PO TABS
650.0000 mg | ORAL_TABLET | Freq: Four times a day (QID) | ORAL | Status: DC | PRN
  Administered 2023-09-28: 650 mg via ORAL
  Filled 2023-09-27 (×2): qty 2

## 2023-09-27 MED ORDER — ASPIRIN 81 MG PO TBEC
81.0000 mg | DELAYED_RELEASE_TABLET | Freq: Every day | ORAL | Status: DC
Start: 2023-09-28 — End: 2023-09-29
  Administered 2023-09-28 – 2023-09-29 (×2): 81 mg via ORAL
  Filled 2023-09-27 (×2): qty 1

## 2023-09-27 MED ORDER — PANTOPRAZOLE SODIUM 40 MG PO TBEC
40.0000 mg | DELAYED_RELEASE_TABLET | Freq: Every day | ORAL | Status: DC
  Administered 2023-09-28 – 2023-09-29 (×2): 40 mg via ORAL
  Filled 2023-09-27 (×2): qty 1

## 2023-09-27 MED ORDER — LORATADINE 10 MG PO TABS
10.0000 mg | ORAL_TABLET | Freq: Every day | ORAL | Status: DC
  Administered 2023-09-28 – 2023-09-29 (×2): 10 mg via ORAL
  Filled 2023-09-27 (×2): qty 1

## 2023-09-27 MED ORDER — ADULT MULTIVITAMIN W/MINERALS CH
1.0000 | ORAL_TABLET | Freq: Every day | ORAL | Status: DC
  Administered 2023-09-28 – 2023-09-29 (×2): 1 via ORAL
  Filled 2023-09-27 (×2): qty 1

## 2023-09-27 MED ORDER — EZETIMIBE 10 MG PO TABS
10.0000 mg | ORAL_TABLET | Freq: Every day | ORAL | Status: DC
  Administered 2023-09-28 – 2023-09-29 (×2): 10 mg via ORAL
  Filled 2023-09-27 (×2): qty 1

## 2023-09-27 MED ORDER — ISOSORBIDE MONONITRATE ER 60 MG PO TB24
30.0000 mg | ORAL_TABLET | Freq: Every day | ORAL | Status: DC
  Administered 2023-09-28 – 2023-09-29 (×2): 30 mg via ORAL
  Filled 2023-09-27 (×2): qty 1

## 2023-09-27 MED ORDER — VITAMIN D 25 MCG (1000 UNIT) PO TABS
2000.0000 [IU] | ORAL_TABLET | Freq: Every day | ORAL | Status: DC
  Administered 2023-09-28 – 2023-09-29 (×2): 2000 [IU] via ORAL
  Filled 2023-09-27 (×2): qty 2

## 2023-09-27 MED ORDER — MAGNESIUM HYDROXIDE 400 MG/5ML PO SUSP: 30.0000 mL | Freq: Every day | ORAL | Status: DC | PRN

## 2023-09-27 MED ORDER — ACETAMINOPHEN 650 MG RE SUPP: 650.0000 mg | Freq: Four times a day (QID) | RECTAL | Status: DC | PRN

## 2023-09-27 MED ORDER — TIOTROPIUM BROMIDE MONOHYDRATE 18 MCG IN CAPS: 18.0000 ug | ORAL_CAPSULE | Freq: Every day | RESPIRATORY_TRACT | Status: DC

## 2023-09-27 MED ORDER — ENOXAPARIN SODIUM 30 MG/0.3ML IJ SOSY
30.0000 mg | PREFILLED_SYRINGE | INTRAMUSCULAR | Status: DC
  Administered 2023-09-28 – 2023-09-29 (×2): 30 mg via SUBCUTANEOUS
  Filled 2023-09-27 (×2): qty 0.3

## 2023-09-27 MED ORDER — ONDANSETRON HCL 4 MG/2ML IJ SOLN: 4.0000 mg | Freq: Four times a day (QID) | INTRAMUSCULAR | Status: DC | PRN

## 2023-09-27 NOTE — Discharge Instructions (Signed)
 We are transferring you to the emergency department due to the fact that you have low blood pressure and you have had several episodes of passing out.

## 2023-09-27 NOTE — ED Triage Notes (Signed)
 First nurse note: Pt to ED via [facility] from [facility]. Pt reports weakness. Pt had witnessed syncopal episode this am. No injuries per EMS.   UC 95/64 CBG 156   EMS 115/89 HR 80 97% RA

## 2023-09-27 NOTE — ED Triage Notes (Addendum)
 Patient states that she's been having nose bleeds and fainting for the past 5 days. Patient states that this has happen about 3 times. Patient states that she checked her bs after passing out and the lowest was 90. Patient states that the last time she passed out was this morning, she was on the toilet. Unsure of how long she was out.

## 2023-09-27 NOTE — ED Provider Notes (Signed)
 [facility]    CSN: [id number] Arrival date & time: 09/27/23  1532      History   Chief Complaint Chief Complaint  Patient presents with   Loss of Consciousness   Epistaxis    HPI Theresa Booker is a 78 y.o. female.   HPI  78 year old female with past medical history significant for chronic kidney disease stage III, GERD, essential hypertension, chronic pain, fibromyalgia, asthma, mild cognitive impairment, cocaine abuse, and vitamin D  deficiency presents for evaluation of syncope.  She reports that she has been experiencing nosebleeds and fainting for the past 5 days.  This has happened on 3 separate occasions with the last one being this morning.  She reports that she was on the toilet and passed out and is unsure how long she was out.  She reports that she has been also experiencing dizziness and shortness of breath.  She also reports that her hands and feet will go numb.  She did not have any nausea, vomiting, or diarrhea.  Past Medical History:  Diagnosis Date   Asthma    Atypical chest pain    neg ETT at [facility]   Body mass index (BMI) 31.0-31.9, adult 04/05/2017   CKD (chronic kidney disease), stage III (HCC)    Closed right ankle fracture    Cocaine abuse, uncomplicated (HCC) 07/24/2013   Depression    Diabetes mellitus without complication (HCC)    Fibromyalgia    HLD (hyperlipidemia)    Hypertension    Mild cognitive impairment 06/21/2014   Stable angina pectoris (HCC) 10/24/2021   Vitamin D  deficiency     Patient Active Problem List   Diagnosis Date Noted   Adenomatous polyp of colon 04/02/2023   Acute metabolic encephalopathy 12/28/2022   Hypotension 12/28/2022   Anemia 12/28/2022   History of cocaine use 12/28/2022   Hematoma of abdominal wall, initial encounter 12/28/2022   Chronic obstructive pulmonary disease (COPD) (HCC) 12/28/2022   Metabolic encephalopathy 08/04/2021   Type 2 diabetes mellitus with chronic kidney disease, with long-term  current use of insulin  (HCC) 08/04/2021   Depression 08/04/2021   Left lower lobe pneumonia 02/15/2021   Acute renal failure superimposed on stage 3b chronic kidney disease (HCC) 02/15/2021   Uncontrolled type 2 diabetes mellitus with hyperglycemia, with long-term current use of insulin  (HCC) 02/15/2021   Diabetes (HCC) 06/09/2020   SDH (subdural hematoma) (HCC) 06/05/2019   Sepsis (HCC) 08/07/2018   AMS (altered mental status) 07/30/2018   Altered mental status 07/29/2018   Rotator cuff tendinitis, right 04/01/2017   Traumatic complete tear of right rotator cuff 04/01/2017   Dysphagia, unspecified 09/28/2015   Cocaine use 05/03/2015   Abnormal MRI, lumbar spine 05/03/2015   Numbness in feet 04/29/2015   Gait instability 04/29/2015   BP (high blood pressure) 04/13/2015   Fibromyalgia 04/13/2015   Essential (primary) hypertension 04/13/2015   Clinical depression 04/13/2015   Chronic kidney disease (CKD), stage III (moderate) (HCC) 04/13/2015   Airway hyperreactivity 04/13/2015   Chronic knee pain (Right) 04/13/2015   Chronic low back pain (Location of Secondary source of pain) (Bilateral) (R>L) 04/13/2015   Chronic lower straight pain (Location of Primary Source of Pain) (Right) 04/13/2015   Chronic lumbar radicular pain (Right) (L5 Dermatome) 04/13/2015   At high risk for falls 04/13/2015   Chronic hip pain (Right) 04/13/2015   Chronic sacroiliac joint pain (Bilateral) (R>L) 04/13/2015   Controlled type 2 diabetes mellitus without complication (HCC) 02/01/2015   Headache disorder 02/01/2015  Postural dizziness with presyncope 02/01/2015   Bad posture 02/01/2015   Episodic tension type headache 01/10/2015   Amnesia 10/18/2014   Mild cognitive impairment 06/21/2014   Combined fat and carbohydrate induced hyperlipemia 09/24/2013   Acid reflux 09/24/2013   Chronic pain 09/24/2013   Avitaminosis D 02/11/2013    Past Surgical History:  Procedure Laterality Date   ABDOMINAL  HYSTERECTOMY     BIOPSY  02/12/2023   Procedure: BIOPSY;  Surgeon: Therisa Bi, MD;  Location: [facility] ENDOSCOPY;  Service: Gastroenterology;;   COLONOSCOPY WITH PROPOFOL  N/A 05/22/2016   Procedure: COLONOSCOPY WITH PROPOFOL ;  Surgeon: Gladis RAYMOND Mariner, MD;  Location: [facility] ENDOSCOPY;  Service: Endoscopy;  Laterality: N/A;   COLONOSCOPY WITH PROPOFOL  N/A 02/12/2023   Procedure: COLONOSCOPY WITH PROPOFOL ;  Surgeon: Therisa Bi, MD;  Location: [facility] ENDOSCOPY;  Service: Gastroenterology;  Laterality: N/A;   COLONOSCOPY WITH PROPOFOL  N/A 04/02/2023   Procedure: COLONOSCOPY WITH PROPOFOL ;  Surgeon: Therisa Bi, MD;  Location: [facility] ENDOSCOPY;  Service: Gastroenterology;  Laterality: N/A;   ESOPHAGOGASTRODUODENOSCOPY (EGD) WITH PROPOFOL  N/A 05/22/2016   Procedure: ESOPHAGOGASTRODUODENOSCOPY (EGD) WITH PROPOFOL ;  Surgeon: Gladis RAYMOND Mariner, MD;  Location: [facility] ENDOSCOPY;  Service: Endoscopy;  Laterality: N/A;   ESOPHAGOGASTRODUODENOSCOPY (EGD) WITH PROPOFOL  N/A 02/12/2023   Procedure: ESOPHAGOGASTRODUODENOSCOPY (EGD) WITH PROPOFOL ;  Surgeon: Therisa Bi, MD;  Location: [facility] ENDOSCOPY;  Service: Gastroenterology;  Laterality: N/A;   HAND SURGERY Right 2014   Dr Kathlynn   HEMOSTASIS CLIP PLACEMENT  04/02/2023   Procedure: HEMOSTASIS CLIP PLACEMENT;  Surgeon: Therisa Bi, MD;  Location: [facility] ENDOSCOPY;  Service: Gastroenterology;;   POLYPECTOMY  04/02/2023   Procedure: POLYPECTOMY;  Surgeon: Therisa Bi, MD;  Location: [facility] ENDOSCOPY;  Service: Gastroenterology;;   rotator cuff surgery Left 03/2013   DR Kathlynn   SHOULDER ARTHROSCOPY WITH OPEN ROTATOR CUFF REPAIR Right 04/25/2017   Procedure: SHOULDER ARTHROSCOPY WITH OPEN ROTATOR CUFF REPAIR;  Surgeon: Edie Norleen PARAS, MD;  Location: [facility] ORS;  Service: Orthopedics;  Laterality: Right;   SHOULDER ARTHROSCOPY WITH OPEN ROTATOR CUFF REPAIR Right 03/11/2018   Procedure: SHOULDER ARTHROSCOPY WITH Recurrent  OPEN ROTATOR CUFF REPAIR;  Surgeon: Edie Norleen PARAS, MD;  Location:  [facility] ORS;  Service: Orthopedics;  Laterality: Right;  debridment and decompression    OB History   No obstetric history on file.      Home Medications    Prior to Admission medications   Medication Sig Start Date End Date Taking? Authorizing Provider  acetaminophen  (TYLENOL ) 325 MG tablet Take 2 tablets (650 mg total) by mouth every 6 (six) hours as needed. 08/21/22   Cleaster Tinnie LABOR, PA-C  albuterol  (VENTOLIN  HFA) 108 (90 Base) MCG/ACT inhaler Inhale 2 puffs into the lungs every 6 (six) hours as needed for wheezing or shortness of breath.     [provider]  aspirin  EC 81 MG tablet Take 81 mg by mouth daily. 03/02/20   [provider]  blood glucose meter kit and supplies KIT Dispense based on patient and insurance preference. Use up to four times daily as directed. (FOR ICD-9 250.00, 250.01). 08/11/18   Almeda Bernard, MD  Cholecalciferol  (VITAMIN D3) 50 MCG (2000 UT) TABS Take 2,000 Units by mouth daily.    [provider]  ezetimibe  (ZETIA ) 10 MG tablet Take 10 mg by mouth daily. 06/06/20   [provider]  FEROSUL 325 (65 Fe) MG tablet Take 325 mg by mouth daily with breakfast. 07/20/21   [provider]  fluticasone  furoate-vilanterol (BREO ELLIPTA ) 100-25 MCG/INH AEPB  Inhale 1 puff into the lungs daily. 01/22/19   [provider]  furosemide  (LASIX ) 40 MG tablet Take 1 tablet (40 mg total) by mouth 3 (three) times a week. 08/07/21   Tobie Yetta HERO, MD  gabapentin  (NEURONTIN ) 300 MG capsule Take by mouth 3 (three) times daily. 04/02/16   [provider]  HYDROcodone -acetaminophen  (NORCO/VICODIN) 5-325 MG tablet Take 2 tablets by mouth every 6 (six) hours as needed for moderate pain (pain score 4-6). Patient not taking: Reported on 04/02/2023 12/31/22   [provider]  insulin  glargine (LANTUS ) 100 UNIT/ML Solostar Pen Inject 20 Units into the skin daily. 08/05/21   Tobie Yetta HERO, MD  isosorbide  mononitrate  (IMDUR ) 30 MG 24 hr tablet Take 30 mg by mouth daily.    [provider]  loratadine  (CLARITIN ) 10 MG tablet Take 10 mg by mouth daily.    [provider]  Multiple Vitamin (MULTIVITAMIN WITH MINERALS) TABS tablet Take 1 tablet by mouth daily. 07/31/18   Salary, Nemiah BIRCH, MD  nortriptyline  (PAMELOR ) 50 MG capsule Take 50 mg by mouth at bedtime.    [provider]  Olopatadine HCl 0.2 % SOLN  09/20/21   [provider]  omeprazole  (PRILOSEC) 20 MG capsule Take 20 mg by mouth daily. 01/21/23   [provider]  omeprazole  (PRILOSEC) 40 MG capsule Take 1 capsule (40 mg total) by mouth daily. 01/01/23 04/01/23  [place], PA-C  RELION PEN NEEDLE 31G/8MM 31G X 8 MM MISC USE AS DIRECTED FOR INSULIN  INJECTIONS    [provider]  sitaGLIPtin (JANUVIA) 100 MG tablet Take 100 mg by mouth daily.    [provider]  tiotropium (SPIRIVA ) 18 MCG inhalation capsule Place 18 mcg into inhaler and inhale daily.    [provider]  TRUEPLUS PEN NEEDLES 31G X 6 MM MISC  10/12/22   [provider]  venlafaxine  XR (EFFEXOR -XR) 75 MG 24 hr capsule Take 225 mg by mouth daily with breakfast.    [provider]    Family History Family History  Problem Relation Age of Onset   Diabetes Mother    Breast cancer Mother 89   Cancer Sister    Cancer Father    Liver disease Brother    Breast cancer Maternal Aunt    Breast cancer Cousin        maternal    Social History Social History   Tobacco Use   Smoking status: Never   Smokeless tobacco: Never  Vaping Use   Vaping status: Never Used  Substance Use Topics   Alcohol use: No    Alcohol/week: 0.0 standard drinks of alcohol   Drug use: No     Allergies   Ace inhibitors, Bee pollen, Pollen extracts [pollen extract], and Vicodin [hydrocodone -acetaminophen ]   Review of Systems Review of Systems  Respiratory:  Positive for shortness of breath.   Cardiovascular:   Positive for chest pain.  Gastrointestinal:  Negative for diarrhea, nausea and vomiting.  Neurological:  Positive for syncope and numbness.       Numbness of hands and feet     Physical Exam Triage Vital Signs ED Triage Vitals  Encounter Vitals Group     BP 09/27/23 1550 95/64     Girls Systolic BP Percentile --      Girls Diastolic BP Percentile --      Boys Systolic BP Percentile --      Boys Diastolic BP Percentile --      Pulse  Rate 09/27/23 1550 86     Resp 09/27/23 1550 (!) 26     Temp 09/27/23 1550 98.9 F (37.2 C)     Temp Source 09/27/23 1550 Oral     SpO2 09/27/23 1550 94 %     Weight --      Height --      Head Circumference --      Peak Flow --      Pain Score 09/27/23 1548 0     Pain Loc --      Pain Education --      Exclude from Growth Chart --    No data found.  Updated Vital Signs BP 95/64 (BP Location: Right Arm)   Pulse 86   Temp 98.9 F (37.2 C) (Oral)   Resp (!) 26   SpO2 94%   Visual Acuity Right Eye Distance:   Left Eye Distance:   Bilateral Distance:    Right Eye Near:   Left Eye Near:    Bilateral Near:     Physical Exam Vitals and nursing note reviewed.  Constitutional:      Appearance: Normal appearance. She is not ill-appearing.  HENT:     Head: Normocephalic and atraumatic.  Eyes:     Extraocular Movements: Extraocular movements intact.     Pupils: Pupils are equal, round, and reactive to light.  Cardiovascular:     Rate and Rhythm: Normal rate and regular rhythm.     Pulses: Normal pulses.     Heart sounds: Normal heart sounds. No murmur heard.    No friction rub. No gallop.  Pulmonary:     Effort: Pulmonary effort is normal.     Breath sounds: Normal breath sounds. No wheezing, rhonchi or rales.  Skin:    General: Skin is warm and dry.     Capillary Refill: Capillary refill takes less than 2 seconds.     Findings: No rash.  Neurological:     General: No focal deficit present.     Mental Status: She is alert and  oriented to person, place, and time.      UC Treatments / Results  Labs (all labs ordered are listed, but only abnormal results are displayed) Labs Reviewed  GLUCOSE, CAPILLARY - Abnormal; Notable for the following components:      Result Value   Glucose-Capillary 156 (*)    All other components within normal limits    EKG Normal sinus rhythm with a ventricular rate of 84 bpm PR interval 122 ms QRS duration 112 ms QT/QTc 390/460 ms No ST or T wave abnormalities noted.  Radiology No results found.  Procedures Procedures (including critical care time)  Medications Ordered in UC Medications - No data to display  Initial Impression / Assessment and Plan / UC Course  I have reviewed the triage vital signs and the nursing notes.  Pertinent labs & imaging results that were available during my care of the patient were reviewed by me and considered in my medical decision making (see chart for details).   Patient is a nontoxic-appearing 70-year-old female presenting for evaluation of multiple episodes of syncope over the last 5 days.  The patient reports that she has checked her blood sugar after passing out each time and the lowest her blood sugar was at home was 90.  Fingerstick blood sugar was collected at triage and her sugar is currently 156.  Given that the patient has had multiple episodes of syncope, complaining of dizziness, chest pain, and  shortness of breath I feel she needs to be evaluated in the emergency department.  I will obtain an EKG and have the patient transferred to the ER via EMS.  Of concern is the patient's blood pressure which is hypotensive at 95/64.  Respiratory rate is also mildly elevated at 26 with a 94% room air oxygen saturation.  Heart rate is 86.  Patient is afebrile with an oral temp of 98.9.  Patient's EKG shows normal without any ST or T wave abnormalities.  It is reading inferior infarct of age-indeterminate.  When compared to EKG dated 12/27/2022  there is no appreciable change.  I have called and spoke with Jon, the charge nurse at [facility].  I will have the patient transferred to the emergency department [facility] for evaluation of her syncope.  Report given to [facility].  Care transferred.   Final Clinical Impressions(s) / UC Diagnoses   Final diagnoses:  Syncope, unspecified syncope type  Hypotension, unspecified hypotension type     Discharge Instructions      We are transferring you to the emergency department due to the fact that you have low blood pressure and you have had several episodes of passing out.     ED Prescriptions   None    PDMP not reviewed this encounter.   Bernardino Ditch, NP 09/27/23 1640

## 2023-09-27 NOTE — ED Provider Notes (Signed)
 [facility] Provider Note    Event Date/Time   First MD Initiated Contact with Patient 09/27/23 2009     (approximate)   History   Chief Complaint Loss of Consciousness   HPI  Theresa Booker is a 78 y.o. female with past medical history of hypertension, diabetes, COPD, CKD, and chronic pain syndrome who presents to the ED complaining of syncope.  Patient reports that she woke up this morning to go to the bathroom, was feeling lightheaded and dizzy on her way there.  The next thing that she remembers, she was waking up on the floor in front of the toilet.  She is unsure whether she hit her head, does report some soreness in the back of her head and neck throughout the day.  She continued to feel generally unwell with weakness and fatigue throughout the day, eventually decided to seek care in the ED.  She denies any chest pain or shortness of breath with the episode, has not had any fevers, cough, nausea, vomiting, or diarrhea.  She does endorse some dysuria but denies flank pain.     Physical Exam   Triage Vital Signs: ED Triage Vitals  Encounter Vitals Group     BP 09/27/23 1742 100/82     Girls Systolic BP Percentile --      Girls Diastolic BP Percentile --      Boys Systolic BP Percentile --      Boys Diastolic BP Percentile --      Pulse Rate 09/27/23 1742 86     Resp 09/27/23 1742 20     Temp 09/27/23 1742 98.5 F (36.9 C)     Temp Source 09/27/23 1742 Oral     SpO2 09/27/23 1742 99 %     Weight 09/27/23 1741 168 lb 10.4 oz (76.5 kg)     Height --      Head Circumference --      Peak Flow --      Pain Score 09/27/23 1741 0     Pain Loc --      Pain Education --      Exclude from Growth Chart --     Most recent vital signs: Vitals:   09/27/23 2020 09/27/23 2021  BP:  135/82  Pulse:  82  Resp:  18  Temp:    SpO2: 99% 99%    Constitutional: Alert and oriented. Eyes: Conjunctivae are normal. Head: Atraumatic. Nose: No  congestion/rhinnorhea. Mouth/Throat: Mucous membranes are moist.  Neck: Midline cervical spine tenderness to palpation noted. Cardiovascular: Normal rate, regular rhythm. Grossly normal heart sounds.  2+ radial pulses bilaterally. Respiratory: Normal respiratory effort.  No retractions. Lungs CTAB. Gastrointestinal: Soft and nontender. No distention. Musculoskeletal: No lower extremity tenderness nor edema.  Neurologic:  Normal speech and language. No gross focal neurologic deficits are appreciated.    ED Results / Procedures / Treatments   Labs (all labs ordered are listed, but only abnormal results are displayed) Labs Reviewed  COMPREHENSIVE METABOLIC PANEL WITH GFR - Abnormal; Notable for the following components:      Result Value   Glucose, Bld 118 (*)    BUN 29 (*)    Creatinine, Ser 2.47 (*)    Albumin 3.2 (*)    GFR, Estimated 19 (*)    All other components within normal limits  CBC - Abnormal; Notable for the following components:   WBC 10.8 (*)    All other components within normal limits  URINALYSIS,  ROUTINE W REFLEX MICROSCOPIC - Abnormal; Notable for the following components:   Color, Urine AMBER (*)    APPearance CLOUDY (*)    Glucose, UA >=500 (*)    Protein, ur >=300 (*)    Leukocytes,Ua LARGE (*)    Bacteria, UA FEW (*)    Non Squamous Epithelial PRESENT (*)    All other components within normal limits  URINE CULTURE  TROPONIN I (HIGH SENSITIVITY)     EKG  ED ECG REPORT I, Carlin Palin, the attending physician, personally viewed and interpreted this ECG.   Date: 09/27/2023  EKG Time: 17:53  Rate: 83  Rhythm: normal sinus rhythm vs atrial flutter  Axis: Normal  Intervals:none  ST&T Change: None  RADIOLOGY CT head reviewed and interpreted by me with no hemorrhage or midline shift.  PROCEDURES:  Critical Care performed: No  Procedures   MEDICATIONS ORDERED IN ED: Medications  cefTRIAXone  (ROCEPHIN ) 1 g in sodium chloride  0.9 % 100 mL  IVPB (has no administration in time range)     IMPRESSION / MDM / ASSESSMENT AND PLAN / ED COURSE  I reviewed the triage vital signs and the nursing notes.                              78 y.o. female with past medical history of hypertension, diabetes, COPD, CKD, and chronic pain syndrome who presents to the ED following syncopal episode this morning, has continued to feel unwell throughout the day today.  Patient's presentation is most consistent with acute presentation with potential threat to life or bodily function.  Differential diagnosis includes, but is not limited to, arrhythmia, ACS, orthostatic hypotension, vasovagal episode, UTI, anemia, electrolyte abnormality, AKI, head injury.  Patient nontoxic-appearing and in no acute distress, vital signs are unremarkable and do not appear concerning for sepsis.  CT head and cervical spine are negative for acute process, no evidence of traumatic injury from her fall.  EKG shows no evidence of arrhythmia or ischemia, troponin within normal limits and low suspicion for cardiac etiology for syncope.  Labs show renal function stable compared to previous without acute electrolyte abnormality, anemia, or leukocytosis.  Urinalysis does appear concerning for infection, likely contributing to patient's ongoing weakness.  She is having difficulty ambulating here in the ED and would benefit from admission for further treatment of UTI as well as syncope workup.  Case discussed with hospitalist for admission.      FINAL CLINICAL IMPRESSION(S) / ED DIAGNOSES   Final diagnoses:  Syncope, unspecified syncope type  Acute cystitis without hematuria     Rx / DC Orders   ED Discharge Orders     None        Note:  This document was prepared using Dragon voice recognition software and may include unintentional dictation errors.   Palin Carlin, MD 09/27/23 2225

## 2023-09-27 NOTE — ED Notes (Signed)
 Patient is being discharged from the Urgent Care and sent to the Emergency Department via EMS . Per Venetia Motto, NP, patient is in need of higher level of care due to Syncope, chest pain. Patient is aware and verbalizes understanding of plan of care.  Vitals:   09/27/23 1550  BP: 95/64  Pulse: 86  Resp: (!) 26  Temp: 98.9 F (37.2 C)  SpO2: 94%

## 2023-09-28 ENCOUNTER — Encounter: Payer: Self-pay | Admitting: Family Medicine

## 2023-09-28 DIAGNOSIS — M797 Fibromyalgia: Secondary | ICD-10-CM | POA: Diagnosis present

## 2023-09-28 DIAGNOSIS — G894 Chronic pain syndrome: Secondary | ICD-10-CM | POA: Diagnosis present

## 2023-09-28 DIAGNOSIS — K219 Gastro-esophageal reflux disease without esophagitis: Secondary | ICD-10-CM | POA: Insufficient documentation

## 2023-09-28 DIAGNOSIS — Z7984 Long term (current) use of oral hypoglycemic drugs: Secondary | ICD-10-CM | POA: Diagnosis not present

## 2023-09-28 DIAGNOSIS — Z7982 Long term (current) use of aspirin: Secondary | ICD-10-CM | POA: Diagnosis not present

## 2023-09-28 DIAGNOSIS — F32A Depression, unspecified: Secondary | ICD-10-CM | POA: Diagnosis present

## 2023-09-28 DIAGNOSIS — Z794 Long term (current) use of insulin: Secondary | ICD-10-CM | POA: Diagnosis not present

## 2023-09-28 DIAGNOSIS — Z885 Allergy status to narcotic agent status: Secondary | ICD-10-CM | POA: Diagnosis not present

## 2023-09-28 DIAGNOSIS — R55 Syncope and collapse: Secondary | ICD-10-CM | POA: Diagnosis present

## 2023-09-28 DIAGNOSIS — J4489 Other specified chronic obstructive pulmonary disease: Secondary | ICD-10-CM | POA: Diagnosis present

## 2023-09-28 DIAGNOSIS — E1122 Type 2 diabetes mellitus with diabetic chronic kidney disease: Secondary | ICD-10-CM | POA: Diagnosis present

## 2023-09-28 DIAGNOSIS — N179 Acute kidney failure, unspecified: Secondary | ICD-10-CM | POA: Diagnosis present

## 2023-09-28 DIAGNOSIS — N3000 Acute cystitis without hematuria: Secondary | ICD-10-CM | POA: Diagnosis present

## 2023-09-28 DIAGNOSIS — Z833 Family history of diabetes mellitus: Secondary | ICD-10-CM | POA: Diagnosis not present

## 2023-09-28 DIAGNOSIS — E1142 Type 2 diabetes mellitus with diabetic polyneuropathy: Secondary | ICD-10-CM | POA: Diagnosis present

## 2023-09-28 DIAGNOSIS — E66811 Obesity, class 1: Secondary | ICD-10-CM | POA: Diagnosis present

## 2023-09-28 DIAGNOSIS — Z7951 Long term (current) use of inhaled steroids: Secondary | ICD-10-CM | POA: Diagnosis not present

## 2023-09-28 DIAGNOSIS — N184 Chronic kidney disease, stage 4 (severe): Secondary | ICD-10-CM | POA: Diagnosis present

## 2023-09-28 DIAGNOSIS — Z79899 Other long term (current) drug therapy: Secondary | ICD-10-CM | POA: Diagnosis not present

## 2023-09-28 DIAGNOSIS — Z91048 Other nonmedicinal substance allergy status: Secondary | ICD-10-CM | POA: Diagnosis not present

## 2023-09-28 DIAGNOSIS — B9620 Unspecified Escherichia coli [E. coli] as the cause of diseases classified elsewhere: Secondary | ICD-10-CM | POA: Diagnosis present

## 2023-09-28 DIAGNOSIS — Z888 Allergy status to other drugs, medicaments and biological substances status: Secondary | ICD-10-CM | POA: Diagnosis not present

## 2023-09-28 DIAGNOSIS — E785 Hyperlipidemia, unspecified: Secondary | ICD-10-CM | POA: Diagnosis present

## 2023-09-28 DIAGNOSIS — N390 Urinary tract infection, site not specified: Secondary | ICD-10-CM

## 2023-09-28 DIAGNOSIS — I129 Hypertensive chronic kidney disease with stage 1 through stage 4 chronic kidney disease, or unspecified chronic kidney disease: Secondary | ICD-10-CM | POA: Diagnosis present

## 2023-09-28 LAB — CBC
HCT: 34.4 % — ABNORMAL LOW (ref 36.0–46.0)
Hemoglobin: 10.9 g/dL — ABNORMAL LOW (ref 12.0–15.0)
MCH: 27.3 pg (ref 26.0–34.0)
MCHC: 31.7 g/dL (ref 30.0–36.0)
MCV: 86.2 fL (ref 80.0–100.0)
Platelets: 303 K/uL (ref 150–400)
RBC: 3.99 MIL/uL (ref 3.87–5.11)
RDW: 14.6 % (ref 11.5–15.5)
WBC: 10.5 K/uL (ref 4.0–10.5)
nRBC: 0 % (ref 0.0–0.2)

## 2023-09-28 LAB — BASIC METABOLIC PANEL WITH GFR
Anion gap: 5 (ref 5–15)
BUN: 32 mg/dL — ABNORMAL HIGH (ref 8–23)
CO2: 25 mmol/L (ref 22–32)
Calcium: 8.4 mg/dL — ABNORMAL LOW (ref 8.9–10.3)
Chloride: 105 mmol/L (ref 98–111)
Creatinine, Ser: 2.62 mg/dL — ABNORMAL HIGH (ref 0.44–1.00)
GFR, Estimated: 18 mL/min — ABNORMAL LOW (ref >60)
Glucose, Bld: 225 mg/dL — ABNORMAL HIGH (ref 70–99)
Potassium: 4 mmol/L (ref 3.5–5.1)
Sodium: 135 mmol/L (ref 135–145)

## 2023-09-28 LAB — GLUCOSE, CAPILLARY: Glucose-Capillary: 159 mg/dL — ABNORMAL HIGH (ref 70–99)

## 2023-09-28 MED ORDER — SODIUM CHLORIDE 0.9 % IV SOLN
1.0000 g | INTRAVENOUS | Status: DC
  Administered 2023-09-28: 1 g via INTRAVENOUS
  Filled 2023-09-28: qty 10

## 2023-09-28 MED ORDER — UMECLIDINIUM BROMIDE 62.5 MCG/ACT IN AEPB
1.0000 | INHALATION_SPRAY | Freq: Every day | RESPIRATORY_TRACT | Status: DC
  Administered 2023-09-28 – 2023-09-29 (×2): 1 via RESPIRATORY_TRACT
  Filled 2023-09-28: qty 7

## 2023-09-28 MED ORDER — SODIUM CHLORIDE 0.9 % IV SOLN: INTRAVENOUS | Status: AC

## 2023-09-28 NOTE — Assessment & Plan Note (Signed)
-  The patient will be admitted to an observation medically monitored bed. - Will check orthostatics q 12 hours. - Will obtain a bilateral carotid Doppler and 2D echo. - The patient will be gently hydrated with IV normal saline and monitored for arrhythmias. -Differential diagnoses would include neurally mediated syncope, cardiogenic, arrhythmias related,  orthostatic hypotension and less likely hypoglycemia.

## 2023-09-28 NOTE — Plan of Care (Signed)
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
## 2023-09-28 NOTE — Assessment & Plan Note (Signed)
-   Will continue her inhalers.

## 2023-09-28 NOTE — Assessment & Plan Note (Signed)
 Will continue PPI therapy.

## 2023-09-28 NOTE — Progress Notes (Signed)
 PROGRESS NOTE    Theresa Booker   FMW:[id number] DOB: Aug 22, 1945  DOA: 09/27/2023 Date of Service: 09/28/23 which is hospital day 0  PCP: Adina Buel HERO, MD    Hospital course / significant events:   HPI: Theresa Booker is a 78 y.o. African-American female with medical history significant for asthma, stage IV chronic kidney disease, COPD, depression, type 2 diabetes mellitus, fibromyalgia, hypertension, dyslipidemia and vitamin D  deficiency, who presented to the emergency room with syncope. She stated that she woke up in the morning to go to the bathroom and was feeling lightheaded and dizzy on her way there. The next thing she remembers was waking up on the floor in front of her toilet.   07/25: to ED. UA was positive for UTI. Noncontrasted head CT scan revealed mild chronic small vessel ischemic changes of the white matter with no acute intracranial abnormality. C-spine CT showed straightening of the cervical spine with advanced degenerative changes  07/26: orthostatic hypotension, persistent AKI, remain on gentle IV fluids     Consultants:  none  Procedures/Surgeries: none      ASSESSMENT & PLAN:   Syncope and collapse Differential diagnoses would include orthostatic or vasovagal event in context of UTI, neurally mediated syncope, cardiogenic, arrhythmias related,  orthostatic hypotension and less likely hypoglycemia. Carotid doppler 12/2022 no significant stenosis will not repeat test  Echo 12/2023 preserved EF, G1 dias df will not repeat test Orthostatics --> positive  IV normal saline  Telemetry x24h    Acute lower UTI IV Rocephin . urine culture and sensitivity.   AKI on CKD stage 4 Following w/ nephrology outpatient  Cr 08/15/23 was 1.8, Cr on presentation to ED 09/27/23 was 2.62 IV fluids and encourage po intake  Monitor BMP  Type 2 diabetes mellitus with peripheral neuropathy  Basal + SSI continue Januvia. continue Neurontin .   GERD without  esophagitis PPI   Chronic obstructive pulmonary disease (COPD)  Continue bronchodilators. O2 walk test see if needing home oxygen    Depression continue Effexor  XR  hold off Pamelor .    Class 1 obesity based on BMI: Body mass index is 31.49 kg/m.SABRA Significantly low or high BMI is associated with higher medical risk.  Underweight - under 18  overweight - 25 to 29 obese - 30 or more Class 1 obesity: BMI of 30.0 to 34 Class 2 obesity: BMI of 35.0 to 39 Class 3 obesity: BMI of 40.0 to 49 Super Morbid Obesity: BMI 50-59 Super-super Morbid Obesity: BMI 60+ Healthy nutrition and physical activity advised as adjunct to other disease management and risk reduction treatments    DVT prophylaxis: lovenox  IV fluids: NS continuous IV fluids  Nutrition: cardiac/carb diet Central lines / other devices: none  Code Status: FULL CODE ACP documentation reviewed:  none on file in [facility]  TOC needs: TBD Medical barriers to dispo: AKI, fluids, await UCx. Expected medical readiness for discharge tomorrow.              Subjective / Brief ROS:  Patient reports no concerns at this time Denies CP/SOB.  Pain controlled.  Denies new weakness.  Tolerating diet.  Reports no concerns w/ urination/defecation.   Family Communication: none at this time     Objective Findings:  Vitals:   09/28/23 0518 09/28/23 0827 09/28/23 1226 09/28/23 1508  BP:  129/80 131/79 117/67  Pulse:  73 76 78  Resp:   18 18  Temp:  98.4 F (36.9 C) 98.2 F (36.8 C) 97.8 F (36.6  C)  TempSrc:  Oral    SpO2:  97% 95% 100%  Weight: 75.6 kg     Height:        Intake/Output Summary (Last 24 hours) at 09/28/2023 1545 Last data filed at 09/28/2023 1300 Gross per 24 hour  Intake 1103.56 ml  Output --  Net 1103.56 ml   Filed Weights   09/27/23 1741 09/27/23 2327 09/28/23 0518  Weight: 76.5 kg 73.7 kg 75.6 kg    Examination:  Physical Exam Constitutional:      General: She is not in acute  distress. Cardiovascular:     Rate and Rhythm: Normal rate and regular rhythm.  Pulmonary:     Effort: Pulmonary effort is normal.     Breath sounds: No wheezing, rhonchi or rales.  Skin:    General: Skin is warm and dry.  Neurological:     Mental Status: She is alert and oriented to person, place, and time. Mental status is at baseline.  Psychiatric:        Mood and Affect: Mood normal.        Behavior: Behavior normal.          Scheduled Medications:   aspirin  EC  81 mg Oral Daily   cholecalciferol   2,000 Units Oral Daily   enoxaparin  (LOVENOX ) injection  30 mg Subcutaneous Q24H   ezetimibe   10 mg Oral Daily   ferrous sulfate   325 mg Oral Q breakfast   [START ON 09/30/2023] furosemide   40 mg Oral Once per day on Monday Wednesday Friday   gabapentin   300 mg Oral BID   insulin  glargine-yfgn  20 Units Subcutaneous Daily   isosorbide  mononitrate  30 mg Oral Daily   linagliptin   5 mg Oral Daily   loratadine   10 mg Oral Daily   multivitamin with minerals  1 tablet Oral Daily   pantoprazole   40 mg Oral Daily   sodium chloride  flush  3 mL Intravenous Q12H   umeclidinium bromide   1 puff Inhalation Daily   venlafaxine  XR  225 mg Oral Q breakfast    Continuous Infusions:  sodium chloride      cefTRIAXone  (ROCEPHIN )  IV      PRN Medications:  acetaminophen  **OR** acetaminophen , albuterol , magnesium  hydroxide, olopatadine , ondansetron  **OR** ondansetron  (ZOFRAN ) IV, traZODone   Antimicrobials from admission:  Anti-infectives (From admission, onward)    Start     Dose/Rate Route Frequency Ordered Stop   09/28/23 2200  cefTRIAXone  (ROCEPHIN ) 1 g in sodium chloride  0.9 % 100 mL IVPB        1 g 200 mL/hr over 30 Minutes Intravenous Every 24 hours 09/28/23 1049     09/27/23 2200  cefTRIAXone  (ROCEPHIN ) 1 g in sodium chloride  0.9 % 100 mL IVPB        1 g 200 mL/hr over 30 Minutes Intravenous  Once 09/27/23 2147 09/27/23 2322           Data Reviewed:  I have personally  reviewed the following...  CBC: Recent Labs  Lab 09/27/23 1748 09/28/23 0311  WBC 10.8* 10.5  HGB 12.7 10.9*  HCT 40.3 34.4*  MCV 86.5 86.2  PLT 330 303   Basic Metabolic Panel: Recent Labs  Lab 09/27/23 1748 09/28/23 0311  NA 140 135  K 4.5 4.0  CL 103 105  CO2 26 25  GLUCOSE 118* 225*  BUN 29* 32*  CREATININE 2.47* 2.62*  CALCIUM  9.0 8.4*   GFR: Estimated Creatinine Clearance: 15.9 mL/min (A) (by C-G formula based on SCr  of 2.62 mg/dL (H)). Liver Function Tests: Recent Labs  Lab 09/27/23 1748  AST 17  ALT 13  ALKPHOS 78  BILITOT 0.6  PROT 8.0  ALBUMIN 3.2*   No results for input(s): LIPASE, AMYLASE in the last 168 hours. No results for input(s): AMMONIA in the last 168 hours. Coagulation Profile: No results for input(s): INR, PROTIME in the last 168 hours. Cardiac Enzymes: No results for input(s): CKTOTAL, CKMB, CKMBINDEX, TROPONINI in the last 168 hours. BNP (last 3 results) No results for input(s): PROBNP in the last 8760 hours. HbA1C: No results for input(s): HGBA1C in the last 72 hours. CBG: Recent Labs  Lab 09/27/23 1554 09/28/23 0514  GLUCAP 156* 159*   Lipid Profile: No results for input(s): CHOL, HDL, LDLCALC, TRIG, CHOLHDL, LDLDIRECT in the last 72 hours. Thyroid  Function Tests: No results for input(s): TSH, T4TOTAL, FREET4, T3FREE, THYROIDAB in the last 72 hours. Anemia Panel: No results for input(s): VITAMINB12, FOLATE, FERRITIN, TIBC, IRON, RETICCTPCT in the last 72 hours. Most Recent Urinalysis On File:     Component Value Date/Time   COLORURINE AMBER (A) 09/27/2023 1742   APPEARANCEUR CLOUDY (A) 09/27/2023 1742   APPEARANCEUR Cloudy 07/07/2013 1810   LABSPEC 1.021 09/27/2023 1742   LABSPEC 1.018 07/07/2013 1810   PHURINE 5.0 09/27/2023 1742   GLUCOSEU >=500 (A) 09/27/2023 1742   GLUCOSEU Negative 07/07/2013 1810   HGBUR NEGATIVE 09/27/2023 1742   BILIRUBINUR NEGATIVE  09/27/2023 1742   BILIRUBINUR Negative 07/07/2013 1810   KETONESUR NEGATIVE 09/27/2023 1742   PROTEINUR >=300 (A) 09/27/2023 1742   NITRITE NEGATIVE 09/27/2023 1742   LEUKOCYTESUR LARGE (A) 09/27/2023 1742   LEUKOCYTESUR Trace 07/07/2013 1810   Sepsis Labs: @LABRCNTIP (procalcitonin:4,lacticidven:4) Microbiology: No results found for this or any previous visit (from the past 240 hours).    Radiology Studies last 3 days: CT Cervical Spine Wo Contrast Result Date: 09/27/2023 CLINICAL DATA:  Neck trauma EXAM: CT CERVICAL SPINE WITHOUT CONTRAST TECHNIQUE: Multidetector CT imaging of the cervical spine was performed without intravenous contrast. Multiplanar CT image reconstructions were also generated. RADIATION DOSE REDUCTION: This exam was performed according to the departmental dose-optimization program which includes automated exposure control, adjustment of the mA and/or kV according to patient size and/or use of iterative reconstruction technique. COMPARISON:  CT 09/29/2014 FINDINGS: Alignment: Straightening of the cervical spine. Trace anterolisthesis C2 on C3 and C7 on T1. Facet alignment is maintained. Skull base and vertebrae: No acute fracture. No primary bone lesion or focal pathologic process. Soft tissues and spinal canal: No prevertebral fluid or swelling. No visible canal hematoma. Disc levels: Advanced disc space narrowing and degenerative change C3 through T1. Bulky anterior osteophytes. Facet degenerative changes at multiple levels with foraminal narrowing. Upper chest: Lung apices are clear Other: None IMPRESSION: Straightening of the cervical spine with advanced degenerative changes. No acute osseous abnormality. Electronically Signed   By: Luke Bun M.D.   On: 09/27/2023 21:21   CT Head Wo Contrast Result Date: 09/27/2023 CLINICAL DATA:  Head trauma EXAM: CT HEAD WITHOUT CONTRAST TECHNIQUE: Contiguous axial images were obtained from the base of the skull through the vertex  without intravenous contrast. RADIATION DOSE REDUCTION: This exam was performed according to the departmental dose-optimization program which includes automated exposure control, adjustment of the mA and/or kV according to patient size and/or use of iterative reconstruction technique. COMPARISON:  CT 12/27/2022, MRI 08/03/2021 FINDINGS: Brain: No acute territorial infarction, hemorrhage or intracranial mass. Mild white matter hypodensity consistent with chronic small vessel disease. Stable  ventricle size Vascular: No hyperdense vessels.  No unexpected calcification Skull: Normal. Negative for fracture or focal lesion. Sinuses/Orbits: No acute finding. Other: None IMPRESSION: 1. No CT evidence for acute intracranial abnormality. 2. Mild chronic small vessel ischemic changes of the white matter. Electronically Signed   By: Luke Bun M.D.   On: 09/27/2023 21:14           Laneta Blunt, DO [facility] 09/28/2023, 3:45 PM    Dictation software may have been used to generate the above note. Typos may occur and escape review in typed/dictated notes. Please contact Dr Blunt directly for clarity if needed.  Staff may message me via secure chat in Epic  but this may not receive an immediate response,  please page me for urgent matters!  If 7PM-7AM, please contact night coverage [email or web address]

## 2023-09-28 NOTE — Assessment & Plan Note (Signed)
-   Will continue patient on IV Rocephin . - Will follow urine culture and sensitivity.

## 2023-09-28 NOTE — H&P (Signed)
 [place]   PATIENT NAME: Theresa Booker    MR#:  [id number]  DATE OF BIRTH:  Nov 30, 1945  DATE OF ADMISSION:  09/27/2023  PRIMARY CARE PHYSICIAN: Adina Buel HERO, MD   Patient is coming from: Home  REQUESTING/REFERRING PHYSICIAN: Willo Dunnings, MD  CHIEF COMPLAINT:   Chief Complaint  Patient presents with   Loss of Consciousness    HISTORY OF PRESENT ILLNESS:  Theresa Booker is a 78 y.o. African-American female with medical history significant for asthma, stage IV chronic kidney disease, COPD, depression, type 2 diabetes mellitus, fibromyalgia, hypertension, dyslipidemia and vitamin D  deficiency, who presented to the emergency room with acute onset of syncope.  She stated that she woke up in the morning to go to the bathroom and was feeling lightheaded and dizzy on her way there.  The next thing she remembers was waking up on the floor in front of her toilet.  It is unclear if she had her head as she does not recall but she did report mild occipital pain and mild neck pain throughout the day.  She felt malaise and generalized weakness and fatigue throughout the day.  She therefore presented to the ED.  She denied any paresthesias or focal muscle weakness.  No urinary or stool incontinence.  No tinnitus or vertigo.  She admits to urinary frequency and urgency with associated dysuria.  No chest pain or palpitations.  No cough or wheezing or dyspnea.  She denies any fever or chills.    ED Course: When the patient came to the ER, respiratory rate was 26 with otherwise normal vital signs.  Labs revealed a BUN of 29 with a creatinine of 2.47 comparable to previous levels in October last year.  Albumin was 3.2 with otherwise unremarkable CMP.  High-sensitivity troponin I was 13.  CBC showed WBCs of 10.8.  UA was positive for UTI.  Urine culture was sent. EKG as reviewed by me : EKG showed normal sinus rhythm with a rate of 84 with inferior Q waves. Imaging: Noncontrasted head CT scan  revealed mild chronic small vessel ischemic changes of the white matter with no acute intracranial abnormality.  C-spine CT showed straightening of the cervical spine with advanced degenerative changes with no acute osseous abnormality.  The patient was given 1 g IV Rocephin .  The patient will be admitted to the medical telemetry observation bed for further evaluation and management. PAST MEDICAL HISTORY:   Past Medical History:  Diagnosis Date   Asthma    Atypical chest pain    neg ETT at [facility]   Body mass index (BMI) 31.0-31.9, adult 04/05/2017   CKD (chronic kidney disease), stage III (HCC)    Closed right ankle fracture    Cocaine abuse, uncomplicated (HCC) 07/24/2013   Depression    Diabetes mellitus without complication (HCC)    Fibromyalgia    HLD (hyperlipidemia)    Hypertension    Mild cognitive impairment 06/21/2014   Stable angina pectoris (HCC) 10/24/2021   Vitamin D  deficiency     PAST SURGICAL HISTORY:   Past Surgical History:  Procedure Laterality Date   ABDOMINAL HYSTERECTOMY     BIOPSY  02/12/2023   Procedure: BIOPSY;  Surgeon: Therisa Bi, MD;  Location: [facility] ENDOSCOPY;  Service: Gastroenterology;;   COLONOSCOPY WITH PROPOFOL  N/A 05/22/2016   Procedure: COLONOSCOPY WITH PROPOFOL ;  Surgeon: Gladis RAYMOND Mariner, MD;  Location: [facility] ENDOSCOPY;  Service: Endoscopy;  Laterality: N/A;   COLONOSCOPY WITH PROPOFOL  N/A 02/12/2023  Procedure: COLONOSCOPY WITH PROPOFOL ;  Surgeon: Therisa Bi, MD;  Location: [facility] ENDOSCOPY;  Service: Gastroenterology;  Laterality: N/A;   COLONOSCOPY WITH PROPOFOL  N/A 04/02/2023   Procedure: COLONOSCOPY WITH PROPOFOL ;  Surgeon: Therisa Bi, MD;  Location: [facility] ENDOSCOPY;  Service: Gastroenterology;  Laterality: N/A;   ESOPHAGOGASTRODUODENOSCOPY (EGD) WITH PROPOFOL  N/A 05/22/2016   Procedure: ESOPHAGOGASTRODUODENOSCOPY (EGD) WITH PROPOFOL ;  Surgeon: Gladis RAYMOND Mariner, MD;  Location[facility] ENDOSCOPY;  Service: Endoscopy;  Laterality: N/A;    ESOPHAGOGASTRODUODENOSCOPY (EGD) WITH PROPOFOL  N/A 02/12/2023   Procedure: ESOPHAGOGASTRODUODENOSCOPY (EGD) WITH PROPOFOL ;  Surgeon: Therisa Bi, MD;  Location: [facility] ENDOSCOPY;  Service: Gastroenterology;  Laterality: N/A;   HAND SURGERY Right 2014   Dr Kathlynn   HEMOSTASIS CLIP PLACEMENT  04/02/2023   Procedure: HEMOSTASIS CLIP PLACEMENT;  Surgeon: Therisa Bi, MD;  Location: [facility] ENDOSCOPY;  Service: Gastroenterology;;   POLYPECTOMY  04/02/2023   Procedure: POLYPECTOMY;  Surgeon: Therisa Bi, MD;  Location: [facility] ENDOSCOPY;  Service: Gastroenterology;;   rotator cuff surgery Left 03/2013   DR Kathlynn   SHOULDER ARTHROSCOPY WITH OPEN ROTATOR CUFF REPAIR Right 04/25/2017   Procedure: SHOULDER ARTHROSCOPY WITH OPEN ROTATOR CUFF REPAIR;  Surgeon: Edie Norleen PARAS, MD;  Location: [facility] ORS;  Service: Orthopedics;  Laterality: Right;   SHOULDER ARTHROSCOPY WITH OPEN ROTATOR CUFF REPAIR Right 03/11/2018   Procedure: SHOULDER ARTHROSCOPY WITH Recurrent  OPEN ROTATOR CUFF REPAIR;  Surgeon: Edie Norleen PARAS, MD;  Location: [facility] ORS;  Service: Orthopedics;  Laterality: Right;  debridment and decompression    SOCIAL HISTORY:   Social History   Tobacco Use   Smoking status: Never   Smokeless tobacco: Never  Substance Use Topics   Alcohol use: No    Alcohol/week: 0.0 standard drinks of alcohol    FAMILY HISTORY:   Family History  Problem Relation Age of Onset   Diabetes Mother    Breast cancer Mother 72   Cancer Sister    Cancer Father    Liver disease Brother    Breast cancer Maternal Aunt    Breast cancer Cousin        maternal    DRUG ALLERGIES:   Allergies  Allergen Reactions   Ace Inhibitors Other (See Comments) and Cough    Constant persistent cough   Bee Pollen Other (See Comments)    Sneezing/watery eyes/itchy eye   Pollen Extracts [Pollen Extract] Other (See Comments)    Sneezing/watery eyes/itchy eye   Vicodin [Hydrocodone -Acetaminophen ] Nausea And Vomiting    REVIEW OF SYSTEMS:    ROS As per history of present illness. All pertinent systems were reviewed above. Constitutional, HEENT, cardiovascular, respiratory, GI, GU, musculoskeletal, neuro, psychiatric, endocrine, integumentary and hematologic systems were reviewed and are otherwise negative/unremarkable except for positive findings mentioned above in the HPI.   MEDICATIONS AT HOME:   Prior to Admission medications   Medication Sig Start Date End Date Taking? Authorizing Provider  acetaminophen  (TYLENOL ) 325 MG tablet Take 2 tablets (650 mg total) by mouth every 6 (six) hours as needed. 08/21/22  Yes Dougherty, Lauren A, PA-C  albuterol  (VENTOLIN  HFA) 108 (90 Base) MCG/ACT inhaler Inhale 2 puffs into the lungs every 6 (six) hours as needed for wheezing or shortness of breath.    Yes [provider]  aspirin  EC 81 MG tablet Take 81 mg by mouth daily. 03/02/20  Yes [provider]  Cholecalciferol  (VITAMIN D3) 50 MCG (2000 UT) TABS Take 2,000 Units by mouth daily.   Yes [provider]  donepezil  (ARICEPT ) 10 MG tablet Take  10 mg by mouth at bedtime. 09/28/15  Yes [provider]  ezetimibe  (ZETIA ) 10 MG tablet Take 10 mg by mouth daily. 06/06/20  Yes [provider]  FEROSUL 325 (65 Fe) MG tablet Take 325 mg by mouth daily with breakfast. 07/20/21  Yes [provider]  fluticasone  furoate-vilanterol (BREO ELLIPTA ) 100-25 MCG/INH AEPB Inhale 1 puff into the lungs daily. 01/22/19  Yes [provider]  furosemide  (LASIX ) 40 MG tablet Take 1 tablet (40 mg total) by mouth 3 (three) times a week. 08/07/21  Yes Tobie Yetta HERO, MD  gabapentin  (NEURONTIN ) 300 MG capsule Take by mouth 3 (three) times daily. 04/02/16  Yes [provider]  insulin  glargine (LANTUS ) 100 UNIT/ML Solostar Pen Inject 20 Units into the skin daily. 08/05/21  Yes Tobie Yetta HERO, MD  isosorbide  mononitrate (IMDUR ) 30 MG 24 hr tablet Take 30 mg by mouth daily.   Yes [provider]   JARDIANCE 25 MG TABS tablet Take 25 mg by mouth daily. 09/17/23  Yes [provider]  loratadine  (CLARITIN ) 10 MG tablet Take 10 mg by mouth daily.   Yes [provider]  Multiple Vitamin (MULTIVITAMIN WITH MINERALS) TABS tablet Take 1 tablet by mouth daily. 07/31/18  Yes Salary, Nemiah BIRCH, MD  nortriptyline  (PAMELOR ) 50 MG capsule Take 50 mg by mouth at bedtime.   Yes [provider]  Olopatadine  HCl 0.2 % SOLN  09/20/21  Yes [provider]  omeprazole  (PRILOSEC) 20 MG capsule Take 20 mg by mouth daily. 01/21/23  Yes [provider]  omeprazole  (PRILOSEC) 40 MG capsule Take 1 capsule (40 mg total) by mouth daily. 01/01/23 09/28/23 Yes [place], PA-C  sitaGLIPtin (JANUVIA) 100 MG tablet Take 100 mg by mouth daily.   Yes [provider]  tiotropium (SPIRIVA ) 18 MCG inhalation capsule Place 18 mcg into inhaler and inhale daily.   Yes [provider]  venlafaxine  XR (EFFEXOR -XR) 75 MG 24 hr capsule Take 225 mg by mouth daily with breakfast.   Yes [provider]  blood glucose meter kit and supplies KIT Dispense based on patient and insurance preference. Use up to four times daily as directed. (FOR ICD-9 250.00, 250.01). 08/11/18   Almeda Bernard, MD  HYDROcodone -acetaminophen  (NORCO/VICODIN) 5-325 MG tablet Take 2 tablets by mouth every 6 (six) hours as needed for moderate pain (pain score 4-6). Patient not taking: Reported on 04/02/2023 12/31/22   [provider]  RELION PEN NEEDLE 31G/8MM 31G X 8 MM MISC USE AS DIRECTED FOR INSULIN  INJECTIONS    [provider]  TRUEPLUS PEN NEEDLES 31G X 6 MM MISC  10/12/22   [provider]      VITAL SIGNS:  Blood pressure 117/78, pulse 82, temperature 98.4 F (36.9 C), temperature source Oral, resp. rate 18, height 5' 1 (1.549 m), weight 73.7 kg, SpO2 97%.  PHYSICAL EXAMINATION:  Physical Exam  GENERAL:  78 y.o.-year-old African-American female  patient lying in the bed with no acute distress.  EYES: Pupils equal, round, reactive to light and accommodation. No scleral icterus. Extraocular muscles intact.  HEENT: Head atraumatic, normocephalic. Oropharynx and nasopharynx clear.  NECK:  Supple, no jugular venous distention. No thyroid  enlargement, no tenderness.  LUNGS: Normal breath sounds bilaterally, no wheezing, rales,rhonchi or crepitation. No use of accessory muscles of respiration.  CARDIOVASCULAR: Regular rate and rhythm, S1, S2 normal. No murmurs, rubs, or gallops.  ABDOMEN: Soft, nondistended, nontender. Bowel sounds present. No organomegaly or mass.  EXTREMITIES: No pedal  edema, cyanosis, or clubbing.  NEUROLOGIC: Cranial nerves II through XII are intact. Muscle strength 5/5 in all extremities. Sensation intact. Gait not checked.  PSYCHIATRIC: The patient is alert and oriented x 3.  Normal affect and good eye contact. SKIN: No obvious rash, lesion, or ulcer.   LABORATORY PANEL:   CBC Recent Labs  Lab 09/27/23 1748  WBC 10.8*  HGB 12.7  HCT 40.3  PLT 330   ------------------------------------------------------------------------------------------------------------------  Chemistries  Recent Labs  Lab 09/27/23 1748  NA 140  K 4.5  CL 103  CO2 26  GLUCOSE 118*  BUN 29*  CREATININE 2.47*  CALCIUM  9.0  AST 17  ALT 13  ALKPHOS 78  BILITOT 0.6   ------------------------------------------------------------------------------------------------------------------  Cardiac Enzymes No results for input(s): TROPONINI in the last 168 hours. ------------------------------------------------------------------------------------------------------------------  RADIOLOGY:  CT Cervical Spine Wo Contrast Result Date: 09/27/2023 CLINICAL DATA:  Neck trauma EXAM: CT CERVICAL SPINE WITHOUT CONTRAST TECHNIQUE: Multidetector CT imaging of the cervical spine was performed without intravenous contrast. Multiplanar CT image  reconstructions were also generated. RADIATION DOSE REDUCTION: This exam was performed according to the departmental dose-optimization program which includes automated exposure control, adjustment of the mA and/or kV according to patient size and/or use of iterative reconstruction technique. COMPARISON:  CT 09/29/2014 FINDINGS: Alignment: Straightening of the cervical spine. Trace anterolisthesis C2 on C3 and C7 on T1. Facet alignment is maintained. Skull base and vertebrae: No acute fracture. No primary bone lesion or focal pathologic process. Soft tissues and spinal canal: No prevertebral fluid or swelling. No visible canal hematoma. Disc levels: Advanced disc space narrowing and degenerative change C3 through T1. Bulky anterior osteophytes. Facet degenerative changes at multiple levels with foraminal narrowing. Upper chest: Lung apices are clear Other: None IMPRESSION: Straightening of the cervical spine with advanced degenerative changes. No acute osseous abnormality. Electronically Signed   By: Luke Bun M.D.   On: 09/27/2023 21:21   CT Head Wo Contrast Result Date: 09/27/2023 CLINICAL DATA:  Head trauma EXAM: CT HEAD WITHOUT CONTRAST TECHNIQUE: Contiguous axial images were obtained from the base of the skull through the vertex without intravenous contrast. RADIATION DOSE REDUCTION: This exam was performed according to the departmental dose-optimization program which includes automated exposure control, adjustment of the mA and/or kV according to patient size and/or use of iterative reconstruction technique. COMPARISON:  CT 12/27/2022, MRI 08/03/2021 FINDINGS: Brain: No acute territorial infarction, hemorrhage or intracranial mass. Mild white matter hypodensity consistent with chronic small vessel disease. Stable ventricle size Vascular: No hyperdense vessels.  No unexpected calcification Skull: Normal. Negative for fracture or focal lesion. Sinuses/Orbits: No acute finding. Other: None IMPRESSION: 1. No  CT evidence for acute intracranial abnormality. 2. Mild chronic small vessel ischemic changes of the white matter. Electronically Signed   By: Luke Bun M.D.   On: 09/27/2023 21:14      IMPRESSION AND PLAN:  Assessment and Plan: * Syncope and collapse - The patient will be admitted to an observation medically monitored bed. - Will check orthostatics q 12 hours. - Will obtain a bilateral carotid Doppler and 2D echo. - The patient will be gently hydrated with IV normal saline and monitored for arrhythmias. -Differential diagnoses would include neurally mediated syncope, cardiogenic, arrhythmias related,  orthostatic hypotension and less likely hypoglycemia.    Acute lower UTI - Will continue patient on IV Rocephin . - Will follow urine culture and sensitivity.  Type 2 diabetes mellitus with peripheral neuropathy (HCC) - The patient will be placed on supplemental coverage  with NovoLog . - Will continue basal coverage. - Will continue Januvia. - Will continue Neurontin .  GERD without esophagitis - Will continue PPI therapy.  Chronic obstructive pulmonary disease (COPD) (HCC) - Will continue her inhalers.  Depression - Will continue Effexor  XR and hold off Pamelor .   DVT prophylaxis: Lovenox .  Advanced Care Planning:  Code Status: full code.  Family Communication:  The plan of care was discussed in details with the patient (and family). I answered all questions. The patient agreed to proceed with the above mentioned plan. Further management will depend upon hospital course. Disposition Plan: Back to previous home environment Consults called: none.  All the records are reviewed and case discussed with ED provider.  Status is: Observation  I certify that at the time of admission, it is my clinical judgment that the patient will require  hospital care extending less than 2 midnights.                            Dispo: The patient is from: Home              Anticipated d/c is  to: Home              Patient currently is not medically stable to d/c.              Difficult to place patient: No  Madison DELENA Peaches M.D on 09/28/2023 at 3:01 AM  [facility]   From 7 PM-7 AM, contact night-coverage [email or web address]  CC: Primary care physician; Adina Buel HERO, MD

## 2023-09-28 NOTE — Assessment & Plan Note (Signed)
-   Will continue Effexor  XR and hold off Pamelor .

## 2023-09-28 NOTE — Assessment & Plan Note (Addendum)
-   The patient will be placed on supplemental coverage with NovoLog . - Will continue basal coverage. - Will continue Januvia. - Will continue Neurontin .

## 2023-09-28 NOTE — Discharge Instructions (Addendum)
 Housing Resources  1) [employer] Housing- [phone]  2) SleepsAround.co.za  3) ClosetRepublicans.fi  4)https://rdmfhrentals.RingRipper.nl.jsp?st=[place]&state_name=[place]&st_[email or web address]  5) [email or web address].[email or web address]-[email or web address]  Income based Housing [facility]    [address], [place], [place] [address]                               [phone] [facility] Trace Phase II        [address], [place] [address]                               [phone] [facility] Apartments  [address] [place], [place] [address]                                    [phone] [address]       [address] [place] [address]                              [phone]              [address], [place], [place] [address]                      [phone]   [facility] HOMES [address] [place], [place] [address] [phone]  [facility] and [employer] [phone] ext. [phone] or [phone]  [facility] COMMUNITIES I & II [address] [place], [place] [address]   [facility] [address] [place] , [place] [address] [phone] [email or web address] .[facility] Housing [place]   [phone] ext. [phone] [address]   [phone] ext. [phone]   Other Resources  1) https://www.BushWebsites.nl  2) [employer] [facility] [address] [place], [place]  [address]  [phone]  3)Low Income Energy Assistance (LIEAP)- Households including a person aged 58 or older or an individual receiving disability benefits and services through the [place] Division of Aging and Adult Services are eligible to sign up for assistance from Dec. 1 - 31. All other households may apply from Jan. 1 - March 31, or until funds are exhausted. Contact your local Department of Social Services for the application dates and for additional information  on LIEAP. To apply for Energy Assistance online please visit [facility] - ePASS.  4) TanningPads.co.za  5) https://barton-williams.info/  211 is an information and referral service provided by [employer] of [place]  and powered by local [employer] [place] . Families and individuals can call 2-1-1 or [phone] to receive free and confidential information on health and human services within their community.

## 2023-09-28 NOTE — Evaluation (Signed)
 Physical Therapy Evaluation Patient Details Name: Theresa MCVICAR MRN: [id number] DOB: 1945-10-31 Today's Date: 09/28/2023  History of Present Illness  Pt admitted to [facility] on 09/27/23 under observation for c/o syncope and collapse. UA positive for UTI. Imaging negative for acute abnormality. Significant PMH includes: CKD (III), GERD, HTN, chronic pain, T2DM, fibromyalgia, asthma, mild cognitive impairment, cocaine abuse, and vitamin D  deficiency   Clinical Impression  Pt agreeable for PT eval; received sitting EOB eating breakfast. At baseline, pt is mod I for ADL's and ambulation with SPC and RW. Family assists with transportation, IADL's, and reminding pt to take her medication.  Pt presents with positive/symptomatic orthostatic hypotension, decreased activity tolerance, and decreased standing balance, resulting in impaired functional mobility from baseline. Due to deficits, pt required SBA for bed mobility, transfers, and gait in room with RW. Mild lightheadedness with positive orthostatics; see below.   09/28/23 0850  Orthostatic Lying   BP- Lying 145/90  Pulse- Lying 78  Orthostatic Sitting  BP- Sitting 114/84  Pulse- Sitting 79  Orthostatic Standing at 0 minutes  BP- Standing at 0 minutes 110/73  Pulse- Standing at 0 minutes 84  Orthostatic Standing at 3 minutes  BP- Standing at 3 minutes 128/81  Pulse - Standing at 3 minutes 89   Deficits limit the pt's ability to safely and independently perform ADL's, transfer, and ambulate. Pt will benefit from acute skilled PT services to address deficits for return to baseline function. Pt will benefit from post acute therapy services to address deficits for return to baseline function.  Encourage OOB mobility with nursing and therapy mobility tech for meals, toileting, and ambulation for continued progress towards goals and maintenance of IND with functional mobility.          If plan is discharge home, recommend the following:  Assistance with cooking/housework;Help with stairs or ramp for entrance;A little help with bathing/dressing/bathroom     Equipment Recommendations Rolling walker (2 wheels) (shower chair)     Functional Status Assessment Patient has had a recent decline in their functional status and demonstrates the ability to make significant improvements in function in a reasonable and predictable amount of time.     Precautions / Restrictions Precautions Precautions: Fall Restrictions Weight Bearing Restrictions Per Provider Order: No Other Position/Activity Restrictions: orthostatics      Mobility  Bed Mobility               General bed mobility comments: SBA for safety to sit EOB, HOB flat, use of BUE for support, increased time/effort    Transfers                   General transfer comment: SBA for safety for STS transfers at EOB and recliner with RW; multimodal cues for safety, sequencing, hand placement, and proper RW proximity. Demonstrates Good eccentric lowering with proper hand placement.    Ambulation/Gait   Gait Distance (Feet): 40 Feet           General Gait Details: SBA for safety to ambulate in room with RW. Demonstrates slowed cadence, decreased step length/foot clearance bilaterally, and increased time for turns.      Balance Overall balance assessment: Needs assistance   Sitting balance-Leahy Scale: Normal Sitting balance - Comments: difficulty donning/doffing socks due to back and neck pain from fall, no LOB     Standing balance-Leahy Scale: Fair Standing balance comment: standing balance in RW  Pertinent Vitals/Pain Pain Assessment Pain Assessment: 0-10 Pain Score: 7  Pain Location: L side of back and L neck pain Pain Descriptors / Indicators: Aching, Dull Pain Intervention(s): Monitored during session, Repositioned    Home Living Family/patient expects to be discharged to:: Private  residence Living Arrangements: Children (son, DIL, grandchildren) Available Help at Discharge: Family;Available PRN/intermittently;Available 24 hours/day (son and DIL work, grandkids in school. DIL father would be available 24/7) Type of Home: Apartment (1st floor) Home Access: Level entry       Home Layout: One level Home Equipment: Agricultural consultant (2 wheels);Cane - single point;BSC/3in1;Shower seat Additional Comments: Pt has moved a lot recently and does not know where most equipment is (sh/ch and BSC)    Prior Function Prior Level of Function : History of Falls (last six months)             Mobility Comments: Questionable historian of health due to mild cog deficit at baseline. Ambulates with RW sometimes around the house, otherwise will use SPC (75% of time) in the R hand. Does not drive ADLs Comments: Mod I for ADL's. Family assists with IADL's but she is able to make herself simple meals if needed. Set up assist for medication management; needs PRN assist for reminders to take it.     Extremity/Trunk Assessment   Upper Extremity Assessment Upper Extremity Assessment: Overall WFL for tasks assessed    Lower Extremity Assessment Lower Extremity Assessment: Overall WFL for tasks assessed          Cognition Arousal: Alert Behavior During Therapy: WFL for tasks assessed/performed   PT - Cognitive impairments: History of cognitive impairments                       PT - Cognition Comments: mild cog deficit at baseline         Cueing       General Comments General comments (skin integrity, edema, etc.): orthostatics positive with 31pt SBP drop from sup>sit, mild dizziness throughout session    Exercises Other Exercises Other Exercises: Pt educated re: PT role/POC, DC recommendations, safety with functional mobility, use of DME for energy conservation/decreased fall risk, orthostatics, call for help, OOB to chair for meals, amb to/from bathroom, amb with  nursing/mobility tech. She verbalized understanding.   Assessment/Plan    PT Assessment Patient needs continued PT services  PT Problem List Decreased activity tolerance;Decreased balance;Decreased mobility;Cardiopulmonary status limiting activity       PT Treatment Interventions DME instruction;Gait training;Functional mobility training;Therapeutic exercise;Therapeutic activities;Balance training;Neuromuscular re-education    PT Goals (Current goals can be found in the Care Plan section)  Acute Rehab PT Goals Patient Stated Goal: go home PT Goal Formulation: With patient Time For Goal Achievement: 10/12/23 Potential to Achieve Goals: Good    Frequency Min 1X/week        AM-PAC PT 6 Clicks Mobility  Outcome Measure Help needed turning from your back to your side while in a flat bed without using bedrails?: A Little Help needed moving from lying on your back to sitting on the side of a flat bed without using bedrails?: A Little Help needed moving to and from a bed to a chair (including a wheelchair)?: A Little Help needed standing up from a chair using your arms (e.g., wheelchair or bedside chair)?: A Little Help needed to walk in hospital room?: A Little Help needed climbing 3-5 steps with a railing? : A Little 6 Click Score: 18  End of Session Equipment Utilized During Treatment: Gait belt Activity Tolerance: Patient tolerated treatment well Patient left: in chair;with call bell/phone within reach;with chair alarm set Nurse Communication: Mobility status PT Visit Diagnosis: Unsteadiness on feet (R26.81);History of falling (Z91.81);Dizziness and giddiness (R42)    Time: 9158-9081 PT Time Calculation (min) (ACUTE ONLY): 37 min   Charges:   PT Evaluation $PT Eval Moderate Complexity: 1 Mod PT Treatments $Therapeutic Activity: 8-22 mins PT General Charges $$ ACUTE PT VISIT: 1 Visit        Camie CHARLENA Kluver, PT, DPT 12:14 PM,09/28/23 Physical Therapist - [facility]

## 2023-09-28 NOTE — Progress Notes (Signed)
 PHARMACIST - PHYSICIAN COMMUNICATION  CONCERNING:  Enoxaparin  (Lovenox ) for DVT Prophylaxis    RECOMMENDATION: Patient was prescribed enoxaprin 40mg  q24 hours for VTE prophylaxis.   Filed Weights   09/27/23 1741 09/27/23 2327  Weight: 76.5 kg (168 lb 10.4 oz) 73.7 kg (162 lb 7.7 oz)    Body mass index is 30.7 kg/m.  Estimated Creatinine Clearance: 16.7 mL/min (A) (by C-G formula based on SCr of 2.47 mg/dL (H)).  Patient is candidate for enoxaparin  30mg  every 24 hours based on CrCl <67ml/min or Weight <45kg  DESCRIPTION: Pharmacy has adjusted enoxaparin  dose per [facility] policy.  Patient is now receiving enoxaparin  30 mg every 24 hours   Rankin CANDIE Dills, PharmD, [facility] 09/28/2023 1:27 AM

## 2023-09-28 NOTE — Progress Notes (Signed)
   Patient Saturations on Room Air at Rest = 99%  Patient Saturations on [employer] while Ambulating = 94%

## 2023-09-28 NOTE — Care Management Obs Status (Signed)
 MEDICARE OBSERVATION STATUS NOTIFICATION   Patient Details  Name: ALBA PERILLO MRN: [id number] Date of Birth: [birth year]   Medicare Observation Status Notification Given:  Yes    Rojelio SHAUNNA Rattler 09/28/2023, 10:20 AM

## 2023-09-28 NOTE — Hospital Course (Addendum)
 Hospital course / significant events:   HPI: Theresa Booker is a 78 y.o. African-American female with medical history significant for asthma, stage IV chronic kidney disease, COPD, depression, type 2 diabetes mellitus, fibromyalgia, hypertension, dyslipidemia and vitamin D  deficiency, who presented to the emergency room with syncope. She stated that she woke up in the morning to go to the bathroom and was feeling lightheaded and dizzy on her way there. The next thing she remembers was waking up on the floor in front of her toilet.   07/25: to ED. UA was positive for UTI. Noncontrasted head CT scan revealed mild chronic small vessel ischemic changes of the white matter with no acute intracranial abnormality. C-spine CT showed straightening of the cervical spine with advanced degenerative changes  07/26: orthostatic hypotension, persistent AKI, remain on gentle IV fluids     Consultants:  none  Procedures/Surgeries: none      ASSESSMENT & PLAN:   Syncope and collapse Differential diagnoses would include orthostatic or vasovagal event in context of UTI, neurally mediated syncope, cardiogenic, arrhythmias related,  orthostatic hypotension and less likely hypoglycemia. Carotid doppler 12/2022 no significant stenosis will not repeat test  Echo 12/2023 preserved EF, G1 dias df will not repeat test Orthostatics --> positive  IV normal saline  Telemetry x24h    Acute lower UTI IV Rocephin . urine culture and sensitivity.   AKI on CKD stage 4 Following w/ nephrology outpatient  Cr 08/15/23 was 1.8, Cr on presentation to ED 09/27/23 was 2.62 IV fluids and encourage po intake  Monitor BMP  Type 2 diabetes mellitus with peripheral neuropathy  Resume home insulin   continue Januvia. continue Neurontin . Discontinue Jardiance d/t UTI risk    GERD without esophagitis PPI   Chronic obstructive pulmonary disease (COPD)  Continue bronchodilators. O2 walk test see if needing home oxygen -  does not need    Depression continue Effexor  XR  Restart home Pamelor .    Class 1 obesity based on BMI: Body mass index is 31.49 kg/m.SABRA Significantly low or high BMI is associated with higher medical risk.  Underweight - under 18  overweight - 25 to 29 obese - 30 or more Class 1 obesity: BMI of 30.0 to 34 Class 2 obesity: BMI of 35.0 to 39 Class 3 obesity: BMI of 40.0 to 49 Super Morbid Obesity: BMI 50-59 Super-super Morbid Obesity: BMI 60+ Healthy nutrition and physical activity advised as adjunct to other disease management and risk reduction treatments

## 2023-09-29 DIAGNOSIS — R55 Syncope and collapse: Secondary | ICD-10-CM | POA: Diagnosis not present

## 2023-09-29 LAB — BASIC METABOLIC PANEL WITH GFR
Anion gap: 6 (ref 5–15)
BUN: 30 mg/dL — ABNORMAL HIGH (ref 8–23)
CO2: 24 mmol/L (ref 22–32)
Calcium: 8.4 mg/dL — ABNORMAL LOW (ref 8.9–10.3)
Chloride: 107 mmol/L (ref 98–111)
Creatinine, Ser: 1.8 mg/dL — ABNORMAL HIGH (ref 0.44–1.00)
GFR, Estimated: 28 mL/min — ABNORMAL LOW (ref >60)
Glucose, Bld: 78 mg/dL (ref 70–99)
Potassium: 4.1 mmol/L (ref 3.5–5.1)
Sodium: 137 mmol/L (ref 135–145)

## 2023-09-29 LAB — URINE CULTURE: Culture: >=100000 — AB

## 2023-09-29 LAB — GLUCOSE, CAPILLARY
Glucose-Capillary: 64 mg/dL — ABNORMAL LOW (ref 70–99)
Glucose-Capillary: 70 mg/dL (ref 70–99)

## 2023-09-29 MED ORDER — CEPHALEXIN 500 MG PO CAPS: 500.0000 mg | ORAL_CAPSULE | Freq: Two times a day (BID) | ORAL | 0 refills | Status: AC

## 2023-09-29 MED ORDER — DEXTROSE 50 % IV SOLN: 1.0000 | INTRAVENOUS | Status: DC | PRN

## 2023-09-29 MED ORDER — INSULIN GLARGINE-YFGN 100 UNIT/ML ~~LOC~~ SOLN
15.0000 [IU] | Freq: Every day | SUBCUTANEOUS | Status: DC
  Administered 2023-09-29: 15 [IU] via SUBCUTANEOUS
  Filled 2023-09-29: qty 0.15

## 2023-09-29 MED ORDER — INSULIN GLARGINE 100 UNIT/ML SOLOSTAR PEN: 15.0000 [IU] | PEN_INJECTOR | Freq: Every day | SUBCUTANEOUS | Status: AC

## 2023-09-29 NOTE — Discharge Summary (Signed)
 Physician Discharge Summary   Patient: Theresa Booker MRN: [id number]  DOB: 1945/09/12   Admit:     Date of Admission: 09/27/2023 Admitted from: home   Discharge: Date of discharge: 09/29/23 Disposition: Home health Condition at discharge: good  CODE STATUS: FULL CODE     Discharge Physician: Laneta Blunt, DO [facility]     PCP: Adina Buel HERO, MD  Recommendations for Outpatient Follow-up:  Follow up with PCP Adina Buel HERO, MD in 1-2 weeks    Discharge Instructions     Diet Carb Modified   Complete by: As directed    Increase activity slowly   Complete by: As directed          Discharge Diagnoses: Principal Problem:   Syncope and collapse Active Problems:   Acute lower UTI   Type 2 diabetes mellitus with peripheral neuropathy (HCC)   Depression   Chronic obstructive pulmonary disease (COPD) (HCC)   GERD without esophagitis   Syncope       Hospital course / significant events:   HPI: [name] is a 78 y.o. African-American female with medical history significant for asthma, stage IV chronic kidney disease, COPD, depression, type 2 diabetes mellitus, fibromyalgia, hypertension, dyslipidemia and vitamin D  deficiency, who presented to the emergency room with syncope. She stated that she woke up in the morning to go to the bathroom and was feeling lightheaded and dizzy on her way there. The next thing she remembers was waking up on the floor in front of her toilet.   07/25: to ED. UA was positive for UTI. Noncontrasted head CT scan revealed mild chronic small vessel ischemic changes of the white matter with no acute intracranial abnormality. C-spine CT showed straightening of the cervical spine with advanced degenerative changes  07/26: orthostatic hypotension, persistent AKI, remain on gentle IV fluids 07/27: Cr back to baseline, UCx resulted as below. Stable for discharge      Consultants:   none  Procedures/Surgeries: none       ASSESSMENT & PLAN:   Syncope and collapse Differential diagnoses would include orthostatic or vasovagal event in context of UTI, neurally mediated syncope, cardiogenic, arrhythmias related,  orthostatic hypotension and less likely hypoglycemia. Carotid doppler 12/2022 no significant stenosis will not repeat test  Echo 12/2023 preserved EF, G1 dias df will not repeat test Orthostatics --> positive  IV normal saline --> po hydration     Acute lower UTI d/t Ecoli  IV Rocephin  --> po keflex     AKI on CKD stage 4 - resolved  Following w/ nephrology outpatient  Cr 08/15/23 was 1.8, Cr on presentation to ED 09/27/23 was 2.62 IV fluids -->  encourage po intake  Monitor BMP Follow outpatient   Type 2 diabetes mellitus with peripheral neuropathy  Resume home insulin   continue Januvia. continue Neurontin . Discontinue Jardiance d/t UTI risk    GERD without esophagitis PPI   Chronic obstructive pulmonary disease (COPD)  Continue bronchodilators. O2 walk test see if needing home oxygen - does not need    Depression continue Effexor  XR  Restart home Pamelor .    Class 1 obesity based on BMI: Body mass index is 31.49 kg/m.SABRA Significantly low or high BMI is associated with higher medical risk.  Underweight - under 18  overweight - 25 to 29 obese - 30 or more Class 1 obesity: BMI of 30.0 to 34 Class 2 obesity: BMI of 35.0 to 39 Class 3 obesity: BMI of 40.0 to 49 Super Morbid  Obesity: BMI 50-59 Super-super Morbid Obesity: BMI 60+ Healthy nutrition and physical activity advised as adjunct to other disease management and risk reduction treatments             Discharge Instructions  Allergies as of 09/29/2023       Reactions   Ace Inhibitors Other (See Comments), Cough   Constant persistent cough   Bee Pollen Other (See Comments)   Sneezing/watery eyes/itchy eye   Pollen Extracts [pollen Extract] Other (See Comments)    Sneezing/watery eyes/itchy eye   Vicodin [hydrocodone -acetaminophen ] Nausea And Vomiting        Medication List     STOP taking these medications    Jardiance 25 MG Tabs tablet Generic drug: empagliflozin       TAKE these medications    acetaminophen  325 MG tablet Commonly known as: Tylenol  Take 2 tablets (650 mg total) by mouth every 6 (six) hours as needed.   albuterol  108 (90 Base) MCG/ACT inhaler Commonly known as: VENTOLIN  HFA Inhale 2 puffs into the lungs every 6 (six) hours as needed for wheezing or shortness of breath.   aspirin  EC 81 MG tablet Take 81 mg by mouth daily.   blood glucose meter kit and supplies Kit Dispense based on patient and insurance preference. Use up to four times daily as directed. (FOR ICD-9 250.00, 250.01).   cephALEXin  500 MG capsule Commonly known as: KEFLEX  Take 1 capsule (500 mg total) by mouth 2 (two) times daily for 3 days.   donepezil  10 MG tablet Commonly known as: ARICEPT  Take 10 mg by mouth at bedtime.   ezetimibe  10 MG tablet Commonly known as: ZETIA  Take 10 mg by mouth daily.   FeroSul 325 (65 Fe) MG tablet Generic drug: ferrous sulfate  Take 325 mg by mouth daily with breakfast.   fluticasone  furoate-vilanterol 100-25 MCG/INH Aepb Commonly known as: BREO ELLIPTA  Inhale 1 puff into the lungs daily.   furosemide  40 MG tablet Commonly known as: LASIX  Take 1 tablet (40 mg total) by mouth 3 (three) times a week.   gabapentin  300 MG capsule Commonly known as: NEURONTIN  Take by mouth 3 (three) times daily.   HYDROcodone -acetaminophen  5-325 MG tablet Commonly known as: NORCO/VICODIN Take 2 tablets by mouth every 6 (six) hours as needed for moderate pain (pain score 4-6).   insulin  glargine 100 UNIT/ML Solostar Pen Commonly known as: LANTUS  Inject 15 Units into the skin daily. What changed: how much to take   isosorbide  mononitrate 30 MG 24 hr tablet Commonly known as: IMDUR  Take 30 mg by mouth daily.    loratadine  10 MG tablet Commonly known as: CLARITIN  Take 10 mg by mouth daily.   multivitamin with minerals Tabs tablet Take 1 tablet by mouth daily.   nortriptyline  50 MG capsule Commonly known as: PAMELOR  Take 50 mg by mouth at bedtime.   Olopatadine  HCl 0.2 % Soln   omeprazole  40 MG capsule Commonly known as: PRILOSEC Take 1 capsule (40 mg total) by mouth daily. What changed: Another medication with the same name was removed. Continue taking this medication, and follow the directions you see here.   RELION PEN NEEDLE 31G/8MM 31G X 8 MM Misc Generic drug: Insulin  Pen Needle USE AS DIRECTED FOR INSULIN  INJECTIONS   TRUEplus Pen Needles 31G X 6 MM Misc Generic drug: Insulin  Pen Needle   sitaGLIPtin 100 MG tablet Commonly known as: JANUVIA Take 100 mg by mouth daily.   tiotropium 18 MCG inhalation capsule Commonly known as: SPIRIVA  Place 18 mcg  into inhaler and inhale daily.   venlafaxine  XR 75 MG 24 hr capsule Commonly known as: EFFEXOR -XR Take 225 mg by mouth daily with breakfast.   Vitamin D3 50 MCG (2000 UT) Tabs Take 2,000 Units by mouth daily.               Durable Medical Equipment  (From admission, onward)           Start     Ordered   09/29/23 1204  DME Walker  Once       Question Answer Comment  Walker: With 5 Inch Wheels   Patient needs a walker to treat with the following condition Debilitated      09/29/23 1204   09/29/23 1204  DME Shower stool  Once        09/29/23 1204              Allergies  Allergen Reactions   Ace Inhibitors Other (See Comments) and Cough    Constant persistent cough   Bee Pollen Other (See Comments)    Sneezing/watery eyes/itchy eye   Pollen Extracts [Pollen Extract] Other (See Comments)    Sneezing/watery eyes/itchy eye   Vicodin [Hydrocodone -Acetaminophen ] Nausea And Vomiting     Subjective: pt feeling well this mroning, ambulating a bit better, eager for discharge home. No new concerns today     Discharge Exam: BP (!) 142/90 (BP Location: Left Arm)   Pulse 73   Temp 98.6 F (37 C)   Resp 16   Ht 5' 1 (1.549 m)   Wt 77.2 kg   SpO2 100%   BMI 32.16 kg/m  General: Pt is alert, awake, not in acute distress Cardiovascular: RRR, S1/S2 +, no rubs, no gallops Respiratory: CTA bilaterally, no wheezing, no rhonchi Abdominal: Soft, NT, ND, bowel sounds + Extremities: no edema, no cyanosis     The results of significant diagnostics from this hospitalization (including imaging, microbiology, ancillary and laboratory) are listed below for reference.     Microbiology: Recent Results (from the past 240 hours)  Urine Culture     Status: Abnormal   Collection Time: 09/27/23  5:42 PM   Specimen: Urine, Clean Catch  Result Value Ref Range Status   Specimen Description   Final    URINE, CLEAN CATCH Performed at [facility], [address] [place], [place] [address]    Special Requests   Final    NONE Performed at [facility], [address] [place], [place] [address]    Culture >=100,000 COLONIES/mL ESCHERICHIA COLI (A)  Final   Report Status 09/29/2023 FINAL  Final   Organism ID, Bacteria ESCHERICHIA COLI (A)  Final      Susceptibility   Escherichia coli - MIC*    AMPICILLIN 4 SENSITIVE Sensitive     CEFAZOLIN  <=4 SENSITIVE Sensitive     CEFEPIME  <=0.12 SENSITIVE Sensitive     CEFTRIAXONE  <=0.25 SENSITIVE Sensitive     CIPROFLOXACIN <=0.25 SENSITIVE Sensitive     GENTAMICIN <=1 SENSITIVE Sensitive     IMIPENEM 0.5 SENSITIVE Sensitive     NITROFURANTOIN <=16 SENSITIVE Sensitive     TRIMETH/SULFA <=20 SENSITIVE Sensitive     AMPICILLIN/SULBACTAM <=2 SENSITIVE Sensitive     PIP/TAZO <=4 SENSITIVE Sensitive ug/mL    * >=100,000 COLONIES/mL ESCHERICHIA COLI     Labs: BNP (last 3 results) No results for input(s): BNP in the last 8760 hours. Basic Metabolic Panel: Recent Labs  Lab 09/27/23 1748 09/28/23 0311 09/29/23 0326  NA  140 135 137   K 4.5 4.0 4.1  CL 103 105 107  CO2 26 25 24   GLUCOSE 118* 225* 78  BUN 29* 32* 30*  CREATININE 2.47* 2.62* 1.80*  CALCIUM  9.0 8.4* 8.4*   Liver Function Tests: Recent Labs  Lab 09/27/23 1748  AST 17  ALT 13  ALKPHOS 78  BILITOT 0.6  PROT 8.0  ALBUMIN 3.2*   No results for input(s): LIPASE, AMYLASE in the last 168 hours. No results for input(s): AMMONIA in the last 168 hours. CBC: Recent Labs  Lab 09/27/23 1748 09/28/23 0311  WBC 10.8* 10.5  HGB 12.7 10.9*  HCT 40.3 34.4*  MCV 86.5 86.2  PLT 330 303   Cardiac Enzymes: No results for input(s): CKTOTAL, CKMB, CKMBINDEX, TROPONINI in the last 168 hours. BNP: Invalid input(s): POCBNP CBG: Recent Labs  Lab 09/27/23 1554 09/28/23 0514 09/29/23 0357 09/29/23 0404  GLUCAP 156* 159* 64* 70   D-Dimer No results for input(s): DDIMER in the last 72 hours. Hgb A1c No results for input(s): HGBA1C in the last 72 hours. Lipid Profile No results for input(s): CHOL, HDL, LDLCALC, TRIG, CHOLHDL, LDLDIRECT in the last 72 hours. Thyroid  function studies No results for input(s): TSH, T4TOTAL, T3FREE, THYROIDAB in the last 72 hours.  Invalid input(s): FREET3 Anemia work up No results for input(s): VITAMINB12, FOLATE, FERRITIN, TIBC, IRON, RETICCTPCT in the last 72 hours. Urinalysis    Component Value Date/Time   COLORURINE AMBER (A) 09/27/2023 1742   APPEARANCEUR CLOUDY (A) 09/27/2023 1742   APPEARANCEUR Cloudy 07/07/2013 1810   LABSPEC 1.021 09/27/2023 1742   LABSPEC 1.018 07/07/2013 1810   PHURINE 5.0 09/27/2023 1742   GLUCOSEU >=500 (A) 09/27/2023 1742   GLUCOSEU Negative 07/07/2013 1810   HGBUR NEGATIVE 09/27/2023 1742   BILIRUBINUR NEGATIVE 09/27/2023 1742   BILIRUBINUR Negative 07/07/2013 1810   KETONESUR NEGATIVE 09/27/2023 1742   PROTEINUR >=300 (A) 09/27/2023 1742   NITRITE NEGATIVE 09/27/2023 1742   LEUKOCYTESUR LARGE (A) 09/27/2023 1742    LEUKOCYTESUR Trace 07/07/2013 1810   Sepsis Labs Recent Labs  Lab 09/27/23 1748 09/28/23 0311  WBC 10.8* 10.5   Microbiology Recent Results (from the past 240 hours)  Urine Culture     Status: Abnormal   Collection Time: 09/27/23  5:42 PM   Specimen: Urine, Clean Catch  Result Value Ref Range Status   Specimen Description   Final    URINE, CLEAN CATCH Performed at [facility], [address] [place], [place] [address]    Special Requests   Final    NONE Performed at [facility], [address] [place], [place] [address]    Culture >=100,000 COLONIES/mL ESCHERICHIA COLI (A)  Final   Report Status 09/29/2023 FINAL  Final   Organism ID, Bacteria ESCHERICHIA COLI (A)  Final      Susceptibility   Escherichia coli - MIC*    AMPICILLIN 4 SENSITIVE Sensitive     CEFAZOLIN  <=4 SENSITIVE Sensitive     CEFEPIME  <=0.12 SENSITIVE Sensitive     CEFTRIAXONE  <=0.25 SENSITIVE Sensitive     CIPROFLOXACIN <=0.25 SENSITIVE Sensitive     GENTAMICIN <=1 SENSITIVE Sensitive     IMIPENEM 0.5 SENSITIVE Sensitive     NITROFURANTOIN <=16 SENSITIVE Sensitive     TRIMETH/SULFA <=20 SENSITIVE Sensitive     AMPICILLIN/SULBACTAM <=2 SENSITIVE Sensitive     PIP/TAZO <=4 SENSITIVE Sensitive ug/mL    * >=100,000 COLONIES/mL ESCHERICHIA COLI   Imaging CT Cervical Spine Wo Contrast Result Date:  09/27/2023 CLINICAL DATA:  Neck trauma EXAM: CT CERVICAL SPINE WITHOUT CONTRAST TECHNIQUE: Multidetector CT imaging of the cervical spine was performed without intravenous contrast. Multiplanar CT image reconstructions were also generated. RADIATION DOSE REDUCTION: This exam was performed according to the departmental dose-optimization program which includes automated exposure control, adjustment of the mA and/or kV according to patient size and/or use of iterative reconstruction technique. COMPARISON:  CT 09/29/2014 FINDINGS: Alignment: Straightening of the cervical spine. Trace anterolisthesis  C2 on C3 and C7 on T1. Facet alignment is maintained. Skull base and vertebrae: No acute fracture. No primary bone lesion or focal pathologic process. Soft tissues and spinal canal: No prevertebral fluid or swelling. No visible canal hematoma. Disc levels: Advanced disc space narrowing and degenerative change C3 through T1. Bulky anterior osteophytes. Facet degenerative changes at multiple levels with foraminal narrowing. Upper chest: Lung apices are clear Other: None IMPRESSION: Straightening of the cervical spine with advanced degenerative changes. No acute osseous abnormality. Electronically Signed   By: Luke Bun M.D.   On: 09/27/2023 21:21   CT Head Wo Contrast Result Date: 09/27/2023 CLINICAL DATA:  Head trauma EXAM: CT HEAD WITHOUT CONTRAST TECHNIQUE: Contiguous axial images were obtained from the base of the skull through the vertex without intravenous contrast. RADIATION DOSE REDUCTION: This exam was performed according to the departmental dose-optimization program which includes automated exposure control, adjustment of the mA and/or kV according to patient size and/or use of iterative reconstruction technique. COMPARISON:  CT 12/27/2022, MRI 08/03/2021 FINDINGS: Brain: No acute territorial infarction, hemorrhage or intracranial mass. Mild white matter hypodensity consistent with chronic small vessel disease. Stable ventricle size Vascular: No hyperdense vessels.  No unexpected calcification Skull: Normal. Negative for fracture or focal lesion. Sinuses/Orbits: No acute finding. Other: None IMPRESSION: 1. No CT evidence for acute intracranial abnormality. 2. Mild chronic small vessel ischemic changes of the white matter. Electronically Signed   By: Luke Bun M.D.   On: 09/27/2023 21:14      Time coordinating discharge: over 30 minutes  SIGNED:  Vadie Principato DO [facility]

## 2023-09-29 NOTE — Progress Notes (Signed)
 Patient is alert and oriented x 4. Discharge instruction given. No any questions or concern at this time. Peripheral I/v removed.

## 2023-09-29 NOTE — Plan of Care (Signed)
  Problem: Education: Goal: Knowledge of condition and prescribed therapy will improve Outcome: Progressing   Problem: Cardiac: Goal: Will achieve and/or maintain adequate cardiac output Outcome: Progressing   Problem: Physical Regulation: Goal: Complications related to the disease process, condition or treatment will be avoided or minimized Outcome: Progressing   Problem: Education: Goal: Knowledge of General Education information will improve Description: Including pain rating scale, medication(s)/side effects and non-pharmacologic comfort measures Outcome: Progressing   Problem: Health Behavior/Discharge Planning: Goal: Ability to manage health-related needs will improve Outcome: Progressing   Problem: Clinical Measurements: Goal: Ability to maintain clinical measurements within normal limits will improve Outcome: Progressing Goal: Will remain free from infection Outcome: Progressing Goal: Diagnostic test results will improve Outcome: Progressing Goal: Respiratory complications will improve Outcome: Progressing Goal: Cardiovascular complication will be avoided Outcome: Progressing   Problem: Activity: Goal: Risk for activity intolerance will decrease Outcome: Progressing   Problem: Safety: Goal: Ability to remain free from injury will improve Outcome: Progressing

## 2023-09-29 NOTE — Plan of Care (Signed)

## 2023-09-29 NOTE — TOC CM/SW Note (Addendum)
..  Transition of Care [facility]) - Inpatient Brief Assessment   Patient Details  Name: Theresa Booker MRN: [id number] Date of Birth: September 13, 1945  [facility] CM/SW Contact:    Edsel DELENA Fischer, LCSW Phone Number: 09/29/2023, 1:54 PM   Clinical Narrative:  SW contacted [employer] for DME order: walker and shower stool.  Referral complete  SW asked pt which HH she wanted. Pt stated that sw could pick agency.  SW submitted referral to [facility]  Transition of Care Asessment:

## 2024-02-25 ENCOUNTER — Encounter: Payer: Self-pay | Admitting: Family Medicine

## 2024-02-25 DIAGNOSIS — Z1231 Encounter for screening mammogram for malignant neoplasm of breast: Secondary | ICD-10-CM

## 2024-03-23 ENCOUNTER — Other Ambulatory Visit: Payer: Self-pay | Admitting: Family Medicine

## 2024-03-23 DIAGNOSIS — Z1231 Encounter for screening mammogram for malignant neoplasm of breast: Secondary | ICD-10-CM

## 2024-03-31 ENCOUNTER — Other Ambulatory Visit: Payer: Self-pay | Admitting: Family Medicine

## 2024-03-31 DIAGNOSIS — Z1231 Encounter for screening mammogram for malignant neoplasm of breast: Secondary | ICD-10-CM
